# Patient Record
Sex: Female | Born: 1940
Health system: Southern US, Community
[De-identification: ages and names within clinical notes are randomized; demographics above are authoritative.]

## PROBLEM LIST (undated history)

## (undated) DIAGNOSIS — R51 Headache: Secondary | ICD-10-CM

## (undated) DIAGNOSIS — R519 Headache, unspecified: Secondary | ICD-10-CM

## (undated) DIAGNOSIS — M1990 Unspecified osteoarthritis, unspecified site: Secondary | ICD-10-CM

## (undated) DIAGNOSIS — I10 Essential (primary) hypertension: Secondary | ICD-10-CM

## (undated) HISTORY — PX: ABDOMINAL HYSTERECTOMY: SHX81

## (undated) HISTORY — PX: APPENDECTOMY: SHX54

---

## 2000-06-25 ENCOUNTER — Ambulatory Visit (HOSPITAL_COMMUNITY): Admission: RE | Admit: 2000-06-25 | Discharge: 2000-06-25 | Payer: Self-pay | Admitting: Family Medicine

## 2001-09-14 ENCOUNTER — Ambulatory Visit (HOSPITAL_COMMUNITY): Admission: RE | Admit: 2001-09-14 | Discharge: 2001-09-14 | Payer: Self-pay | Admitting: Family Medicine

## 2005-05-30 ENCOUNTER — Emergency Department (HOSPITAL_COMMUNITY): Admission: EM | Admit: 2005-05-30 | Discharge: 2005-05-30 | Payer: Self-pay | Admitting: Emergency Medicine

## 2005-06-11 ENCOUNTER — Ambulatory Visit: Payer: Self-pay | Admitting: Gastroenterology

## 2005-07-03 ENCOUNTER — Ambulatory Visit (HOSPITAL_COMMUNITY): Admission: RE | Admit: 2005-07-03 | Discharge: 2005-07-03 | Payer: Self-pay | Admitting: Gastroenterology

## 2005-07-03 ENCOUNTER — Ambulatory Visit: Payer: Self-pay | Admitting: Gastroenterology

## 2005-07-03 ENCOUNTER — Encounter (INDEPENDENT_AMBULATORY_CARE_PROVIDER_SITE_OTHER): Payer: Self-pay | Admitting: Specialist

## 2005-12-25 ENCOUNTER — Ambulatory Visit (HOSPITAL_COMMUNITY): Admission: RE | Admit: 2005-12-25 | Discharge: 2005-12-25 | Payer: Self-pay | Admitting: Gastroenterology

## 2005-12-30 ENCOUNTER — Ambulatory Visit: Payer: Self-pay | Admitting: Gastroenterology

## 2007-07-02 ENCOUNTER — Ambulatory Visit (HOSPITAL_COMMUNITY): Admission: RE | Admit: 2007-07-02 | Discharge: 2007-07-02 | Payer: Self-pay | Admitting: Internal Medicine

## 2008-07-21 ENCOUNTER — Ambulatory Visit (HOSPITAL_COMMUNITY): Admission: RE | Admit: 2008-07-21 | Discharge: 2008-07-21 | Payer: Self-pay | Admitting: Internal Medicine

## 2009-11-02 ENCOUNTER — Ambulatory Visit (HOSPITAL_COMMUNITY): Admission: RE | Admit: 2009-11-02 | Discharge: 2009-11-02 | Payer: Self-pay | Admitting: Internal Medicine

## 2011-02-12 ENCOUNTER — Other Ambulatory Visit (HOSPITAL_COMMUNITY): Payer: Self-pay | Admitting: Internal Medicine

## 2011-02-12 DIAGNOSIS — Z1231 Encounter for screening mammogram for malignant neoplasm of breast: Secondary | ICD-10-CM

## 2011-02-19 ENCOUNTER — Ambulatory Visit (HOSPITAL_COMMUNITY)
Admission: RE | Admit: 2011-02-19 | Discharge: 2011-02-19 | Disposition: A | Discharge status: Home / Self Care | Payer: Medicare Other | Source: Ambulatory Visit | Attending: Internal Medicine | Admitting: Internal Medicine

## 2011-02-19 DIAGNOSIS — Z1231 Encounter for screening mammogram for malignant neoplasm of breast: Secondary | ICD-10-CM

## 2011-10-25 ENCOUNTER — Ambulatory Visit: Payer: Self-pay | Admitting: Family Medicine

## 2011-10-25 ENCOUNTER — Ambulatory Visit: Payer: Self-pay

## 2011-10-25 VITALS — BP 158/76 | HR 60 | Temp 97.8°F | Resp 16 | Ht 61.0 in | Wt 118.0 lb

## 2011-10-25 DIAGNOSIS — M545 Low back pain, unspecified: Secondary | ICD-10-CM

## 2011-10-25 DIAGNOSIS — M549 Dorsalgia, unspecified: Secondary | ICD-10-CM

## 2011-10-25 DIAGNOSIS — G8929 Other chronic pain: Secondary | ICD-10-CM

## 2011-10-25 LAB — POCT CBC
Granulocyte percent: 47.3 %G (ref 37–80)
HCT, POC: 38.8 % (ref 37.7–47.9)
Hemoglobin: 12.1 g/dL — AB (ref 12.2–16.2)
Lymph, poc: 2.3 (ref 0.6–3.4)
MCH, POC: 29.4 pg (ref 27–31.2)
MCHC: 31.2 g/dL — AB (ref 31.8–35.4)
MCV: 94.3 fL (ref 80–97)
MID (cbc): 0.4 (ref 0–0.9)
MPV: 7.7 fL (ref 0–99.8)
POC Granulocyte: 2.4 (ref 2–6.9)
POC LYMPH PERCENT: 44.5 %L (ref 10–50)
POC MID %: 8.2 %M (ref 0–12)
Platelet Count, POC: 334 10*3/uL (ref 142–424)
RBC: 4.11 M/uL (ref 4.04–5.48)
RDW, POC: 14.6 %
WBC: 5.1 10*3/uL (ref 4.6–10.2)

## 2011-10-25 LAB — POCT UA - MICROSCOPIC ONLY
Bacteria, U Microscopic: NEGATIVE
Casts, Ur, LPF, POC: NEGATIVE
Crystals, Ur, HPF, POC: NEGATIVE
Epithelial cells, urine per micros: NEGATIVE
Mucus, UA: NEGATIVE
Yeast, UA: NEGATIVE

## 2011-10-25 LAB — POCT URINALYSIS DIPSTICK
Bilirubin, UA: NEGATIVE
Glucose, UA: NEGATIVE
Ketones, UA: NEGATIVE
Leukocytes, UA: NEGATIVE
Nitrite, UA: NEGATIVE
Protein, UA: NEGATIVE
Spec Grav, UA: 1.02
Urobilinogen, UA: 0.2
pH, UA: 7

## 2011-10-25 LAB — POCT SEDIMENTATION RATE: POCT SED RATE: 20 mm/hr (ref 0–22)

## 2011-10-25 MED ORDER — MELOXICAM 7.5 MG PO TABS: 7.5000 mg | ORAL_TABLET | Freq: Every day | ORAL | Status: DC

## 2011-10-25 NOTE — Patient Instructions (Signed)
Chronic Back Pain When back pain lasts longer than 3 months, it is called chronic back pain.This pain can be frustrating, but the cause of the pain is rarely dangerous.People with chronic back pain often go through certain periods that are more intense (flare-ups). CAUSES Chronic back pain can be caused by wear and tear (degeneration) on different structures in your back. These structures may include bones, ligaments, or discs. This degeneration may result in more pressure being placed on the nerves that travel to your legs and feet. This can lead to pain traveling from the low back down the back of the legs. When pain lasts longer than 3 months, it is not unusual for people to experience anxiety or depression. Anxiety and depression can also contribute to low back pain. TREATMENT  Establish a regular exercise plan. This is critical to improving your functional level.   Have a self-management plan for when you flare-up. Flare-ups rarely require a medical visit. Regular exercise will help reduce the intensity and frequency of your flare-ups.   Manage how you feel about your back pain and the rest of your life. Anxiety, depression, and feeling that you cannot alter your back pain have been shown to make back pain more intense and debilitating.   Medicines should never be your only treatment. They should be used along with other treatments to help you return to a more active lifestyle.   Procedures such as injections or surgery may be helpful but are rarely necessary. You may be able to get the same results with physical therapy or chiropractic care.  HOME CARE INSTRUCTIONS  Avoid bending, heavy lifting, prolonged sitting, and activities which make the problem worse.   Continue normal activity as much as possible.   Take brief periods of rest throughout the day to reduce your pain during flare-ups.   Follow your back exercise rehabilitation program. This can help reduce symptoms and prevent  more pain.   Only take over-the-counter or prescription medicines as directed by your caregiver. Muscle relaxants are sometimes prescribed. Narcotic pain medicine is discouraged for long-term pain, since addiction is a possible outcome.   If you smoke, quit.   Eat healthy foods and maintain a recommended body weight.  SEEK IMMEDIATE MEDICAL CARE IF:   You have weakness or numbness in one of your legs or feet.   You have trouble controlling your bladder or bowels.   You develop nausea, vomiting, abdominal pain, shortness of breath, or fainting.  Document Released: 07/04/2004 Document Revised: 05/16/2011 Document Reviewed: 05/11/2011 [employer] Patient Information 2012 [employer]. 

## 2011-10-25 NOTE — Progress Notes (Signed)
This is a 71 year old woman, frank a phone, who comes in with her daughter. She is from [place]. 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  Patient has long history of low and mid back pain. Daughters beginning her backwards for years. He has gotten somewhat worse in recent months and they wish to have an evaluation. Patient has some mild abdominal pain now but generally does not have any. No urinary symptoms. No fever or diarrhea or nausea. Aref patient does have a long history of diet-controlled diabetes, and she checks her sugar which generally runs about 115-130. No recent blurry vision but she has had weight loss.  Objective: No acute distress, I noticed that she groans when she sits up from a lying to sitting position.  This is a skinny elderly woman in no acute distress. Her back is nontender and there is no obvious scoliosis. Straight-leg raising is negative.  Hip range of motion normal  Abdomen: Soft nontender without HSM or masses. No enlargement of aorta palpated.  Chest: Clear  Heart: Soft S1 with possible early ejection murmur 1/6, no diastolic murmur, regular, no gallop or rub.  Extremities: Normal skin-no edema, good pedal pulses  [facility] reading (PRIMARY) by  Dr. Milus Glazier:  L-S spines: no significant abnormalities  Assessment:   Chronic persistent and worsening back pain.  In the best of all possible worlds, we would get more evaluation, but the patient is without funds and insurance, working part time at a demanding job.  I have explained that I would like to order a colonoscopy and other tests, but at this point, it makes practical sense to try antiinflammatories.  Plan:

## 2011-12-17 ENCOUNTER — Ambulatory Visit: Payer: Self-pay | Admitting: Family Medicine

## 2011-12-17 VITALS — BP 130/85 | HR 59 | Temp 98.2°F | Resp 16 | Ht 61.25 in | Wt 118.4 lb

## 2011-12-17 DIAGNOSIS — T148XXA Other injury of unspecified body region, initial encounter: Secondary | ICD-10-CM

## 2011-12-17 DIAGNOSIS — M549 Dorsalgia, unspecified: Secondary | ICD-10-CM

## 2011-12-17 MED ORDER — DICLOFENAC SODIUM 75 MG PO TBEC: 75.0000 mg | DELAYED_RELEASE_TABLET | Freq: Two times a day (BID) | ORAL | Status: DC

## 2011-12-17 NOTE — Progress Notes (Signed)
[facility]:  Office Visit  Chief Complaint:  Chief Complaint  Patient presents with  . Neck Pain    x 3 days  radiating to back     HPI: Julie Schmidt is a 71 y.o. 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 She  Has dull aching pain from her neck down to her back. 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 SHe has had this before. Tried MObic but ran out. It helped some.   Past Medical History  Diagnosis Date  . Diabetes mellitus    Past Surgical History  Procedure Date  . Cesarean section    History   Social History  . Marital Status: Single    Spouse Name: N/A    Number of Children: N/A  . Years of Education: N/A   Social History Main Topics  . Smoking status: Never Smoker   . Smokeless tobacco: None  . Alcohol Use: No  . Drug Use: No  . Sexually Active: None   Other Topics Concern  . None   Social History Narrative  . None   Family History  Problem Relation Age of Onset  . Cancer Father    No Known Allergies Prior to Admission medications   Medication Sig Start Date End Date Taking? Authorizing Provider  aspirin 81 MG tablet Take 81 mg by mouth daily.   Yes Historical Provider, MD  meloxicam (MOBIC) 7.5 MG tablet Take 1 tablet (7.5 mg total) by mouth daily. 10/25/11 10/24/12  Elvina Sidle, MD     ROS: The patient denies fevers, chills, night sweats, unintentional weight loss, chest pain, palpitations, wheezing, dyspnea on exertion, nausea, vomiting, abdominal pain, dysuria, hematuria, melena, numbness, weakness, or tingling.   All other systems have been reviewed and were otherwise negative with the exception of those mentioned in the HPI and as above.    PHYSICAL EXAM: Filed Vitals:   12/17/11 1613  BP: 130/85  Pulse: 59  Temp: 98.2 F (36.8 C)  Resp: 16   Filed Vitals:   12/17/11 1613  Height: 5' 1.25" (1.556 m)  Weight: 118 lb 6.4 oz (53.706 kg)   Body mass  index is 22.19 kg/(m^2).  General: Alert, no acute distress HEENT:  Normocephalic, atraumatic, oropharynx patent.  Cardiovascular:  Regular rate and rhythm, no rubs murmurs or gallops.  No Carotid bruits, radial pulse intact. No pedal edema.  Respiratory: Clear to auscultation bilaterally.  No wheezes, rales, or rhonchi.  No cyanosis, no use of accessory musculature GI: No organomegaly, abdomen is soft and non-tender, positive bowel sounds.  No masses. Skin: No rashes. Neurologic: Facial musculature symmetric. Psychiatric: Patient is appropriate throughout our interaction. Lymphatic: No cervical lymphadenopathy Musculoskeletal: Gait intact. + tenderness on palpation along lateral paraspinal msk along C, T, and L-spine   LABS: Results for orders placed in visit on 10/25/11  POCT UA - MICROSCOPIC ONLY      Component Value Range   WBC, Ur, HPF, POC 0-2     RBC, urine, microscopic 0-1     Bacteria, U Microscopic negative     Mucus, UA negative     Epithelial cells, urine per micros negative     Crystals, Ur, HPF, POC negative     Casts, Ur, LPF, POC negative     Yeast, UA negative    POCT URINALYSIS DIPSTICK      Component Value Range  Color, UA yellow     Clarity, UA clear     Glucose, UA negative     Bilirubin, UA negative     Ketones, UA negative     Spec Grav, UA 1.020     Blood, UA trace-intact     pH, UA 7.0     Protein, UA negative     Urobilinogen, UA 0.2     Nitrite, UA negative     Leukocytes, UA Negative    POCT CBC      Component Value Range   WBC 5.1  4.6 - 10.2 K/uL   Lymph, poc 2.3  0.6 - 3.4   POC LYMPH PERCENT 44.5  10 - 50 %L   MID (cbc) 0.4  0 - 0.9   POC MID % 8.2  0 - 12 %M   POC Granulocyte 2.4  2 - 6.9   Granulocyte percent 47.3  37 - 80 %G   RBC 4.11  4.04 - 5.48 M/uL   Hemoglobin 12.1 (*) 12.2 - 16.2 g/dL   HCT, POC 16.1  09.6 - 47.9 %   MCV 94.3  80 - 97 fL   MCH, POC 29.4  27 - 31.2 pg   MCHC 31.2 (*) 31.8 - 35.4 g/dL   RDW, POC 04.5      Platelet Count, POC 334  142 - 424 K/uL   MPV 7.7  0 - 99.8 fL  POCT SEDIMENTATION RATE      Component Value Range   POCT SED RATE 20  0 - 22 mm/hr     EKG/XRAY:   Primary read interpreted by Dr. Conley Rolls at [facility].   ASSESSMENT/PLAN: Encounter Diagnoses  Name Primary?  . Sprain and strain Yes  . Back pain    Secondary to [facility] sprain/strain from work coupled with OA Diclofenac 75 mg BID with food.  F/u in 1 month prn.  Muscle and not bone in nature. Works as Advertising copywriter.      Hamilton Capri PHUONG, DO 12/17/2011 4:51 PM

## 2012-01-23 ENCOUNTER — Other Ambulatory Visit: Payer: Self-pay | Admitting: Family Medicine

## 2012-03-05 ENCOUNTER — Ambulatory Visit (HOSPITAL_COMMUNITY): Payer: Medicare Other | Attending: Internal Medicine

## 2012-03-05 ENCOUNTER — Other Ambulatory Visit (HOSPITAL_COMMUNITY): Payer: Self-pay | Admitting: Internal Medicine

## 2012-03-05 DIAGNOSIS — Z1231 Encounter for screening mammogram for malignant neoplasm of breast: Secondary | ICD-10-CM

## 2012-03-17 ENCOUNTER — Ambulatory Visit (HOSPITAL_COMMUNITY)
Admission: RE | Admit: 2012-03-17 | Discharge: 2012-03-17 | Disposition: A | Discharge status: Home / Self Care | Payer: Medicare Other | Source: Ambulatory Visit | Attending: Internal Medicine | Admitting: Internal Medicine

## 2012-03-17 DIAGNOSIS — Z1231 Encounter for screening mammogram for malignant neoplasm of breast: Secondary | ICD-10-CM | POA: Insufficient documentation

## 2013-02-23 ENCOUNTER — Other Ambulatory Visit (HOSPITAL_COMMUNITY): Payer: Self-pay | Admitting: Internal Medicine

## 2013-02-23 DIAGNOSIS — Z1231 Encounter for screening mammogram for malignant neoplasm of breast: Secondary | ICD-10-CM

## 2013-03-22 ENCOUNTER — Ambulatory Visit (HOSPITAL_COMMUNITY)
Admission: RE | Admit: 2013-03-22 | Discharge: 2013-03-22 | Disposition: A | Discharge status: Home / Self Care | Payer: Medicare Other | Source: Ambulatory Visit | Attending: Internal Medicine | Admitting: Internal Medicine

## 2013-03-22 DIAGNOSIS — Z1231 Encounter for screening mammogram for malignant neoplasm of breast: Secondary | ICD-10-CM | POA: Diagnosis not present

## 2014-01-05 ENCOUNTER — Other Ambulatory Visit: Payer: Self-pay | Admitting: Obstetrics and Gynecology

## 2014-01-05 DIAGNOSIS — Z1289 Encounter for screening for malignant neoplasm of other sites: Secondary | ICD-10-CM | POA: Diagnosis not present

## 2014-01-07 LAB — CYTOLOGY - PAP

## 2014-03-24 ENCOUNTER — Emergency Department (HOSPITAL_COMMUNITY): Payer: No Typology Code available for payment source

## 2014-03-24 ENCOUNTER — Encounter (HOSPITAL_COMMUNITY): Payer: Self-pay | Admitting: Emergency Medicine

## 2014-03-24 ENCOUNTER — Emergency Department (HOSPITAL_COMMUNITY)
Admission: EM | Admit: 2014-03-24 | Discharge: 2014-03-24 | Disposition: A | Discharge status: Home / Self Care | Payer: No Typology Code available for payment source | Attending: Emergency Medicine | Admitting: Emergency Medicine

## 2014-03-24 DIAGNOSIS — S3992XA Unspecified injury of lower back, initial encounter: Secondary | ICD-10-CM | POA: Insufficient documentation

## 2014-03-24 DIAGNOSIS — Z7982 Long term (current) use of aspirin: Secondary | ICD-10-CM | POA: Insufficient documentation

## 2014-03-24 DIAGNOSIS — M7989 Other specified soft tissue disorders: Secondary | ICD-10-CM | POA: Diagnosis not present

## 2014-03-24 DIAGNOSIS — M542 Cervicalgia: Secondary | ICD-10-CM | POA: Diagnosis not present

## 2014-03-24 DIAGNOSIS — Z791 Long term (current) use of non-steroidal anti-inflammatories (NSAID): Secondary | ICD-10-CM | POA: Insufficient documentation

## 2014-03-24 DIAGNOSIS — S199XXA Unspecified injury of neck, initial encounter: Secondary | ICD-10-CM | POA: Insufficient documentation

## 2014-03-24 DIAGNOSIS — S299XXA Unspecified injury of thorax, initial encounter: Secondary | ICD-10-CM | POA: Diagnosis not present

## 2014-03-24 DIAGNOSIS — R400 Somnolence: Secondary | ICD-10-CM | POA: Diagnosis not present

## 2014-03-24 DIAGNOSIS — Y92410 Unspecified street and highway as the place of occurrence of the external cause: Secondary | ICD-10-CM | POA: Diagnosis not present

## 2014-03-24 DIAGNOSIS — E119 Type 2 diabetes mellitus without complications: Secondary | ICD-10-CM | POA: Diagnosis not present

## 2014-03-24 DIAGNOSIS — M545 Low back pain: Secondary | ICD-10-CM | POA: Diagnosis not present

## 2014-03-24 DIAGNOSIS — R222 Localized swelling, mass and lump, trunk: Secondary | ICD-10-CM | POA: Diagnosis not present

## 2014-03-24 DIAGNOSIS — R51 Headache: Secondary | ICD-10-CM | POA: Diagnosis not present

## 2014-03-24 DIAGNOSIS — S59911A Unspecified injury of right forearm, initial encounter: Secondary | ICD-10-CM | POA: Insufficient documentation

## 2014-03-24 DIAGNOSIS — S0990XA Unspecified injury of head, initial encounter: Secondary | ICD-10-CM | POA: Diagnosis not present

## 2014-03-24 DIAGNOSIS — D329 Benign neoplasm of meninges, unspecified: Secondary | ICD-10-CM

## 2014-03-24 DIAGNOSIS — M79631 Pain in right forearm: Secondary | ICD-10-CM | POA: Diagnosis not present

## 2014-03-24 DIAGNOSIS — R1110 Vomiting, unspecified: Secondary | ICD-10-CM | POA: Diagnosis not present

## 2014-03-24 DIAGNOSIS — R079 Chest pain, unspecified: Secondary | ICD-10-CM | POA: Diagnosis not present

## 2014-03-24 DIAGNOSIS — Y9389 Activity, other specified: Secondary | ICD-10-CM | POA: Insufficient documentation

## 2014-03-24 DIAGNOSIS — S3982XA Other specified injuries of lower back, initial encounter: Secondary | ICD-10-CM | POA: Diagnosis not present

## 2014-03-24 LAB — CBC WITH DIFFERENTIAL/PLATELET
Basophils Absolute: 0 10*3/uL (ref 0.0–0.1)
Basophils Relative: 0 % (ref 0–1)
EOS ABS: 0.2 10*3/uL (ref 0.0–0.7)
EOS PCT: 3 % (ref 0–5)
HCT: 37.9 % (ref 36.0–46.0)
HEMOGLOBIN: 12.3 g/dL (ref 12.0–15.0)
LYMPHS ABS: 1.8 10*3/uL (ref 0.7–4.0)
LYMPHS PCT: 29 % (ref 12–46)
MCH: 29.7 pg (ref 26.0–34.0)
MCHC: 32.5 g/dL (ref 30.0–36.0)
MCV: 91.5 fL (ref 78.0–100.0)
MONO ABS: 0.3 10*3/uL (ref 0.1–1.0)
Monocytes Relative: 4 % (ref 3–12)
Neutro Abs: 3.9 10*3/uL (ref 1.7–7.7)
Neutrophils Relative %: 64 % (ref 43–77)
Platelets: 300 10*3/uL (ref 150–400)
RBC: 4.14 MIL/uL (ref 3.87–5.11)
RDW: 12.4 % (ref 11.5–15.5)
WBC: 6.1 10*3/uL (ref 4.0–10.5)

## 2014-03-24 LAB — I-STAT CHEM 8, ED
BUN: 12 mg/dL (ref 6–23)
CALCIUM ION: 1.15 mmol/L (ref 1.13–1.30)
CREATININE: 0.7 mg/dL (ref 0.50–1.10)
Chloride: 104 mEq/L (ref 96–112)
GLUCOSE: 98 mg/dL (ref 70–99)
HCT: 40 % (ref 36.0–46.0)
HEMOGLOBIN: 13.6 g/dL (ref 12.0–15.0)
Potassium: 4 mEq/L (ref 3.7–5.3)
Sodium: 141 mEq/L (ref 137–147)
TCO2: 26 mmol/L (ref 0–100)

## 2014-03-24 LAB — PROTIME-INR
INR: 1.02 (ref 0.00–1.49)
Prothrombin Time: 13.5 seconds (ref 11.6–15.2)

## 2014-03-24 MED ORDER — ONDANSETRON 4 MG PO TBDP
4.0000 mg | ORAL_TABLET | Freq: Once | ORAL | Status: AC
  Administered 2014-03-24: 4 mg via ORAL
  Filled 2014-03-24: qty 1

## 2014-03-24 MED ORDER — ONDANSETRON HCL 4 MG PO TABS: 4.0000 mg | ORAL_TABLET | Freq: Four times a day (QID) | ORAL | Status: DC

## 2014-03-24 MED ORDER — DEXAMETHASONE 4 MG PO TABS: 4.0000 mg | ORAL_TABLET | Freq: Two times a day (BID) | ORAL | Status: DC

## 2014-03-24 MED ORDER — HYDROCODONE-ACETAMINOPHEN 5-325 MG PO TABS: 2.0000 | ORAL_TABLET | Freq: Four times a day (QID) | ORAL | Status: DC | PRN

## 2014-03-24 MED ORDER — HYDROCODONE-ACETAMINOPHEN 5-325 MG PO TABS
1.0000 | ORAL_TABLET | Freq: Once | ORAL | Status: AC
  Administered 2014-03-24: 1 via ORAL
  Filled 2014-03-24: qty 1
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
## 2014-03-29 ENCOUNTER — Other Ambulatory Visit: Payer: Self-pay | Admitting: Internal Medicine

## 2014-03-29 DIAGNOSIS — Z1231 Encounter for screening mammogram for malignant neoplasm of breast: Secondary | ICD-10-CM

## 2014-03-29 DIAGNOSIS — Z23 Encounter for immunization: Secondary | ICD-10-CM | POA: Diagnosis not present

## 2014-04-07 ENCOUNTER — Other Ambulatory Visit: Payer: Self-pay | Admitting: Neurosurgery

## 2014-04-07 DIAGNOSIS — D329 Benign neoplasm of meninges, unspecified: Secondary | ICD-10-CM

## 2014-04-08 ENCOUNTER — Ambulatory Visit (HOSPITAL_COMMUNITY)
Admission: RE | Admit: 2014-04-08 | Discharge: 2014-04-08 | Disposition: A | Discharge status: Home / Self Care | Payer: Medicare Other | Source: Ambulatory Visit | Attending: Internal Medicine | Admitting: Internal Medicine

## 2014-04-08 DIAGNOSIS — Z1231 Encounter for screening mammogram for malignant neoplasm of breast: Secondary | ICD-10-CM | POA: Insufficient documentation

## 2014-04-19 ENCOUNTER — Ambulatory Visit
Admission: RE | Admit: 2014-04-19 | Discharge: 2014-04-19 | Disposition: A | Discharge status: Home / Self Care | Payer: Medicare Other | Source: Ambulatory Visit | Attending: Neurosurgery | Admitting: Neurosurgery

## 2014-04-19 DIAGNOSIS — D329 Benign neoplasm of meninges, unspecified: Secondary | ICD-10-CM | POA: Diagnosis not present

## 2014-04-19 DIAGNOSIS — S069X9A Unspecified intracranial injury with loss of consciousness of unspecified duration, initial encounter: Secondary | ICD-10-CM | POA: Diagnosis not present

## 2014-04-19 DIAGNOSIS — I6782 Cerebral ischemia: Secondary | ICD-10-CM | POA: Diagnosis not present

## 2014-04-19 DIAGNOSIS — G936 Cerebral edema: Secondary | ICD-10-CM | POA: Diagnosis not present

## 2014-04-19 MED ORDER — GADOBENATE DIMEGLUMINE 529 MG/ML IV SOLN
12.0000 mL | Freq: Once | INTRAVENOUS | Status: AC | PRN
  Administered 2014-04-19: 12 mL via INTRAVENOUS

## 2014-05-04 DIAGNOSIS — D329 Benign neoplasm of meninges, unspecified: Secondary | ICD-10-CM | POA: Diagnosis not present

## 2014-05-04 DIAGNOSIS — E119 Type 2 diabetes mellitus without complications: Secondary | ICD-10-CM | POA: Diagnosis not present

## 2014-05-09 ENCOUNTER — Other Ambulatory Visit: Payer: Self-pay | Admitting: Neurosurgery

## 2014-05-09 DIAGNOSIS — D329 Benign neoplasm of meninges, unspecified: Secondary | ICD-10-CM

## 2014-05-10 DIAGNOSIS — D329 Benign neoplasm of meninges, unspecified: Secondary | ICD-10-CM | POA: Diagnosis not present

## 2014-05-12 ENCOUNTER — Ambulatory Visit
Admission: RE | Admit: 2014-05-12 | Discharge: 2014-05-12 | Disposition: A | Discharge status: Home / Self Care | Payer: Medicare Other | Source: Ambulatory Visit | Attending: Neurosurgery | Admitting: Neurosurgery

## 2014-05-12 DIAGNOSIS — D329 Benign neoplasm of meninges, unspecified: Secondary | ICD-10-CM

## 2014-05-12 MED ORDER — GADOBENATE DIMEGLUMINE 529 MG/ML IV SOLN
11.0000 mL | Freq: Once | INTRAVENOUS | Status: AC | PRN
  Administered 2014-05-12: 11 mL via INTRAVENOUS

## 2014-05-16 ENCOUNTER — Other Ambulatory Visit: Payer: Self-pay | Admitting: Neurosurgery

## 2014-05-17 ENCOUNTER — Encounter (HOSPITAL_COMMUNITY): Payer: Self-pay

## 2014-05-17 ENCOUNTER — Encounter (HOSPITAL_COMMUNITY)
Admission: RE | Admit: 2014-05-17 | Discharge: 2014-05-17 | Disposition: A | Discharge status: Home / Self Care | Payer: Medicare Other | Source: Ambulatory Visit | Attending: Neurosurgery | Admitting: Neurosurgery

## 2014-05-17 HISTORY — DX: Headache, unspecified: R51.9

## 2014-05-17 HISTORY — DX: Unspecified osteoarthritis, unspecified site: M19.90

## 2014-05-17 HISTORY — DX: Headache: R51

## 2014-05-17 NOTE — Progress Notes (Signed)
Call to pt. & daughter multiple times. Phone connection very bad on two occasions.  Finally established a reasonable connection & still, accent  [place]- daughter reports ) made the [facility] taking a challenge.

## 2014-05-18 MED ORDER — CEFAZOLIN SODIUM-DEXTROSE 2-3 GM-% IV SOLR
2.0000 g | INTRAVENOUS | Status: AC
  Administered 2014-05-19: 2 g via INTRAVENOUS
  Filled 2014-05-18: qty 50

## 2014-05-19 ENCOUNTER — Encounter (HOSPITAL_COMMUNITY)
Admission: RE | Disposition: A | Discharge status: Home / Self Care | Payer: Self-pay | Source: Ambulatory Visit | Attending: Neurosurgery

## 2014-05-19 ENCOUNTER — Inpatient Hospital Stay (HOSPITAL_COMMUNITY): Payer: Medicare Other | Admitting: Certified Registered Nurse Anesthetist

## 2014-05-19 ENCOUNTER — Inpatient Hospital Stay (HOSPITAL_COMMUNITY)
Admission: RE | Admit: 2014-05-19 | Discharge: 2014-05-22 | DRG: 025 | Disposition: A | Discharge status: Home / Self Care | Payer: Medicare Other | Source: Ambulatory Visit | Attending: Neurosurgery | Admitting: Neurosurgery

## 2014-05-19 ENCOUNTER — Encounter (HOSPITAL_COMMUNITY): Payer: Self-pay | Admitting: Certified Registered Nurse Anesthetist

## 2014-05-19 DIAGNOSIS — E119 Type 2 diabetes mellitus without complications: Secondary | ICD-10-CM | POA: Diagnosis present

## 2014-05-19 DIAGNOSIS — M1990 Unspecified osteoarthritis, unspecified site: Secondary | ICD-10-CM | POA: Diagnosis not present

## 2014-05-19 DIAGNOSIS — G936 Cerebral edema: Secondary | ICD-10-CM | POA: Diagnosis present

## 2014-05-19 DIAGNOSIS — R51 Headache: Secondary | ICD-10-CM | POA: Diagnosis present

## 2014-05-19 DIAGNOSIS — D320 Benign neoplasm of cerebral meninges: Secondary | ICD-10-CM | POA: Diagnosis not present

## 2014-05-19 DIAGNOSIS — D329 Benign neoplasm of meninges, unspecified: Secondary | ICD-10-CM | POA: Diagnosis not present

## 2014-05-19 DIAGNOSIS — D332 Benign neoplasm of brain, unspecified: Secondary | ICD-10-CM | POA: Diagnosis not present

## 2014-05-19 HISTORY — PX: BRAIN SURGERY: SHX531

## 2014-05-19 HISTORY — PX: CRANIOTOMY: SHX93

## 2014-05-19 LAB — BASIC METABOLIC PANEL
Anion gap: 14 (ref 5–15)
BUN: 10 mg/dL (ref 6–23)
CALCIUM: 9.7 mg/dL (ref 8.4–10.5)
CO2: 25 mEq/L (ref 19–32)
Chloride: 103 mEq/L (ref 96–112)
Creatinine, Ser: 0.74 mg/dL (ref 0.50–1.10)
GFR, EST NON AFRICAN AMERICAN: 84 mL/min — AB (ref >90)
Glucose, Bld: 106 mg/dL — ABNORMAL HIGH (ref 70–99)
POTASSIUM: 3.8 meq/L (ref 3.7–5.3)
Sodium: 142 mEq/L (ref 137–147)

## 2014-05-19 LAB — SURGICAL PCR SCREEN
MRSA, PCR: NEGATIVE
Staphylococcus aureus: NEGATIVE

## 2014-05-19 LAB — CBC
HCT: 38.4 % (ref 36.0–46.0)
Hemoglobin: 12.4 g/dL (ref 12.0–15.0)
MCH: 30.2 pg (ref 26.0–34.0)
MCHC: 32.3 g/dL (ref 30.0–36.0)
MCV: 93.4 fL (ref 78.0–100.0)
PLATELETS: 329 10*3/uL (ref 150–400)
RBC: 4.11 MIL/uL (ref 3.87–5.11)
RDW: 12.4 % (ref 11.5–15.5)
WBC: 5.1 10*3/uL (ref 4.0–10.5)

## 2014-05-19 LAB — ABO/RH: ABO/RH(D): O POS

## 2014-05-19 LAB — GLUCOSE, CAPILLARY: GLUCOSE-CAPILLARY: 124 mg/dL — AB (ref 70–99)

## 2014-05-19 LAB — TYPE AND SCREEN
ABO/RH(D): O POS
Antibody Screen: NEGATIVE

## 2014-05-19 SURGERY — CRANIOTOMY TUMOR EXCISION
Anesthesia: General | Site: Head | Laterality: Left

## 2014-05-19 MED ORDER — GLYCOPYRROLATE 0.2 MG/ML IJ SOLN
INTRAMUSCULAR | Status: DC | PRN
  Administered 2014-05-19: 0.6 mg via INTRAVENOUS

## 2014-05-19 MED ORDER — MUPIROCIN 2 % EX OINT
TOPICAL_OINTMENT | CUTANEOUS | Status: AC
  Filled 2014-05-19: qty 22

## 2014-05-19 MED ORDER — DEXAMETHASONE SODIUM PHOSPHATE 10 MG/ML IJ SOLN
6.0000 mg | Freq: Four times a day (QID) | INTRAMUSCULAR | Status: AC
  Administered 2014-05-19 – 2014-05-20 (×4): 6 mg via INTRAVENOUS
  Filled 2014-05-19 (×2): qty 1
  Filled 2014-05-19: qty 0.6
  Filled 2014-05-19: qty 1

## 2014-05-19 MED ORDER — SENNA 8.6 MG PO TABS
1.0000 | ORAL_TABLET | Freq: Two times a day (BID) | ORAL | Status: DC
  Administered 2014-05-20 – 2014-05-22 (×4): 8.6 mg via ORAL
  Filled 2014-05-19 (×7): qty 1

## 2014-05-19 MED ORDER — LIDOCAINE HCL 4 % MT SOLN
OROMUCOSAL | Status: DC | PRN
  Administered 2014-05-19: 4 mL via TOPICAL

## 2014-05-19 MED ORDER — ALBUMIN HUMAN 5 % IV SOLN
INTRAVENOUS | Status: DC | PRN
  Administered 2014-05-19: 14:00:00 via INTRAVENOUS

## 2014-05-19 MED ORDER — ONDANSETRON HCL 4 MG/2ML IJ SOLN
INTRAMUSCULAR | Status: AC
  Filled 2014-05-19: qty 2

## 2014-05-19 MED ORDER — NEOSTIGMINE METHYLSULFATE 10 MG/10ML IV SOLN
INTRAVENOUS | Status: AC
  Filled 2014-05-19: qty 1

## 2014-05-19 MED ORDER — GLYCOPYRROLATE 0.2 MG/ML IJ SOLN
INTRAMUSCULAR | Status: AC
  Filled 2014-05-19: qty 3

## 2014-05-19 MED ORDER — ARTIFICIAL TEARS OP OINT
TOPICAL_OINTMENT | OPHTHALMIC | Status: DC | PRN
  Administered 2014-05-19: 1 via OPHTHALMIC

## 2014-05-19 MED ORDER — LABETALOL HCL 5 MG/ML IV SOLN: 10.0000 mg | INTRAVENOUS | Status: DC | PRN

## 2014-05-19 MED ORDER — FLEET ENEMA 7-19 GM/118ML RE ENEM
1.0000 | ENEMA | Freq: Once | RECTAL | Status: AC | PRN
  Filled 2014-05-19: qty 1

## 2014-05-19 MED ORDER — LEVETIRACETAM IN NACL 500 MG/100ML IV SOLN
500.0000 mg | Freq: Two times a day (BID) | INTRAVENOUS | Status: DC
  Administered 2014-05-19 – 2014-05-21 (×4): 500 mg via INTRAVENOUS
  Filled 2014-05-19 (×6): qty 100

## 2014-05-19 MED ORDER — BISACODYL 10 MG RE SUPP: 10.0000 mg | Freq: Every day | RECTAL | Status: DC | PRN

## 2014-05-19 MED ORDER — SENNOSIDES-DOCUSATE SODIUM 8.6-50 MG PO TABS
1.0000 | ORAL_TABLET | Freq: Every evening | ORAL | Status: DC | PRN
  Filled 2014-05-19: qty 1

## 2014-05-19 MED ORDER — SODIUM CHLORIDE 0.9 % IJ SOLN
INTRAMUSCULAR | Status: AC
  Filled 2014-05-19: qty 10

## 2014-05-19 MED ORDER — ACETAMINOPHEN 325 MG PO TABS
650.0000 mg | ORAL_TABLET | ORAL | Status: DC | PRN
  Administered 2014-05-21 – 2014-05-22 (×2): 650 mg via ORAL
  Filled 2014-05-19 (×2): qty 2

## 2014-05-19 MED ORDER — BUPIVACAINE HCL (PF) 0.5 % IJ SOLN
INTRAMUSCULAR | Status: DC | PRN
  Administered 2014-05-19: 7 mL

## 2014-05-19 MED ORDER — MIDAZOLAM HCL 2 MG/2ML IJ SOLN
INTRAMUSCULAR | Status: AC
  Filled 2014-05-19: qty 2

## 2014-05-19 MED ORDER — HYDROCODONE-ACETAMINOPHEN 5-325 MG PO TABS
1.0000 | ORAL_TABLET | ORAL | Status: DC | PRN
  Administered 2014-05-20 – 2014-05-21 (×2): 1 via ORAL
  Filled 2014-05-19 (×2): qty 1

## 2014-05-19 MED ORDER — LACTATED RINGERS IV SOLN
INTRAVENOUS | Status: DC | PRN
  Administered 2014-05-19 (×3): via INTRAVENOUS

## 2014-05-19 MED ORDER — POTASSIUM CHLORIDE IN NACL 20-0.9 MEQ/L-% IV SOLN
INTRAVENOUS | Status: DC
  Administered 2014-05-19 – 2014-05-21 (×3): via INTRAVENOUS
  Filled 2014-05-19 (×5): qty 1000

## 2014-05-19 MED ORDER — 0.9 % SODIUM CHLORIDE (POUR BTL) OPTIME
TOPICAL | Status: DC | PRN
  Administered 2014-05-19 (×3): 1000 mL

## 2014-05-19 MED ORDER — LIDOCAINE HCL (CARDIAC) 20 MG/ML IV SOLN
INTRAVENOUS | Status: DC | PRN
  Administered 2014-05-19: 80 mg via INTRAVENOUS

## 2014-05-19 MED ORDER — PROPOFOL 10 MG/ML IV BOLUS
INTRAVENOUS | Status: AC
  Filled 2014-05-19: qty 20

## 2014-05-19 MED ORDER — BACITRACIN ZINC 500 UNIT/GM EX OINT
TOPICAL_OINTMENT | CUTANEOUS | Status: DC | PRN
  Administered 2014-05-19: 1 via TOPICAL

## 2014-05-19 MED ORDER — LIDOCAINE-EPINEPHRINE 1 %-1:100000 IJ SOLN
INTRAMUSCULAR | Status: DC | PRN
  Administered 2014-05-19: 7 mL

## 2014-05-19 MED ORDER — NEOSTIGMINE METHYLSULFATE 10 MG/10ML IV SOLN
INTRAVENOUS | Status: DC | PRN
  Administered 2014-05-19: 4 mg via INTRAVENOUS

## 2014-05-19 MED ORDER — CEFAZOLIN SODIUM 1-5 GM-% IV SOLN
1.0000 g | Freq: Three times a day (TID) | INTRAVENOUS | Status: AC
  Administered 2014-05-19 – 2014-05-20 (×2): 1 g via INTRAVENOUS
  Filled 2014-05-19 (×2): qty 50

## 2014-05-19 MED ORDER — DEXAMETHASONE SODIUM PHOSPHATE 4 MG/ML IJ SOLN
4.0000 mg | Freq: Three times a day (TID) | INTRAMUSCULAR | Status: DC
  Administered 2014-05-21 – 2014-05-22 (×2): 4 mg via INTRAVENOUS
  Filled 2014-05-19 (×4): qty 1

## 2014-05-19 MED ORDER — PROPOFOL 10 MG/ML IV BOLUS
INTRAVENOUS | Status: DC | PRN
  Administered 2014-05-19: 150 mg via INTRAVENOUS
  Administered 2014-05-19: 50 mg via INTRAVENOUS

## 2014-05-19 MED ORDER — FENTANYL CITRATE 0.05 MG/ML IJ SOLN
INTRAMUSCULAR | Status: DC | PRN
  Administered 2014-05-19: 100 ug via INTRAVENOUS

## 2014-05-19 MED ORDER — ONDANSETRON HCL 4 MG PO TABS
4.0000 mg | ORAL_TABLET | ORAL | Status: DC | PRN
  Administered 2014-05-19: 4 mg via ORAL
  Filled 2014-05-19: qty 1

## 2014-05-19 MED ORDER — ASPIRIN EC 81 MG PO TBEC
81.0000 mg | DELAYED_RELEASE_TABLET | Freq: Every day | ORAL | Status: DC
  Administered 2014-05-19 – 2014-05-22 (×4): 81 mg via ORAL
  Filled 2014-05-19 (×4): qty 1

## 2014-05-19 MED ORDER — DEXAMETHASONE SODIUM PHOSPHATE 4 MG/ML IJ SOLN
4.0000 mg | Freq: Four times a day (QID) | INTRAMUSCULAR | Status: AC
  Administered 2014-05-20 – 2014-05-21 (×4): 4 mg via INTRAVENOUS
  Filled 2014-05-19 (×4): qty 1

## 2014-05-19 MED ORDER — LACTATED RINGERS IV SOLN
INTRAVENOUS | Status: DC
  Administered 2014-05-19: 10:00:00 via INTRAVENOUS

## 2014-05-19 MED ORDER — ACETAMINOPHEN 650 MG RE SUPP: 650.0000 mg | RECTAL | Status: DC | PRN

## 2014-05-19 MED ORDER — FENTANYL CITRATE 0.05 MG/ML IJ SOLN
INTRAMUSCULAR | Status: AC
  Filled 2014-05-19: qty 5

## 2014-05-19 MED ORDER — HYDRALAZINE HCL 20 MG/ML IJ SOLN
INTRAMUSCULAR | Status: AC
  Filled 2014-05-19: qty 1

## 2014-05-19 MED ORDER — FENTANYL CITRATE 0.05 MG/ML IJ SOLN
25.0000 ug | INTRAMUSCULAR | Status: DC | PRN
  Administered 2014-05-19 (×2): 25 ug via INTRAVENOUS
  Administered 2014-05-19: 50 ug via INTRAVENOUS

## 2014-05-19 MED ORDER — DEXAMETHASONE SODIUM PHOSPHATE 10 MG/ML IJ SOLN
INTRAMUSCULAR | Status: DC | PRN
  Administered 2014-05-19: 10 mg via INTRAVENOUS

## 2014-05-19 MED ORDER — THROMBIN 5000 UNITS EX SOLR
OROMUCOSAL | Status: DC | PRN
  Administered 2014-05-19: 14:00:00 via TOPICAL

## 2014-05-19 MED ORDER — DEXAMETHASONE SODIUM PHOSPHATE 10 MG/ML IJ SOLN
INTRAMUSCULAR | Status: AC
  Filled 2014-05-19: qty 1

## 2014-05-19 MED ORDER — ONDANSETRON HCL 4 MG/2ML IJ SOLN
INTRAMUSCULAR | Status: DC | PRN
Start: 2014-05-19 — End: 2014-05-19
  Administered 2014-05-19: 4 mg via INTRAVENOUS

## 2014-05-19 MED ORDER — ARTIFICIAL TEARS OP OINT
TOPICAL_OINTMENT | OPHTHALMIC | Status: AC
  Filled 2014-05-19: qty 3.5

## 2014-05-19 MED ORDER — ROCURONIUM BROMIDE 50 MG/5ML IV SOLN
INTRAVENOUS | Status: AC
  Filled 2014-05-19: qty 1

## 2014-05-19 MED ORDER — HYDROCODONE-ACETAMINOPHEN 5-325 MG PO TABS
2.0000 | ORAL_TABLET | Freq: Four times a day (QID) | ORAL | Status: DC | PRN
  Administered 2014-05-22 (×2): 2 via ORAL
  Filled 2014-05-19 (×2): qty 2

## 2014-05-19 MED ORDER — THROMBIN 20000 UNITS EX SOLR
CUTANEOUS | Status: DC | PRN
  Administered 2014-05-19: 14:00:00 via TOPICAL

## 2014-05-19 MED ORDER — DEXAMETHASONE 4 MG PO TABS
4.0000 mg | ORAL_TABLET | Freq: Two times a day (BID) | ORAL | Status: DC
Start: 2014-05-19 — End: 2014-05-19

## 2014-05-19 MED ORDER — MICROFIBRILLAR COLL HEMOSTAT EX PADS
MEDICATED_PAD | CUTANEOUS | Status: DC | PRN
  Administered 2014-05-19: 1 via TOPICAL

## 2014-05-19 MED ORDER — MORPHINE SULFATE 2 MG/ML IJ SOLN
1.0000 mg | INTRAMUSCULAR | Status: DC | PRN
  Administered 2014-05-19 – 2014-05-21 (×3): 2 mg via INTRAVENOUS
  Filled 2014-05-19 (×3): qty 1

## 2014-05-19 MED ORDER — PANTOPRAZOLE SODIUM 40 MG IV SOLR
40.0000 mg | Freq: Every day | INTRAVENOUS | Status: DC
  Administered 2014-05-19 – 2014-05-21 (×3): 40 mg via INTRAVENOUS
  Filled 2014-05-19 (×4): qty 40

## 2014-05-19 MED ORDER — LABETALOL HCL 5 MG/ML IV SOLN
INTRAVENOUS | Status: DC | PRN
  Administered 2014-05-19 (×4): 5 mg via INTRAVENOUS

## 2014-05-19 MED ORDER — MUPIROCIN 2 % EX OINT
TOPICAL_OINTMENT | Freq: Once | CUTANEOUS | Status: DC
  Filled 2014-05-19: qty 22

## 2014-05-19 MED ORDER — PROMETHAZINE HCL 25 MG PO TABS
12.5000 mg | ORAL_TABLET | ORAL | Status: DC | PRN
  Administered 2014-05-19: 12.5 mg via ORAL
  Filled 2014-05-19: qty 1

## 2014-05-19 MED ORDER — ROCURONIUM BROMIDE 100 MG/10ML IV SOLN
INTRAVENOUS | Status: DC | PRN
  Administered 2014-05-19: 40 mg via INTRAVENOUS
  Administered 2014-05-19: 10 mg via INTRAVENOUS

## 2014-05-19 MED ORDER — HYDRALAZINE HCL 20 MG/ML IJ SOLN
5.0000 mg | Freq: Once | INTRAMUSCULAR | Status: AC
  Administered 2014-05-19: 5 mg via INTRAVENOUS

## 2014-05-19 MED ORDER — FENTANYL CITRATE 0.05 MG/ML IJ SOLN
INTRAMUSCULAR | Status: AC
  Filled 2014-05-19: qty 2

## 2014-05-19 MED ORDER — ONDANSETRON HCL 4 MG PO TABS: 4.0000 mg | ORAL_TABLET | Freq: Four times a day (QID) | ORAL | Status: DC

## 2014-05-19 MED ORDER — PHENYLEPHRINE HCL 10 MG/ML IJ SOLN
10.0000 mg | INTRAVENOUS | Status: DC | PRN
  Administered 2014-05-19: 10 ug/min via INTRAVENOUS

## 2014-05-19 MED ORDER — DOCUSATE SODIUM 100 MG PO CAPS
100.0000 mg | ORAL_CAPSULE | Freq: Two times a day (BID) | ORAL | Status: DC
  Administered 2014-05-20 – 2014-05-22 (×4): 100 mg via ORAL
  Filled 2014-05-19 (×7): qty 1

## 2014-05-19 MED ORDER — LIDOCAINE HCL (CARDIAC) 20 MG/ML IV SOLN
INTRAVENOUS | Status: AC
  Filled 2014-05-19: qty 5

## 2014-05-19 MED ORDER — ONDANSETRON HCL 4 MG/2ML IJ SOLN
4.0000 mg | INTRAMUSCULAR | Status: DC | PRN
  Administered 2014-05-19: 4 mg via INTRAVENOUS
  Filled 2014-05-19 (×2): qty 2

## 2014-05-19 MED ORDER — LABETALOL HCL 5 MG/ML IV SOLN
INTRAVENOUS | Status: AC
  Filled 2014-05-19: qty 4

## 2014-05-19 SURGICAL SUPPLY — 88 items
BANDAGE GAUZE 4  KLING STR (GAUZE/BANDAGES/DRESSINGS) ×6 IMPLANT
BIT DRILL WIRE PASS 1.3MM (BIT) IMPLANT
BLADE CLIPPER SURG (BLADE) ×3 IMPLANT
BRUSH SCRUB EZ 1% IODOPHOR (MISCELLANEOUS) ×3 IMPLANT
BRUSH SCRUB EZ PLAIN DRY (MISCELLANEOUS) ×3 IMPLANT
BUR ACORN 6.0 PRECISION (BURR) ×2 IMPLANT
BUR ACORN 6.0MM PRECISION (BURR) ×1
BUR ADDG 1.1 (BURR) IMPLANT
BUR ADDG 1.1MM (BURR)
BUR ROUTER D-58 CRANI (BURR) ×3 IMPLANT
CANISTER SUCT 3000ML (MISCELLANEOUS) ×3 IMPLANT
CLIP TI MEDIUM 6 (CLIP) IMPLANT
CONT SPEC 4OZ CLIKSEAL STRL BL (MISCELLANEOUS) ×6 IMPLANT
COVER MAYO STAND STRL (DRAPES) IMPLANT
DECANTER SPIKE VIAL GLASS SM (MISCELLANEOUS) ×3 IMPLANT
DRAIN SNY WOU 7FLT (WOUND CARE) IMPLANT
DRAPE MICROSCOPE LEICA (MISCELLANEOUS) IMPLANT
DRAPE NEUROLOGICAL W/INCISE (DRAPES) ×3 IMPLANT
DRAPE STERI IOBAN 125X83 (DRAPES) IMPLANT
DRAPE WARM FLUID 44X44 (DRAPE) ×3 IMPLANT
DRILL WIRE PASS 1.3MM (BIT)
DRSG OPSITE 4X5.5 SM (GAUZE/BANDAGES/DRESSINGS) ×4 IMPLANT
DRSG TELFA 3X8 NADH (GAUZE/BANDAGES/DRESSINGS) ×6 IMPLANT
DURAGUARD 06CMX08CM ×2 IMPLANT
DURAPREP 6ML APPLICATOR 50/CS (WOUND CARE) ×3 IMPLANT
ELECT REM PT RETURN 9FT ADLT (ELECTROSURGICAL) ×3
ELECTRODE REM PT RTRN 9FT ADLT (ELECTROSURGICAL) ×1 IMPLANT
EVACUATOR 1/8 PVC DRAIN (DRAIN) IMPLANT
EVACUATOR SILICONE 100CC (DRAIN) IMPLANT
FORCEPS BIPOLAR SPETZLER 8 1.0 (NEUROSURGERY SUPPLIES) ×2 IMPLANT
GAUZE SPONGE 4X4 12PLY STRL (GAUZE/BANDAGES/DRESSINGS) ×3 IMPLANT
GAUZE SPONGE 4X4 16PLY XRAY LF (GAUZE/BANDAGES/DRESSINGS) IMPLANT
GLOVE BIO SURGEON STRL SZ8 (GLOVE) ×3 IMPLANT
GLOVE BIOGEL PI IND STRL 7.5 (GLOVE) IMPLANT
GLOVE BIOGEL PI IND STRL 8 (GLOVE) ×1 IMPLANT
GLOVE BIOGEL PI IND STRL 8.5 (GLOVE) ×1 IMPLANT
GLOVE BIOGEL PI INDICATOR 7.5 (GLOVE) ×2
GLOVE BIOGEL PI INDICATOR 8 (GLOVE) ×2
GLOVE BIOGEL PI INDICATOR 8.5 (GLOVE) ×2
GLOVE ECLIPSE 7.0 STRL STRAW (GLOVE) ×2 IMPLANT
GLOVE ECLIPSE 8.0 STRL XLNG CF (GLOVE) ×3 IMPLANT
GLOVE EXAM NITRILE LRG STRL (GLOVE) IMPLANT
GLOVE EXAM NITRILE MD LF STRL (GLOVE) IMPLANT
GLOVE EXAM NITRILE XL STR (GLOVE) IMPLANT
GLOVE EXAM NITRILE XS STR PU (GLOVE) IMPLANT
GOWN STRL REUS W/ TWL LRG LVL3 (GOWN DISPOSABLE) IMPLANT
GOWN STRL REUS W/ TWL XL LVL3 (GOWN DISPOSABLE) IMPLANT
GOWN STRL REUS W/TWL 2XL LVL3 (GOWN DISPOSABLE) IMPLANT
GOWN STRL REUS W/TWL LRG LVL3 (GOWN DISPOSABLE)
GOWN STRL REUS W/TWL XL LVL3 (GOWN DISPOSABLE)
HEMOSTAT SURGICEL 2X14 (HEMOSTASIS) ×3 IMPLANT
KIT BASIN OR (CUSTOM PROCEDURE TRAY) ×3 IMPLANT
KIT ROOM TURNOVER OR (KITS) ×3 IMPLANT
MARKER SKIN DUAL TIP RULER LAB (MISCELLANEOUS) ×3 IMPLANT
MARKER SPHERE PSV REFLC THRD 3 (MARKER) ×4 IMPLANT
NDL HYPO 25X1 1.5 SAFETY (NEEDLE) ×1 IMPLANT
NEEDLE HYPO 25X1 1.5 SAFETY (NEEDLE) ×3 IMPLANT
NS IRRIG 1000ML POUR BTL (IV SOLUTION) ×6 IMPLANT
PACK CRANIOTOMY (CUSTOM PROCEDURE TRAY) ×3 IMPLANT
PAD ARMBOARD 7.5X6 YLW CONV (MISCELLANEOUS) ×3 IMPLANT
PAD DRESSING TELFA 3X8 NADH (GAUZE/BANDAGES/DRESSINGS) ×1 IMPLANT
PAD EYE OVAL STERILE LF (GAUZE/BANDAGES/DRESSINGS) IMPLANT
PATTIES SURGICAL .25X.25 (GAUZE/BANDAGES/DRESSINGS) IMPLANT
PATTIES SURGICAL .5 X.5 (GAUZE/BANDAGES/DRESSINGS) IMPLANT
PATTIES SURGICAL .5 X3 (DISPOSABLE) ×2 IMPLANT
PATTIES SURGICAL 1/4 X 3 (GAUZE/BANDAGES/DRESSINGS) IMPLANT
PATTIES SURGICAL 1X1 (DISPOSABLE) IMPLANT
PIN MAYFIELD SKULL DISP (PIN) ×3 IMPLANT
PLATE 1.5/0.5 18.5MM BURR HOLE (Plate) ×8 IMPLANT
RUBBERBAND STERILE (MISCELLANEOUS) IMPLANT
SCREW SELF DRILL HT 1.5/4MM (Screw) ×40 IMPLANT
SPECIMEN JAR SMALL (MISCELLANEOUS) IMPLANT
SPONGE NEURO XRAY DETECT 1X3 (DISPOSABLE) IMPLANT
SPONGE SURGIFOAM ABS GEL 100 (HEMOSTASIS) ×3 IMPLANT
STAPLER SKIN PROX WIDE 3.9 (STAPLE) ×3 IMPLANT
SUT ETHILON 3 0 FSL (SUTURE) IMPLANT
SUT NURALON 4 0 TR CR/8 (SUTURE) ×9 IMPLANT
SUT SILK 2 0 FS (SUTURE) IMPLANT
SUT VIC AB 2-0 CP2 18 (SUTURE) ×6 IMPLANT
SYR CONTROL 10ML LL (SYRINGE) ×3 IMPLANT
TIP SONASTAR STD MISONIX 1.9 (TRAY / TRAY PROCEDURE) IMPLANT
TOWEL OR 17X24 6PK STRL BLUE (TOWEL DISPOSABLE) ×3 IMPLANT
TOWEL OR 17X26 10 PK STRL BLUE (TOWEL DISPOSABLE) ×3 IMPLANT
TRAY FOLEY CATH 14FRSI W/METER (CATHETERS) ×3 IMPLANT
TUBE CONNECTING 12'X1/4 (SUCTIONS) ×1
TUBE CONNECTING 12X1/4 (SUCTIONS) ×2 IMPLANT
UNDERPAD 30X30 INCONTINENT (UNDERPADS AND DIAPERS) ×3 IMPLANT
WATER STERILE IRR 1000ML POUR (IV SOLUTION) ×3 IMPLANT

## 2014-05-19 NOTE — Transfer of Care (Signed)
Immediate Anesthesia Transfer of Care Note  Patient: Julie Schmidt  Procedure(s) Performed: Procedure(s) with comments: CRANIOTOMY TUMOR EXCISION with Curve (Left) - Left frontal craniotomy for meningioma with brain lab  Patient Location: PACU  Anesthesia Type:General  Level of Consciousness: awake, patient cooperative and lethargic  Airway & Oxygen Therapy: Patient Spontanous Breathing and Patient connected to nasal cannula oxygen  Post-op Assessment: Report given to PACU RN, Post -op Vital signs reviewed and stable and Patient moving all extremities X 4  Post vital signs: Reviewed and stable  Complications: No apparent anesthesia complications

## 2014-05-19 NOTE — Progress Notes (Signed)
Pt arrived to pacu with dsg saturated and cont to bleed steadily. Dr.Stern notified and to bedside. Called his nurse Aaron Edelman to add staples and hold pressure.

## 2014-05-19 NOTE — Progress Notes (Signed)
Patient awake, alert, doing well following craniotomy.  MAEW.

## 2014-05-19 NOTE — Anesthesia Preprocedure Evaluation (Addendum)
Anesthesia Evaluation  Patient identified by MRN, date of birth, ID band Patient awake    Reviewed: Allergy & Precautions, H&P , NPO status , Patient's Chart, lab work & pertinent test results  Airway Mallampati: I  TM Distance: >3 FB Neck ROM: Full    Dental   Pulmonary neg pulmonary ROS,  breath sounds clear to auscultation        Cardiovascular negative cardio ROS  Rhythm:Regular Rate:Normal     Neuro/Psych    GI/Hepatic negative GI ROS, Neg liver ROS,   Endo/Other  negative endocrine ROSdiabetes  Renal/GU negative Renal ROS     Musculoskeletal   Abdominal   Peds  Hematology   Anesthesia Other Findings   Reproductive/Obstetrics                           Anesthesia Physical Anesthesia Plan  ASA: III  Anesthesia Plan: General   Post-op Pain Management:    Induction: Intravenous  Airway Management Planned: Oral ETT  Additional Equipment:   Intra-op Plan:   Post-operative Plan: Possible Post-op intubation/ventilation  Informed Consent: I have reviewed the patients History and Physical, chart, labs and discussed the procedure including the risks, benefits and alternatives for the proposed anesthesia with the patient or authorized representative who has indicated his/her understanding and acceptance.     Plan Discussed with: CRNA  Anesthesia Plan Comments:        Anesthesia Quick Evaluation

## 2014-05-19 NOTE — Progress Notes (Signed)
Subjective: Patient reports awake, alert, conversant.  Objective: Vital signs in last 24 hours: Temp:  [98 F (36.7 C)] 98 F (36.7 C) (12/10 0926) Pulse Rate:  [75] 75 (12/10 0926) Resp:  [20] 20 (12/10 0926) BP: (187)/(77) 187/77 mmHg (12/10 1010) SpO2:  [100 %] 100 % (12/10 0926) Weight:  [121 lb (54.885 kg)] 121 lb (54.885 kg) (12/10 0926)  Intake/Output from previous day:   Intake/Output this shift: Total I/O In: 1000 [I.V.:1000] Out: 1000 [Urine:750; Blood:250]  Physical Exam: Full strength, alert, conversant.  MAEW.  Doing well.   Lab Results:  Recent Labs  05/19/14 0949  WBC 5.1  HGB 12.4  HCT 38.4  PLT 329   BMET  Recent Labs  05/19/14 0949  NA 142  K 3.8  CL 103  CO2 25  GLUCOSE 106*  BUN 10  CREATININE 0.74  CALCIUM 9.7    Studies/Results: No results found.  Assessment/Plan: Doing well immediately post craniotomy for meningioma.    LOS: 0 days    Peggyann Shoals, MD 05/19/2014, 3:53 PM

## 2014-05-19 NOTE — Anesthesia Procedure Notes (Addendum)
Procedure Name: Intubation Date/Time: 05/19/2014 1:24 PM Performed by: Ned Grace Pre-anesthesia Checklist: Patient identified, Timeout performed, Emergency Drugs available, Suction available and Patient being monitored Patient Re-evaluated:Patient Re-evaluated prior to inductionOxygen Delivery Method: Circle system utilized Preoxygenation: Pre-oxygenation with 100% oxygen Intubation Type: IV induction Ventilation: Mask ventilation without difficulty Laryngoscope Size: Mac and 3 Grade View: Grade I Tube type: Oral Tube size: 7.0 mm Number of attempts: 1 Airway Equipment and Method: Stylet and LTA kit utilized Secured at: 22 cm Tube secured with: Tape Dental Injury: Teeth and Oropharynx as per pre-operative assessment

## 2014-05-19 NOTE — Progress Notes (Signed)
[place] Progress Note Patient Name: Julie Schmidt DOB: 12/08/1940 MRN: [id number]   Date of Service  05/19/2014  HPI/Events of Note  Pt seen as new eval.  S/p crani for menigioma  eICU Interventions  Pt mildly HTNive post op, extubated.  Has prn bp meds     Intervention Category Evaluation Type: New Patient Evaluation  Asencion Noble 05/19/2014, 6:28 PM

## 2014-05-19 NOTE — Op Note (Signed)
05/19/2014  3:31 PM  PATIENT:  Julie Schmidt  73 y.o. female  PRE-OPERATIVE DIAGNOSIS:  Left frontal meningioma  POST-OPERATIVE DIAGNOSIS:  Left frontal meningioma  PROCEDURE:  Procedure(s) with comments: CRANIOTOMY TUMOR EXCISION with Curve (Left) - Left frontal craniotomy for meningioma with brain lab neuro navigation  SURGEON:  Surgeon(s) and Role:    * Erline Levine, MD - Primary  PHYSICIAN ASSISTANT:   ASSISTANTS: Poteat, RN   ANESTHESIA:   general  EBL:  Total I/O In: 1000 [I.V.:1000] Out: 1000 [Urine:750; Blood:250]  BLOOD ADMINISTERED:none  DRAINS: none   LOCAL MEDICATIONS USED:  LIDOCAINE   SPECIMEN:  Excision  DISPOSITION OF SPECIMEN:  PATHOLOGY  COUNTS:  YES  TOURNIQUET:  * No tourniquets in log *  DICTATION: Patient is 73 year old woman with large left frontal convexity meningioma. She has headaches and brain edema.  It was elected to take her to surgery for craniotomy for left frontal brain tumor.  She had a BrainLab MRI for surgical localization of tumor.  Procedure:  Following smooth intubation, patient was placed in left semi-lateral position with blanket roll.  Head was placed in pins and left frontal scalp was shaved and prepped and draped in usual sterile fashion after BrainLab MRI was localized to map tumor location.  Area of planned incision was infiltrated with lidocaine. A linear pre-auricular incision was made and carried through temporalis fascia and muscle to expose calvarium.  Frontal skull flap was elevated exposing the dura directly overlying the brain mass.  There was significant bony hyperostosis which eroded through dura into the tumor.  There were large dural feeding vessels which were cauterized and cut. Dura was opened and cut with a wide margin around the tumor.  Sequential dissection was performed to remove the meningioma.  Hemostasis was assured with irrigation and bipolar and surgicell.  The dura was replaced with a dural graft. The  bone flap was replaced with plates after the hyperostotic portion was removed with the high speed drill. The fascia and galea were closed with 2-0 vicryl sutures and the skin was re approximated with staples.  A sterile occlusive dressing was placed.  Patient was returned to a supine position and taken out of head pins, then extubated in the operating room, having tolerated surgery well.  Counts were correct at the end of the case.  PLAN OF CARE: Admit to inpatient   PATIENT DISPOSITION:  PACU - hemodynamically stable.   Delay start of Pharmacological VTE agent (>24hrs) due to surgical blood loss or risk of bleeding: yes

## 2014-05-19 NOTE — Interval H&P Note (Signed)
History and Physical Interval Note:  05/19/2014 1:08 PM  Julie Schmidt  has presented today for surgery, with the diagnosis of meningioma  The various methods of treatment have been discussed with the patient and family. After consideration of risks, benefits and other options for treatment, the patient has consented to  Procedure(s) with comments: CRANIOTOMY TUMOR EXCISION with Curve (Left) - Left frontal craniotomy for meningioma with brain lab as a surgical intervention .  The patient's history has been reviewed, patient examined, no change in status, stable for surgery.  I have reviewed the patient's chart and labs.  Questions were answered to the patient's satisfaction.     Michelene Keniston D
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
## 2014-05-19 NOTE — Brief Op Note (Signed)
05/19/2014  3:31 PM  PATIENT:  Julie Schmidt  73 y.o. female  PRE-OPERATIVE DIAGNOSIS:  Left frontal meningioma  POST-OPERATIVE DIAGNOSIS:  Left frontal meningioma  PROCEDURE:  Procedure(s) with comments: CRANIOTOMY TUMOR EXCISION with Curve (Left) - Left frontal craniotomy for meningioma with brain lab neuro navigation  SURGEON:  Surgeon(s) and Role:    * Erline Levine, MD - Primary  PHYSICIAN ASSISTANT:   ASSISTANTS: Poteat, RN   ANESTHESIA:   general  EBL:  Total I/O In: 1000 [I.V.:1000] Out: 1000 [Urine:750; Blood:250]  BLOOD ADMINISTERED:none  DRAINS: none   LOCAL MEDICATIONS USED:  LIDOCAINE   SPECIMEN:  Excision  DISPOSITION OF SPECIMEN:  PATHOLOGY  COUNTS:  YES  TOURNIQUET:  * No tourniquets in log *  DICTATION: Patient is 73 year old woman with large left frontal convexity meningioma. She has headaches and brain edema.  It was elected to take her to surgery for craniotomy for left frontal brain tumor.  She had a BrainLab MRI for surgical localization of tumor.  Procedure:  Following smooth intubation, patient was placed in left semi-lateral position with blanket roll.  Head was placed in pins and left frontal scalp was shaved and prepped and draped in usual sterile fashion after BrainLab MRI was localized to map tumor location.  Area of planned incision was infiltrated with lidocaine. A linear pre-auricular incision was made and carried through temporalis fascia and muscle to expose calvarium.  Frontal skull flap was elevated exposing the dura directly overlying the brain mass.  There was significant bony hyperostosis which eroded through dura into the tumor.  There were large dural feeding vessels which were cauterized and cut. Dura was opened and cut with a wide margin around the tumor.  Sequential dissection was performed to remove the meningioma.  Hemostasis was assured with irrigation and bipolar and surgicell.  The dura was replaced with a dural graft. The  bone flap was replaced with plates after the hyperostotic portion was removed with the high speed drill. The fascia and galea were closed with 2-0 vicryl sutures and the skin was re approximated with staples.  A sterile occlusive dressing was placed.  Patient was returned to a supine position and taken out of head pins, then extubated in the operating room, having tolerated surgery well.  Counts were correct at the end of the case.  PLAN OF CARE: Admit to inpatient   PATIENT DISPOSITION:  PACU - hemodynamically stable.   Delay start of Pharmacological VTE agent (>24hrs) due to surgical blood loss or risk of bleeding: yes

## 2014-05-19 NOTE — Anesthesia Postprocedure Evaluation (Signed)
  Anesthesia Post-op Note  Patient: Julie Schmidt  Procedure(s) Performed: Procedure(s) with comments: CRANIOTOMY TUMOR EXCISION with Curve (Left) - Left frontal craniotomy for meningioma with brain lab  Patient Location: PACU  Anesthesia Type:General  Level of Consciousness: awake  Airway and Oxygen Therapy: Patient Spontanous Breathing  Post-op Pain: mild  Post-op Assessment: Post-op Vital signs reviewed  Post-op Vital Signs: Reviewed  Last Vitals:  Filed Vitals:   05/19/14 1010  BP: 187/77  Pulse:   Temp:   Resp:     Complications: No apparent anesthesia complications

## 2014-05-20 ENCOUNTER — Encounter (HOSPITAL_COMMUNITY): Payer: Self-pay | Admitting: Neurosurgery

## 2014-05-20 ENCOUNTER — Inpatient Hospital Stay (HOSPITAL_COMMUNITY): Payer: Medicare Other

## 2014-05-20 MED ORDER — ENSURE COMPLETE PO LIQD
237.0000 mL | Freq: Two times a day (BID) | ORAL | Status: DC
  Administered 2014-05-20 – 2014-05-22 (×2): 237 mL via ORAL

## 2014-05-20 MED ORDER — GADOBENATE DIMEGLUMINE 529 MG/ML IV SOLN
10.0000 mL | Freq: Once | INTRAVENOUS | Status: AC | PRN
  Administered 2014-05-20: 10 mL via INTRAVENOUS

## 2014-05-20 NOTE — Progress Notes (Signed)
UR completed.  Brennley Curtice, RN BSN [facility] CCM Trauma/Neuro ICU Case Manager [phone]  

## 2014-05-20 NOTE — Progress Notes (Addendum)
INITIAL NUTRITION ASSESSMENT  DOCUMENTATION CODES Per approved criteria  -Not Applicable   INTERVENTION: Ensure Complete po BID, each supplement provides 350 kcal and 13 grams of protein  NUTRITION DIAGNOSIS: Inadequate oral intake related to decreased appetite from DM and stress as evidenced by per pt report.   Goal: Pt to meet >/= 90% of their estimated nutrition needs   Monitor:  PO intake, supplement acceptance, weight trends  Reason for Assessment: Pt identified as at nutrition risk on the Malnutrition Screen Tool  73 y.o. female  Admitting Dx: Meningioma   ASSESSMENT: Pt is now s/p crani for meningioma.  History per pt and daughter. Pt lives with daughter, pt does the cooking but has been eating less over the last year. Per pt and daughter DM not new but affecting her appetite and stress as well. Per pt her usual weight is 130 lb which would indicate a 8% weight loss. Pt is unable to confirm when she started to lose the weight. She is also not specific about what and how much she eats. Pt is willing to try ensure.   Nutrition Focused Physical Exam:  Subcutaneous Fat:  Orbital Region: WDL Upper Arm Region: Sports administrator and Lumbar Region: WDL  Muscle:  Temple Region: WDL Clavicle Bone Region: mild/moderate depletion  Clavicle and Acromion Bone Region: mild/moderate depletion  Scapular Bone Region: WDL  Dorsal Hand: WDL  Patellar Region: WDL  Anterior Thigh Region: WDL Posterior Calf Region: WDL  Edema: not present    Height: Ht Readings from Last 1 Encounters:  05/19/14 5\' 5"  (1.651 m)    Weight: Wt Readings from Last 1 Encounters:  05/19/14 120 lb 9.5 oz (54.7 kg)    Ideal Body Weight: 56.8 kg   % Ideal Body Weight: 96%  Wt Readings from Last 10 Encounters:  05/19/14 120 lb 9.5 oz (54.7 kg)  05/12/14 121 lb (54.885 kg)  03/24/14 130 lb (58.968 kg)  12/17/11 118 lb 6.4 oz (53.706 kg)  10/25/11 118 lb (53.524 kg)    Usual Body Weight: 130 lb    % Usual Body Weight: 92%  BMI:  Body mass index is 20.07 kg/(m^2).  Estimated Nutritional Needs: Kcal: 1400-1600 Protein: 65-75 grams Fluid: > 1.5 L/day  Skin: incision  Diet Order: Diet regular  EDUCATION NEEDS: -No education needs identified at this time   Intake/Output Summary (Last 24 hours) at 05/20/14 1401 Last data filed at 05/20/14 1000  Gross per 24 hour  Intake 1471.25 ml  Output   4275 ml  Net -2803.75 ml    Last BM: PTA   Labs:   Recent Labs Lab 05/19/14 0949  NA 142  K 3.8  CL 103  CO2 25  BUN 10  CREATININE 0.74  CALCIUM 9.7  GLUCOSE 106*    CBG (last 3)   Recent Labs  05/19/14 1548  GLUCAP 124*    Scheduled Meds: . aspirin EC  81 mg Oral Daily  . dexamethasone  6 mg Intravenous 4 times per day   Followed by  . dexamethasone  4 mg Intravenous 4 times per day   Followed by  . [START ON 05/21/2014] dexamethasone  4 mg Intravenous 3 times per day  . docusate sodium  100 mg Oral BID  . levETIRAcetam  500 mg Intravenous Q12H  . mupirocin ointment   Nasal Once  . pantoprazole (PROTONIX) IV  40 mg Intravenous QHS  . senna  1 tablet Oral BID    Continuous Infusions: . 0.9 %  NaCl with KCl 20 mEq / L 75 mL/hr at 05/19/14 2000    Past Medical History  Diagnosis Date  . Diabetes mellitus   . Headache   . Arthritis     knees, back    Past Surgical History  Procedure Laterality Date  . Cesarean section    . Appendectomy    . Abdominal hysterectomy      Maylon Peppers RD, [place], [place] Pager [phone] After Hours Pager

## 2014-05-20 NOTE — Progress Notes (Signed)
Subjective: Patient reports "Little bit" (when asked about headache)  Objective: Vital signs in last 24 hours: Temp:  [97 F (36.1 C)-100 F (37.8 C)] 100 F (37.8 C) (12/11 0400) Pulse Rate:  [51-95] 80 (12/11 0600) Resp:  [11-32] 32 (12/11 0600) BP: (126-196)/(58-101) 126/58 mmHg (12/11 0600) SpO2:  [94 %-100 %] 98 % (12/11 0600) Arterial Line BP: (171-219)/(58-89) 175/58 mmHg (12/11 0600) Weight:  [54.7 kg (120 lb 9.5 oz)-54.885 kg (121 lb)] 54.7 kg (120 lb 9.5 oz) (12/10 1800)  Intake/Output from previous day: 12/10 0701 - 12/11 0700 In: 2171.3 [I.V.:1871.3; IV Piggyback:300] Out: 4575 [Urine:4325; Blood:250] Intake/Output this shift:    Opens eyes to voice, smiles. Reports only mild headache, answering simple questions with broken English.  PEARL. No drift. MAEW. Drsg intact with some dried blood.   Lab Results:  Recent Labs  05/19/14 0949  WBC 5.1  HGB 12.4  HCT 38.4  PLT 329   BMET  Recent Labs  05/19/14 0949  NA 142  K 3.8  CL 103  CO2 25  GLUCOSE 106*  BUN 10  CREATININE 0.74  CALCIUM 9.7    Studies/Results: No results found.  Assessment/Plan: Improving   LOS: 1 day  Per DrStern, ok to d/c Foley & arterial line; mobilize as tolerated.   Verdis Prime 05/20/2014, 7:34 AM

## 2014-05-21 ENCOUNTER — Encounter (HOSPITAL_COMMUNITY): Payer: Self-pay | Admitting: *Deleted

## 2014-05-21 LAB — GLUCOSE, CAPILLARY
GLUCOSE-CAPILLARY: 141 mg/dL — AB (ref 70–99)
Glucose-Capillary: 168 mg/dL — ABNORMAL HIGH (ref 70–99)

## 2014-05-21 MED ORDER — INSULIN ASPART 100 UNIT/ML ~~LOC~~ SOLN: 0.0000 [IU] | Freq: Three times a day (TID) | SUBCUTANEOUS | Status: DC

## 2014-05-21 MED ORDER — LEVETIRACETAM 500 MG PO TABS
500.0000 mg | ORAL_TABLET | Freq: Two times a day (BID) | ORAL | Status: DC
  Administered 2014-05-21 – 2014-05-22 (×3): 500 mg via ORAL
  Filled 2014-05-21 (×5): qty 1

## 2014-05-21 NOTE — Progress Notes (Signed)
Patient ID: Julie Schmidt, female   DOB: [birth year], 73 y.o.   MRN: [id number] Patient is awake and alert Dressing remains intact She has been ambulatory with assistance Plan transfer to the floor

## 2014-05-22 LAB — GLUCOSE, CAPILLARY
GLUCOSE-CAPILLARY: 113 mg/dL — AB (ref 70–99)
GLUCOSE-CAPILLARY: 106 mg/dL — AB (ref 70–99)
Glucose-Capillary: 102 mg/dL — ABNORMAL HIGH (ref 70–99)

## 2014-05-22 MED ORDER — HYDROCODONE-ACETAMINOPHEN 5-325 MG PO TABS: 2.0000 | ORAL_TABLET | Freq: Four times a day (QID) | ORAL | Status: DC | PRN

## 2014-05-22 MED ORDER — DEXAMETHASONE 1 MG PO TABS: 1.0000 mg | ORAL_TABLET | Freq: Two times a day (BID) | ORAL | Status: DC

## 2014-05-22 MED ORDER — LEVETIRACETAM 500 MG PO TABS: 500.0000 mg | ORAL_TABLET | Freq: Two times a day (BID) | ORAL | Status: DC

## 2014-05-22 MED ORDER — PANTOPRAZOLE SODIUM 40 MG PO TBEC: 40.0000 mg | DELAYED_RELEASE_TABLET | Freq: Every day | ORAL | Status: DC

## 2014-05-22 NOTE — Discharge Summary (Signed)
Physician Discharge Summary  Patient ID: Julie Schmidt MRN: [id number] DOB/AGE: August 08, 1940 73 y.o.  Admit date: 05/19/2014 Discharge date: 05/22/2014  Admission Diagnoses: Frontal meningioma  Discharge Diagnoses: Left frontal meningioma Active Problems:   Meningioma   Discharged Condition: Good  Hospital Course: Patient tolerated surgery well she is discharged home  Consults: None  Significant Diagnostic Studies: None  Treatments: surgery: Left frontal craniotomy gross total removal of tumor  Discharge Exam: Blood pressure 144/60, pulse 63, temperature 98.2 F (36.8 C), temperature source Oral, resp. rate 20, height 5\' 5"  (1.651 m), weight 54.7 kg (120 lb 9.5 oz), SpO2 95 %. Resting is dry and intact neurologic function is normal patient's cranial nerve examination is normal station and gait are intact  Disposition: 01-Home or Self Care  Discharge Instructions    Call MD for:  persistant nausea and vomiting    Complete by:  As directed      Call MD for:  redness, tenderness, or signs of infection (pain, swelling, redness, odor or green/yellow discharge around incision site)    Complete by:  As directed      Call MD for:  severe uncontrolled pain    Complete by:  As directed      Call MD for:  temperature >100.4    Complete by:  As directed      Diet - low sodium heart healthy    Complete by:  As directed      Increase activity slowly    Complete by:  As directed             Medication List    TAKE these medications        aspirin 81 MG tablet  Take 81 mg by mouth daily.     aspirin-sod bicarb-citric acid 325 MG Tbef tablet  Commonly known as:  ALKA-SELTZER  Take 325 mg by mouth every 6 (six) hours as needed.     dexamethasone 1 MG tablet  Commonly known as:  DECADRON  Take 1 tablet (1 mg total) by mouth 2 (two) times daily with a meal.     HYDROcodone-acetaminophen 5-325 MG per tablet  Commonly known as:  NORCO/VICODIN  Take 2 tablets by mouth every 6  (six) hours as needed for moderate pain or severe pain.     HYDROcodone-acetaminophen 5-325 MG per tablet  Commonly known as:  NORCO/VICODIN  Take 2 tablets by mouth every 6 (six) hours as needed for moderate pain or severe pain.     levETIRAcetam 500 MG tablet  Commonly known as:  KEPPRA  Take 1 tablet (500 mg total) by mouth 2 (two) times daily.     naproxen sodium 220 MG tablet  Commonly known as:  ANAPROX  Take 220 mg by mouth daily as needed (pain).     ondansetron 4 MG tablet  Commonly known as:  ZOFRAN  Take 1 tablet (4 mg total) by mouth every 6 (six) hours.         SignedEarleen Newport 05/22/2014, 12:46 PM

## 2014-05-22 NOTE — Progress Notes (Signed)
Patient alert and oriented, voiding adequate amount of urine, maesx4, with no c/o pain. Patient was discharged home with family. Script and discharged instructions given to patient and family with the stated understanding of instructions given. RN also informed patient of two follow up appointment coming soon. Tomma Rakers RN

## 2014-05-22 NOTE — Plan of Care (Signed)
Problem: Consults Goal: Diagnosis - Craniotomy Outcome: Completed/Met Date Met:  05/22/14 maningioma

## 2014-05-22 NOTE — Discharge Instructions (Signed)
Meningioma Meningioma is a tumor that occurs in the thin tissue that covers the brain and spinal cord (meninges). Meningiomas are usually benign, which means they are not cancerous and do not spread to other areas. In rare cases, a meningioma may become cancerous (malignant). Older women have a higher risk of having meningiomas. However, men have a higher risk of having a meningioma that is malignant. RISK FACTORS People who have had radiation exposure in the past may have an increased risk of developing this type of tumor. People who have neurofibromatosis 2 may also have an increased risk of meningioma. Older women have a higher risk of meningiomas than men or children. However, men have a higher risk of meningiomas that are malignant. SIGNS AND SYMPTOMS Symptoms of meningioma usually begin very slowly. The symptoms may depend on the size and location of the tumor. Possible symptoms include:   Headaches.  Nausea and vomiting.  Vision changes.  Hearing changes.   Loss of the sense of smell.  Seizures.   Weakness or numbness on one side of the body or in an arm or leg.   Mood changes.   Problems with memory or thinking.  DIAGNOSIS  Brain tumors can usually be seen on brain imaging, such as CT scan or MRI. A sample of the tumor will need to be studied in a laboratory (biopsy) to confirm the diagnosis of meningioma. Information about the tumor cells also helps guide treatment. TREATMENT  Because meningioma is so slow growing, treatment is often delayed until symptoms affect daily activities. Regular monitoring is performed to track the tumor's growth.  There are several ways that meningioma is treated:  Surgery to remove as much of the tumor as possible.  High-energy rays (radiation therapy) to help shrink or kill the tumor.  Chemotherapy to shrink or kill the tumor. Because normal cells may also be killed, chemotherapy has many side effects.  Targeted therapy, using  substances that injure or kill cancer cells without affecting normal cells.  Steroid medicine to decrease brain swelling and improve symptoms. HOME CARE INSTRUCTIONS  Take all medicines as directed by your health care provider.  Go to all follow-up appointments. SEEK MEDICAL CARE IF:  Any symptoms come back.  You have diarrhea, throw up (vomit), or have abdominal pain.  You cannot eat or drink as much as you need.  You are more weak or tired than usual.   You are losing weight without trying. SEEK IMMEDIATE MEDICAL CARE IF:  Your diarrhea, vomiting, or abdominal pain does not go away.  You have new symptoms, such as vision problems or difficulty walking.   You have a seizure.   You have bleeding that does not stop.   You have trouble breathing.   You have a fever.  Document Released: 06/01/2013 Document Reviewed: 06/01/2013 [facility] Patient Information 2015 [place], [place]. This information is not intended to replace advice given to you by your health care provider. Make sure you discuss any questions you have with your health care provider. Meningioma Meningioma is a tumor that occurs in the thin tissue that covers the brain and spinal cord (meninges). Meningiomas are usually benign, which means they are not cancerous and do not spread to other areas. In rare cases, a meningioma may become cancerous (malignant). Older women have a higher risk of having meningiomas. However, men have a higher risk of having a meningioma that is malignant. RISK FACTORS People who have had radiation exposure in the past may have an increased risk  of developing this type of tumor. People who have neurofibromatosis 2 may also have an increased risk of meningioma. Older women have a higher risk of meningiomas than men or children. However, men have a higher risk of meningiomas that are malignant. SIGNS AND SYMPTOMS Symptoms of meningioma usually begin very slowly. The symptoms may depend  on the size and location of the tumor. Possible symptoms include:   Headaches.  Nausea and vomiting.  Vision changes.  Hearing changes.   Loss of the sense of smell.  Seizures.   Weakness or numbness on one side of the body or in an arm or leg.   Mood changes.   Problems with memory or thinking.  DIAGNOSIS  Brain tumors can usually be seen on brain imaging, such as CT scan or MRI. A sample of the tumor will need to be studied in a laboratory (biopsy) to confirm the diagnosis of meningioma. Information about the tumor cells also helps guide treatment. TREATMENT  Because meningioma is so slow growing, treatment is often delayed until symptoms affect daily activities. Regular monitoring is performed to track the tumor's growth.  There are several ways that meningioma is treated:  Surgery to remove as much of the tumor as possible.  High-energy rays (radiation therapy) to help shrink or kill the tumor.  Chemotherapy to shrink or kill the tumor. Because normal cells may also be killed, chemotherapy has many side effects.  Targeted therapy, using substances that injure or kill cancer cells without affecting normal cells.  Steroid medicine to decrease brain swelling and improve symptoms. HOME CARE INSTRUCTIONS  Take all medicines as directed by your health care provider.  Go to all follow-up appointments. SEEK MEDICAL CARE IF:  Any symptoms come back.  You have diarrhea, throw up (vomit), or have abdominal pain.  You cannot eat or drink as much as you need.  You are more weak or tired than usual.   You are losing weight without trying. SEEK IMMEDIATE MEDICAL CARE IF:  Your diarrhea, vomiting, or abdominal pain does not go away.  You have new symptoms, such as vision problems or difficulty walking.   You have a seizure.   You have bleeding that does not stop.   You have trouble breathing.   You have a fever.  Document Released: 06/01/2013 Document  Reviewed: 06/01/2013 [facility] Patient Information 2015 [place], [place]. This information is not intended to replace advice given to you by your health care provider. Make sure you discuss any questions you have with your health care provider.

## 2014-05-31 ENCOUNTER — Encounter: Payer: Self-pay | Admitting: Internal Medicine

## 2014-05-31 ENCOUNTER — Ambulatory Visit: Payer: Medicare Other | Admitting: Internal Medicine

## 2014-05-31 ENCOUNTER — Ambulatory Visit (INDEPENDENT_AMBULATORY_CARE_PROVIDER_SITE_OTHER): Payer: Medicare Other | Admitting: Internal Medicine

## 2014-05-31 VITALS — BP 110/60 | HR 58 | Temp 98.5°F | Resp 18 | Ht 61.5 in | Wt 118.0 lb

## 2014-05-31 DIAGNOSIS — E119 Type 2 diabetes mellitus without complications: Secondary | ICD-10-CM | POA: Insufficient documentation

## 2014-05-31 DIAGNOSIS — E1149 Type 2 diabetes mellitus with other diabetic neurological complication: Secondary | ICD-10-CM | POA: Insufficient documentation

## 2014-05-31 LAB — HEMOGLOBIN A1C: Hgb A1c MFr Bld: 6.6 % — ABNORMAL HIGH (ref 4.6–6.5)

## 2014-05-31 NOTE — Patient Instructions (Addendum)
Return in one month for follow-up Neurosurgery follow-up as scheduled    It is important that you exercise regularly, at least 20 minutes 3 to 4 times per week.  If you develop chest pain or shortness of breath seek  medical attention.
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
## 2014-05-31 NOTE — Progress Notes (Signed)
Pre visit review using our clinic review tool, if applicable. No additional management support is needed unless otherwise documented below in the visit note. 

## 2014-06-28 ENCOUNTER — Ambulatory Visit (INDEPENDENT_AMBULATORY_CARE_PROVIDER_SITE_OTHER): Payer: Medicare Other | Admitting: Internal Medicine

## 2014-06-28 ENCOUNTER — Encounter: Payer: Self-pay | Admitting: Internal Medicine

## 2014-06-28 VITALS — BP 150/74 | HR 66 | Temp 98.0°F | Resp 18 | Ht 61.5 in | Wt 124.0 lb

## 2014-06-28 DIAGNOSIS — E119 Type 2 diabetes mellitus without complications: Secondary | ICD-10-CM | POA: Diagnosis not present

## 2014-06-28 DIAGNOSIS — M549 Dorsalgia, unspecified: Secondary | ICD-10-CM

## 2014-06-28 DIAGNOSIS — G8929 Other chronic pain: Secondary | ICD-10-CM

## 2014-06-28 MED ORDER — DEXAMETHASONE 1 MG PO TABS: 1.0000 mg | ORAL_TABLET | Freq: Two times a day (BID) | ORAL | Status: DC

## 2014-06-28 NOTE — Progress Notes (Signed)
   Subjective:    Patient ID: Julie Schmidt, female    DOB: Nov 07, 1940, 74 y.o.   MRN: [id number]  HPI  Wt Readings from Last 3 Encounters:  06/28/14 124 lb (56.246 kg)  05/31/14 118 lb (53.524 kg)  05/19/14 120 lb 9.5 oz (54.7 kg)    Review of Systems     Objective:   Physical Exam        Assessment & Plan:

## 2014-06-28 NOTE — Patient Instructions (Signed)
Limit your sodium (Salt) intake    It is important that you exercise regularly, at least 20 minutes 3 to 4 times per week.  If you develop chest pain or shortness of breath seek  medical attention.  Return in 6 months for follow-up  

## 2014-06-28 NOTE — Progress Notes (Signed)
Subjective:    Patient ID: Julie Schmidt, female    DOB: [birth year], 74 y.o.   MRN: [id number]  HPI 74 year old patient who is status post craniotomy last month for meningioma. She has done quite well postoperatively.  Her weight is up several pounds and she has regained some of her strength.  She has self discontinued all medications except for daily aspirin. She is scheduled for neurosurgery follow-up next month.  Her only complaint is some mild low back pain with activity.  She does have a prior history of intermittent chronic low back pain.  She has a history of diet-controlled diabetes.  Hemoglobin A1c at goal one month ago.  She is now off low-dose Decadron  Past Medical History  Diagnosis Date  . Diabetes mellitus   . Headache   . Arthritis     knees, back    History   Social History  . Marital Status: Widowed    Spouse Name: N/A    Number of Children: N/A  . Years of Education: N/A   Occupational History  . Not on file.   Social History Main Topics  . Smoking status: Never Smoker   . Smokeless tobacco: Never Used  . Alcohol Use: No  . Drug Use: No  . Sexual Activity: Not on file   Other Topics Concern  . Not on file   Social History Narrative    Past Surgical History  Procedure Laterality Date  . Cesarean section    . Appendectomy    . Abdominal hysterectomy    . Craniotomy Left 05/19/2014    Procedure: CRANIOTOMY TUMOR EXCISION with Curve;  Surgeon: Erline Levine, MD;  Location: [facility];  Service: Neurosurgery;  Laterality: Left;  Left frontal craniotomy for meningioma with brain lab  . Brain surgery  05/19/2014    Craniotomy for meningioma    Family History  Problem Relation Age of Onset  . Cancer Father     No Known Allergies  Current Outpatient Prescriptions on File Prior to Visit  Medication Sig Dispense Refill  . aspirin 81 MG tablet Take 81 mg by mouth daily.    Marland Kitchen aspirin-sod bicarb-citric acid (ALKA-SELTZER) 325 MG TBEF tablet Take  325 mg by mouth every 6 (six) hours as needed.    Marland Kitchen dexamethasone (DECADRON) 1 MG tablet Take 1 tablet (1 mg total) by mouth 2 (two) times daily with a meal. 60 tablet 3  . levETIRAcetam (KEPPRA) 500 MG tablet Take 1 tablet (500 mg total) by mouth 2 (two) times daily. 60 tablet 3  . naproxen sodium (ANAPROX) 220 MG tablet Take 220 mg by mouth daily as needed (pain).    . ondansetron (ZOFRAN) 4 MG tablet Take 1 tablet (4 mg total) by mouth every 6 (six) hours. 12 tablet 0  . HYDROcodone-acetaminophen (NORCO/VICODIN) 5-325 MG per tablet Take 2 tablets by mouth every 6 (six) hours as needed for moderate pain or severe pain. (Patient not taking: Reported on 06/28/2014) 30 tablet 0   No current facility-administered medications on file prior to visit.    BP 150/74 mmHg  Pulse 66  Temp(Src) 98 F (36.7 C) (Oral)  Resp 18  Ht 5' 1.5" (1.562 m)  Wt 124 lb (56.246 kg)  BMI 23.05 kg/m2  SpO2 96%      Review of Systems  Constitutional: Positive for fatigue.  HENT: Negative for congestion, dental problem, hearing loss, rhinorrhea, sinus pressure, sore throat and tinnitus.   Eyes: Negative for pain, discharge and  visual disturbance.  Respiratory: Negative for cough and shortness of breath.   Cardiovascular: Negative for chest pain, palpitations and leg swelling.  Gastrointestinal: Negative for nausea, vomiting, abdominal pain, diarrhea, constipation, blood in stool and abdominal distention.  Genitourinary: Negative for dysuria, urgency, frequency, hematuria, flank pain, vaginal bleeding, vaginal discharge, difficulty urinating, vaginal pain and pelvic pain.  Musculoskeletal: Positive for back pain. Negative for joint swelling, arthralgias and gait problem.  Skin: Negative for rash.  Neurological: Positive for weakness. Negative for dizziness, syncope, speech difficulty, numbness and headaches.  Hematological: Negative for adenopathy.  Psychiatric/Behavioral: Negative for behavioral problems,  dysphoric mood and agitation. The patient is not nervous/anxious.        Objective:   Physical Exam  Constitutional: She is oriented to person, place, and time. She appears well-developed and well-nourished.  Blood pressure 140/70  HENT:  Head: Normocephalic.  Right Ear: External ear normal.  Left Ear: External ear normal.  Mouth/Throat: Oropharynx is clear and moist.  Eyes: Conjunctivae and EOM are normal. Pupils are equal, round, and reactive to light.  Neck: Normal range of motion. Neck supple. No thyromegaly present.  Cardiovascular: Normal rate, regular rhythm, normal heart sounds and intact distal pulses.   Pulmonary/Chest: Effort normal and breath sounds normal.  Abdominal: Soft. Bowel sounds are normal. She exhibits no mass. There is no tenderness.  Musculoskeletal: Normal range of motion.  Lymphadenopathy:    She has no cervical adenopathy.  Neurological: She is alert and oriented to person, place, and time.  Skin: Skin is warm and dry. No rash noted.  Psychiatric: She has a normal mood and affect. Her behavior is normal.          Assessment & Plan:   Status post craniotomy Diet-controlled diabetes  It was recommended to resume Keppra.  Neurosurgery follow-up next month as scheduled Patient will slowly increase her exercise regimen Recheck 6 months

## 2014-06-28 NOTE — Progress Notes (Signed)
Pre visit review using our clinic review tool, if applicable. No additional management support is needed unless otherwise documented below in the visit note. 

## 2014-08-31 DIAGNOSIS — D329 Benign neoplasm of meninges, unspecified: Secondary | ICD-10-CM | POA: Diagnosis not present

## 2014-09-16 ENCOUNTER — Encounter: Payer: Self-pay | Admitting: Family

## 2014-09-16 ENCOUNTER — Ambulatory Visit (INDEPENDENT_AMBULATORY_CARE_PROVIDER_SITE_OTHER): Payer: Medicare Other | Admitting: Family

## 2014-09-16 VITALS — BP 170/80 | HR 81 | Temp 98.3°F | Ht 61.5 in | Wt 138.3 lb

## 2014-09-16 DIAGNOSIS — K2100 Gastro-esophageal reflux disease with esophagitis, without bleeding: Secondary | ICD-10-CM

## 2014-09-16 DIAGNOSIS — R1013 Epigastric pain: Secondary | ICD-10-CM | POA: Diagnosis not present

## 2014-09-16 LAB — POCT URINALYSIS DIPSTICK
BILIRUBIN UA: NEGATIVE
Blood, UA: NEGATIVE
Glucose, UA: NEGATIVE
Ketones, UA: NEGATIVE
Nitrite, UA: NEGATIVE
PROTEIN UA: NEGATIVE
SPEC GRAV UA: 1.02
Urobilinogen, UA: 0.2
pH, UA: 6

## 2014-09-16 LAB — CBC WITH DIFFERENTIAL/PLATELET
Basophils Absolute: 0 10*3/uL (ref 0.0–0.1)
Basophils Relative: 0.5 % (ref 0.0–3.0)
Eosinophils Absolute: 0.1 10*3/uL (ref 0.0–0.7)
Eosinophils Relative: 1.8 % (ref 0.0–5.0)
HCT: 37.3 % (ref 36.0–46.0)
Hemoglobin: 12.4 g/dL (ref 12.0–15.0)
Lymphocytes Relative: 22 % (ref 12.0–46.0)
Lymphs Abs: 1.2 10*3/uL (ref 0.7–4.0)
MCHC: 33.3 g/dL (ref 30.0–36.0)
MCV: 88.7 fl (ref 78.0–100.0)
MONOS PCT: 7.6 % (ref 3.0–12.0)
Monocytes Absolute: 0.4 10*3/uL (ref 0.1–1.0)
Neutro Abs: 3.7 10*3/uL (ref 1.4–7.7)
Neutrophils Relative %: 68.1 % (ref 43.0–77.0)
PLATELETS: 398 10*3/uL (ref 150.0–400.0)
RBC: 4.21 Mil/uL (ref 3.87–5.11)
RDW: 14 % (ref 11.5–15.5)
WBC: 5.4 10*3/uL (ref 4.0–10.5)

## 2014-09-16 LAB — COMPREHENSIVE METABOLIC PANEL
ALBUMIN: 4.3 g/dL (ref 3.5–5.2)
ALT: 24 U/L (ref 0–35)
AST: 28 U/L (ref 0–37)
Alkaline Phosphatase: 109 U/L (ref 39–117)
BILIRUBIN TOTAL: 0.5 mg/dL (ref 0.2–1.2)
BUN: 14 mg/dL (ref 6–23)
CO2: 28 meq/L (ref 19–32)
Calcium: 9.7 mg/dL (ref 8.4–10.5)
Chloride: 105 mEq/L (ref 96–112)
Creatinine, Ser: 0.74 mg/dL (ref 0.40–1.20)
GFR: 99.34 mL/min (ref >60.00)
GLUCOSE: 95 mg/dL (ref 70–99)
POTASSIUM: 3.6 meq/L (ref 3.5–5.1)
SODIUM: 141 meq/L (ref 135–145)
Total Protein: 7.6 g/dL (ref 6.0–8.3)

## 2014-09-16 LAB — AMYLASE: Amylase: 57 U/L (ref 27–131)

## 2014-09-16 LAB — LIPASE: Lipase: 17 U/L (ref 11.0–59.0)

## 2014-09-16 LAB — H. PYLORI ANTIBODY, IGG: H PYLORI IGG: POSITIVE — AB

## 2014-09-16 MED ORDER — OMEPRAZOLE 40 MG PO CPDR: 40.0000 mg | DELAYED_RELEASE_CAPSULE | Freq: Every day | ORAL | Status: DC

## 2014-09-16 NOTE — Patient Instructions (Signed)

## 2014-09-16 NOTE — Progress Notes (Signed)
Subjective:    Patient ID: Julie Schmidt, female    DOB: [birth year], 74 y.o.   MRN: [id number]  HPI Acute Visit  Patient 74 year old, female of African descent, seen today for epigastric abdominal pain radiating to her back times 4 days.  Past medical history of chronic back pain, indigestion, surgical removal of meningioma,and diabetes type 2. She describes the pain as the sensation of "needles" sticking in her abdomen and radiating to her back.  She rates her pain as 8/10. Denies nausea, vomiting, or constipation. Reports last bowel movement on yesterday and it was "normal". No evidence of melena.   Review of Systems  Constitutional: Negative.   HENT: Negative.   Eyes: Negative.   Respiratory: Negative.   Cardiovascular: Negative.   Gastrointestinal: Positive for abdominal pain. Negative for nausea, vomiting, diarrhea, blood in stool, abdominal distention and rectal pain.  Endocrine: Negative.   Genitourinary: Negative.   Musculoskeletal: Negative.   Skin: Negative.   Allergic/Immunologic: Negative.   Neurological: Negative.   Hematological: Negative.   Psychiatric/Behavioral: Negative.        Past Medical History  Diagnosis Date  . Diabetes mellitus   . Headache   . Arthritis     knees, back    History   Social History  . Marital Status: Widowed    Spouse Name: N/A  . Number of Children: N/A  . Years of Education: N/A   Occupational History  . Not on file.   Social History Main Topics  . Smoking status: Never Smoker   . Smokeless tobacco: Never Used  . Alcohol Use: No  . Drug Use: No  . Sexual Activity: Not on file   Other Topics Concern  . Not on file   Social History Narrative    Past Surgical History  Procedure Laterality Date  . Cesarean section    . Appendectomy    . Abdominal hysterectomy    . Craniotomy Left 05/19/2014    Procedure: CRANIOTOMY TUMOR EXCISION with Curve;  Surgeon: Erline Levine, MD;  Location: [facility];  Service:  Neurosurgery;  Laterality: Left;  Left frontal craniotomy for meningioma with brain lab  . Brain surgery  05/19/2014    Craniotomy for meningioma    Family History  Problem Relation Age of Onset  . Cancer Father     No Known Allergies  Current Outpatient Prescriptions on File Prior to Visit  Medication Sig Dispense Refill  . aspirin 81 MG tablet Take 81 mg by mouth daily.    Marland Kitchen dexamethasone (DECADRON) 1 MG tablet Take 1 tablet (1 mg total) by mouth 2 (two) times daily with a meal. (Patient not taking: Reported on 09/16/2014) 60 tablet 3  . levETIRAcetam (KEPPRA) 500 MG tablet Take 1 tablet (500 mg total) by mouth 2 (two) times daily. (Patient not taking: Reported on 09/16/2014) 60 tablet 3  . naproxen sodium (ANAPROX) 220 MG tablet Take 220 mg by mouth daily as needed (pain).     No current facility-administered medications on file prior to visit.    BP 170/80 mmHg  Pulse 81  Temp(Src) 98.3 F (36.8 C) (Oral)  Ht 5' 1.5" (1.562 m)  Wt 138 lb 4.8 oz (62.732 kg)  BMI 25.71 kg/m2chart Objective:   Physical Exam  Constitutional: She is oriented to person, place, and time. She appears well-developed and well-nourished. No distress.  HENT:  Head: Normocephalic and atraumatic.  Right Ear: External ear normal.  Left Ear: External ear normal.  Nose:  Nose normal.  Mouth/Throat: Oropharynx is clear and moist.  Eyes: Conjunctivae and EOM are normal. Pupils are equal, round, and reactive to light.  Neck: Normal range of motion. Neck supple.  Cardiovascular: Normal rate, regular rhythm, normal heart sounds and intact distal pulses.   Pulmonary/Chest: Effort normal and breath sounds normal.  Abdominal: Soft. Bowel sounds are normal. She exhibits no distension and no mass. There is tenderness. There is guarding. There is no rebound.  Musculoskeletal: Normal range of motion.  Neurological: She is alert and oriented to person, place, and time. She has normal reflexes.  Skin: Skin is warm and  dry. She is not diaphoretic.  Psychiatric: She has a normal mood and affect. Her behavior is normal. Judgment and thought content normal.  Nursing note and vitals reviewed.         Assessment & Plan:  Jezabella was seen today for abdominal pain.  Diagnoses and all orders for this visit:  Abdominal pain, epigastric Orders: -     Lipase -     CBC with Differential -     CMP -     H.pylori screen, POC -     POC Urinalysis Dipstick -     Amylase -     Ambulatory referral to Gastroenterology -     H. pylori antibody, IgG  Gastroesophageal reflux disease with esophagitis  Other orders -     omeprazole (PRILOSEC) 40 MG capsule; Take 1 capsule (40 mg total) by mouth daily.   Patient presents with epigastric abdominal pain that radiates medially bilaterally to her back.  Patient still has her gallbladder and has no known history of gallstones.    Plan:  1. Abdominal Pain:  Labs ordered to rule out chololithiasis, pancreatitis, H. pylori, or hepatic dysfunction as the underlying cause for abdominal pain.  2. GERD- Omeprazole 40 mg take once daily.  Will follow-up with labs results.

## 2014-09-16 NOTE — Progress Notes (Signed)
Pre visit review using our clinic review tool, if applicable. No additional management support is needed unless otherwise documented below in the visit note. 

## 2014-09-17 ENCOUNTER — Emergency Department (HOSPITAL_COMMUNITY)
Admission: EM | Admit: 2014-09-17 | Discharge: 2014-09-17 | Disposition: A | Discharge status: Home / Self Care | Payer: Medicare Other | Attending: Emergency Medicine | Admitting: Emergency Medicine

## 2014-09-17 ENCOUNTER — Encounter (HOSPITAL_COMMUNITY): Payer: Self-pay | Admitting: *Deleted

## 2014-09-17 ENCOUNTER — Emergency Department (HOSPITAL_COMMUNITY): Payer: Medicare Other

## 2014-09-17 DIAGNOSIS — E119 Type 2 diabetes mellitus without complications: Secondary | ICD-10-CM | POA: Insufficient documentation

## 2014-09-17 DIAGNOSIS — Z791 Long term (current) use of non-steroidal anti-inflammatories (NSAID): Secondary | ICD-10-CM | POA: Insufficient documentation

## 2014-09-17 DIAGNOSIS — Z7982 Long term (current) use of aspirin: Secondary | ICD-10-CM | POA: Diagnosis not present

## 2014-09-17 DIAGNOSIS — M549 Dorsalgia, unspecified: Secondary | ICD-10-CM | POA: Diagnosis not present

## 2014-09-17 DIAGNOSIS — M1990 Unspecified osteoarthritis, unspecified site: Secondary | ICD-10-CM | POA: Insufficient documentation

## 2014-09-17 DIAGNOSIS — R9431 Abnormal electrocardiogram [ECG] [EKG]: Secondary | ICD-10-CM | POA: Insufficient documentation

## 2014-09-17 DIAGNOSIS — M546 Pain in thoracic spine: Secondary | ICD-10-CM | POA: Insufficient documentation

## 2014-09-17 LAB — HEPATIC FUNCTION PANEL
ALBUMIN: 4.2 g/dL (ref 3.5–5.2)
ALK PHOS: 97 U/L (ref 39–117)
ALT: 24 U/L (ref 0–35)
AST: 34 U/L (ref 0–37)
Bilirubin, Direct: 0.2 mg/dL (ref 0.0–0.5)
Indirect Bilirubin: 0.4 mg/dL (ref 0.3–0.9)
TOTAL PROTEIN: 7.6 g/dL (ref 6.0–8.3)
Total Bilirubin: 0.6 mg/dL (ref 0.3–1.2)

## 2014-09-17 LAB — CBC
HCT: 37.6 % (ref 36.0–46.0)
HEMOGLOBIN: 12.2 g/dL (ref 12.0–15.0)
MCH: 29.9 pg (ref 26.0–34.0)
MCHC: 32.4 g/dL (ref 30.0–36.0)
MCV: 92.2 fL (ref 78.0–100.0)
PLATELETS: 366 10*3/uL (ref 150–400)
RBC: 4.08 MIL/uL (ref 3.87–5.11)
RDW: 13 % (ref 11.5–15.5)
WBC: 7.2 10*3/uL (ref 4.0–10.5)

## 2014-09-17 LAB — BASIC METABOLIC PANEL
Anion gap: 11 (ref 5–15)
BUN: 13 mg/dL (ref 6–23)
CO2: 24 mmol/L (ref 19–32)
CREATININE: 0.84 mg/dL (ref 0.50–1.10)
Calcium: 9.2 mg/dL (ref 8.4–10.5)
Chloride: 105 mmol/L (ref 96–112)
GFR calc Af Amer: 79 mL/min — ABNORMAL LOW (ref >90)
GFR, EST NON AFRICAN AMERICAN: 68 mL/min — AB (ref >90)
Glucose, Bld: 108 mg/dL — ABNORMAL HIGH (ref 70–99)
Potassium: 4.8 mmol/L (ref 3.5–5.1)
SODIUM: 140 mmol/L (ref 135–145)

## 2014-09-17 LAB — URINALYSIS, ROUTINE W REFLEX MICROSCOPIC
Bilirubin Urine: NEGATIVE
Glucose, UA: NEGATIVE mg/dL
HGB URINE DIPSTICK: NEGATIVE
KETONES UR: NEGATIVE mg/dL
Nitrite: NEGATIVE
PH: 7.5 (ref 5.0–8.0)
Protein, ur: NEGATIVE mg/dL
SPECIFIC GRAVITY, URINE: 1.006 (ref 1.005–1.030)
Urobilinogen, UA: 0.2 mg/dL (ref 0.0–1.0)

## 2014-09-17 LAB — I-STAT TROPONIN, ED
TROPONIN I, POC: 0 ng/mL (ref 0.00–0.08)
Troponin i, poc: 0 ng/mL (ref 0.00–0.08)

## 2014-09-17 LAB — URINE MICROSCOPIC-ADD ON

## 2014-09-17 LAB — LIPASE, BLOOD: Lipase: 17 U/L (ref 11–59)

## 2014-09-17 MED ORDER — OXYCODONE-ACETAMINOPHEN 5-325 MG PO TABS: ORAL_TABLET | ORAL | Status: DC

## 2014-09-17 MED ORDER — DIAZEPAM 5 MG/ML IJ SOLN
5.0000 mg | Freq: Once | INTRAMUSCULAR | Status: AC
  Administered 2014-09-17: 5 mg via INTRAVENOUS
  Filled 2014-09-17: qty 2

## 2014-09-17 MED ORDER — IOHEXOL 300 MG/ML  SOLN
80.0000 mL | Freq: Once | INTRAMUSCULAR | Status: AC | PRN
  Administered 2014-09-17: 80 mL via INTRAVENOUS

## 2014-09-17 MED ORDER — DIAZEPAM 5 MG PO TABS: 5.0000 mg | ORAL_TABLET | Freq: Four times a day (QID) | ORAL | Status: DC | PRN

## 2014-09-17 MED ORDER — MORPHINE SULFATE 4 MG/ML IJ SOLN
4.0000 mg | Freq: Once | INTRAMUSCULAR | Status: AC
  Administered 2014-09-17: 4 mg via INTRAVENOUS
  Filled 2014-09-17: qty 1

## 2014-09-17 NOTE — ED Notes (Signed)
Pt c/o thoracic back pain x5 days. Movement aggravates pain, cannot get comfortable. Has been treating herself for heartburn in case that is what was causing the back pain but has had no relief. Also c/o L foot and ankle swelling x5 days. Denies known back injury.

## 2014-09-17 NOTE — ED Provider Notes (Signed)
Medical screening examination/treatment/procedure(s) were conducted as a shared visit with non-physician practitioner(s) and myself.  I personally evaluated the patient during the encounter.   EKG Interpretation   Date/Time:  Saturday September 17 2014 17:03:29 EDT Ventricular Rate:  69 PR Interval:  176 QRS Duration: 85 QT Interval:  473 QTC Calculation: 507 R Axis:   -59 Text Interpretation:  Sinus rhythm Abnormal R-wave progression, early  transition Inferior infarct, old Abnrm T, consider ischemia, anterolateral  lds Prolonged QT interval Confirmed by Zenia Resides  MD, Gid Schoffstall ([id number]) on  09/17/2014 5:12:55 PM     Patient here with son onset of thoracic back pain which began 5 days ago. Pain characterized as sharp and worse with movements. Denies any chest pain or anginal symptoms. Patient's EKG noted. Will obtain chest CT and disposition pending  Lacretia Leigh, MD 09/17/14 1911

## 2014-09-17 NOTE — Discharge Instructions (Signed)
Take percocet / and or valium for breakthrough pain, do not drink alcohol, drive, care for children or do other critical tasks while taking percocet and or valium.  Please be very careful not to fall! The pain medication and back pain puts you at risk for falls. Please rest as much as possible and try to not stay alone.   Please follow with your primary care doctor in the next 2 days for a check-up. They must obtain records for further management.   Do not hesitate to return to the Emergency Department for any new, worsening or concerning symptoms.

## 2014-09-17 NOTE — ED Notes (Signed)
Pt reports worsening lower back pain and left ankle/foot swelling for past 5 days. Pt denies CP, SOB, or other symptoms.

## 2014-09-17 NOTE — ED Notes (Signed)
Per [facility], PCP treated pt for GERD and epigastric pain yesterday.

## 2014-09-17 NOTE — ED Provider Notes (Signed)
CSN: [id number]     Arrival date & time 09/17/14  1559 History   First MD Initiated Contact with Patient 09/17/14 1659     Chief Complaint  Patient presents with  . Back Pain  . Foot Swelling     (Consider location/radiation/quality/duration/timing/severity/associated sxs/prior Treatment) HPI   Julie Schmidt is a 74 y.o. female complaining of severe thoracic back pain radiating around to the bilateral anterior chest onset 5 days ago rated at 9 out of 10, worsening in severity she takes aspirin at home with little relief. Pain is exacerbated by movement and certain positions. She denies fevers, chills, trauma, chest pain, shortness of breath, abdominal pain, nausea, vomiting, similar prior episodes. She had a craniotomy in December for meningioma and states that her weakness is changed from his baseline. However. she cannot explain how its different. Daughter reports that she had bilateral lower extremity pedal edema 5 days ago which resolved after elevation.  Past Medical History  Diagnosis Date  . Diabetes mellitus   . Headache   . Arthritis     knees, back   Past Surgical History  Procedure Laterality Date  . Cesarean section    . Appendectomy    . Abdominal hysterectomy    . Craniotomy Left 05/19/2014    Procedure: CRANIOTOMY TUMOR EXCISION with Curve;  Surgeon: Erline Levine, MD;  Location: [facility];  Service: Neurosurgery;  Laterality: Left;  Left frontal craniotomy for meningioma with brain lab  . Brain surgery  05/19/2014    Craniotomy for meningioma   Family History  Problem Relation Age of Onset  . Cancer Father    History  Substance Use Topics  . Smoking status: Never Smoker   . Smokeless tobacco: Never Used  . Alcohol Use: No   OB History    No data available     Review of Systems  10 systems reviewed and found to be negative, except as noted in the HPI.   Allergies  Review of patient's allergies indicates no known allergies.  Home Medications    Prior to Admission medications   Medication Sig Start Date End Date Taking? Authorizing Provider  aspirin 81 MG tablet Take 81 mg by mouth daily.   Yes Historical Provider, MD  naproxen sodium (ANAPROX) 220 MG tablet Take 220 mg by mouth daily as needed (pain).   Yes Historical Provider, MD  omeprazole (PRILOSEC) 40 MG capsule Take 1 capsule (40 mg total) by mouth daily. 09/16/14  Yes Timoteo Gaul, FNP  dexamethasone (DECADRON) 1 MG tablet Take 1 tablet (1 mg total) by mouth 2 (two) times daily with a meal. Patient not taking: Reported on 09/16/2014 06/28/14   Marletta Lor, MD  diazepam (VALIUM) 5 MG tablet Take 1 tablet (5 mg total) by mouth every 6 (six) hours as needed for anxiety (spasms). 09/17/14   Junie Engram, PA-C  levETIRAcetam (KEPPRA) 500 MG tablet Take 1 tablet (500 mg total) by mouth 2 (two) times daily. Patient not taking: Reported on 09/16/2014 05/22/14   Kristeen Miss, MD  oxyCODONE-acetaminophen (PERCOCET/ROXICET) 5-325 MG per tablet 1 to 2 tabs PO q6hrs  PRN for pain 09/17/14   Elmyra Ricks Anushree Dorsi, PA-C   BP 162/66 mmHg  Pulse 66  Temp(Src) 98.2 F (36.8 C) (Oral)  Resp 20  SpO2 94% Physical Exam  Constitutional: She is oriented to person, place, and time. She appears well-developed and well-nourished. No distress.  HENT:  Head: Normocephalic.  Mouth/Throat: Oropharynx is clear and moist.  Eyes:  Conjunctivae and EOM are normal.  Neck: Normal range of motion.  Cardiovascular: Normal rate, regular rhythm and intact distal pulses.   Pulmonary/Chest: Effort normal and breath sounds normal. No stridor. No respiratory distress. She has no wheezes. She has no rales. She exhibits no tenderness.  Abdominal: Soft. Bowel sounds are normal. She exhibits no distension and no mass. There is no tenderness. There is no rebound and no guarding.  Musculoskeletal: Normal range of motion. She exhibits tenderness.       Arms: Neurological: She is alert and oriented to person, place,  and time.  Psychiatric: She has a normal mood and affect.  Nursing note and vitals reviewed.   ED Course  Procedures (including critical care time) Labs Review Labs Reviewed  BASIC METABOLIC PANEL - Abnormal; Notable for the following:    Glucose, Bld 108 (*)    GFR calc non Af Amer 68 (*)    GFR calc Af Amer 79 (*)    All other components within normal limits  URINALYSIS, ROUTINE W REFLEX MICROSCOPIC - Abnormal; Notable for the following:    Leukocytes, UA TRACE (*)    All other components within normal limits  CBC  HEPATIC FUNCTION PANEL  LIPASE, BLOOD  URINE MICROSCOPIC-ADD ON  I-STAT TROPOININ, ED  I-STAT TROPOININ, ED    Imaging Review Ct Chest W Contrast  09/17/2014   CLINICAL DATA:  Thoracic back pain for 5 days.  EXAM: CT CHEST WITH CONTRAST  TECHNIQUE: Multidetector CT imaging of the chest was performed during intravenous contrast administration.  CONTRAST:  77mL OMNIPAQUE IOHEXOL 300 MG/ML  SOLN  COMPARISON:  March 24, 2014.  FINDINGS: No pneumothorax or pleural effusion is noted. No acute pulmonary disease is noted. There is no evidence of thoracic aortic dissection or aneurysm. Visualized portions of pulmonary arteries appear normal. No mediastinal mass or adenopathy is noted. Several hepatic cysts are noted. Otherwise visualized portion of upper abdomen appears normal. Mild multilevel degenerative disc disease is noted in the lower thoracic spine.  IMPRESSION: Mild multilevel degenerative disc disease is noted in the lower thoracic spine. No other significant abnormality seen in the chest.   Electronically Signed   By: Marijo Conception, M.D.   On: 09/17/2014 20:26     EKG Interpretation   Date/Time:  Saturday September 17 2014 17:03:29 EDT Ventricular Rate:  69 PR Interval:  176 QRS Duration: 85 QT Interval:  473 QTC Calculation: 507 R Axis:   -59 Text Interpretation:  Sinus rhythm Abnormal R-wave progression, early  transition Inferior infarct, old Abnrm T, consider  ischemia, anterolateral  lds Prolonged QT interval Confirmed by Zenia Resides  MD, ANTHONY ([id number]) on  09/17/2014 5:12:55 PM      MDM   Final diagnoses:  Abnormal EKG  Bilateral thoracic back pain    Filed Vitals:   09/17/14 1813 09/17/14 1922 09/17/14 2043 09/17/14 2226  BP: 169/71 136/64 158/75 162/66  Pulse: 64 72 68 66  Temp:      TempSrc:      Resp: 20 18 18 20   SpO2: 100% 95% 95% 94%    Medications  morphine 4 MG/ML injection 4 mg (4 mg Intravenous Given 09/17/14 1810)  diazepam (VALIUM) injection 5 mg (5 mg Intravenous Given 09/17/14 1916)  iohexol (OMNIPAQUE) 300 MG/ML solution 80 mL (80 mLs Intravenous Contrast Given 09/17/14 1934)    RONISHA HERRINGSHAW is a pleasant 74 y.o. female presenting with sever thoracic back pain that is positional worsening x5 days. Patient denies chest  pain, shortness of breath, dyspnea on exertion, lightheadedness. There are no anginal symptoms. Her EKG is grossly normal. Her troponin is negative. Repeat EKG with similar morphology.blood work is unremarkable, chest CT with multilevel DJD.  Cardiology consult from Dr. Claiborne Billings appreciated: He is reviewed the EKG and will expedite outpatient care.  Evaluation does not show pathology that would require ongoing emergent intervention or inpatient treatment. Pt is hemodynamically stable and mentating appropriately. Discussed findings and plan with patient/guardian, who agrees with care plan. All questions answered. Return precautions discussed and outpatient follow up given.   Discharge Medication List as of 09/17/2014 10:16 PM    START taking these medications   Details  diazepam (VALIUM) 5 MG tablet Take 1 tablet (5 mg total) by mouth every 6 (six) hours as needed for anxiety (spasms)., Starting 09/17/2014, Until Discontinued, Print    oxyCODONE-acetaminophen (PERCOCET/ROXICET) 5-325 MG per tablet 1 to 2 tabs PO q6hrs  PRN for pain, Print             Monico Blitz, PA-C 09/17/14 2345

## 2014-09-18 ENCOUNTER — Other Ambulatory Visit: Payer: Self-pay | Admitting: Family

## 2014-09-18 DIAGNOSIS — K2970 Gastritis, unspecified, without bleeding: Principal | ICD-10-CM

## 2014-09-18 DIAGNOSIS — B9681 Helicobacter pylori [H. pylori] as the cause of diseases classified elsewhere: Secondary | ICD-10-CM

## 2014-09-18 MED ORDER — AMOXICILLIN 500 MG PO TABS: 1000.0000 mg | ORAL_TABLET | Freq: Two times a day (BID) | ORAL | Status: AC

## 2014-09-18 MED ORDER — CLARITHROMYCIN 500 MG PO TABS: 500.0000 mg | ORAL_TABLET | Freq: Two times a day (BID) | ORAL | Status: DC

## 2014-09-19 ENCOUNTER — Other Ambulatory Visit: Payer: Self-pay

## 2014-09-19 DIAGNOSIS — R0789 Other chest pain: Secondary | ICD-10-CM

## 2014-10-17 ENCOUNTER — Encounter: Payer: Self-pay | Admitting: Internal Medicine

## 2014-11-02 ENCOUNTER — Encounter: Payer: Self-pay | Admitting: Cardiology

## 2014-11-02 ENCOUNTER — Ambulatory Visit (INDEPENDENT_AMBULATORY_CARE_PROVIDER_SITE_OTHER): Payer: Medicare Other | Admitting: Cardiology

## 2014-11-02 VITALS — BP 178/84 | HR 71 | Ht 61.5 in | Wt 134.8 lb

## 2014-11-02 DIAGNOSIS — R079 Chest pain, unspecified: Secondary | ICD-10-CM | POA: Diagnosis not present

## 2014-11-02 DIAGNOSIS — IMO0001 Reserved for inherently not codable concepts without codable children: Secondary | ICD-10-CM

## 2014-11-02 DIAGNOSIS — R030 Elevated blood-pressure reading, without diagnosis of hypertension: Secondary | ICD-10-CM

## 2014-11-02 DIAGNOSIS — I4581 Long QT syndrome: Secondary | ICD-10-CM | POA: Diagnosis not present

## 2014-11-02 DIAGNOSIS — R9431 Abnormal electrocardiogram [ECG] [EKG]: Secondary | ICD-10-CM | POA: Diagnosis not present

## 2014-11-02 DIAGNOSIS — E119 Type 2 diabetes mellitus without complications: Secondary | ICD-10-CM
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
## 2014-11-02 NOTE — Patient Instructions (Signed)
Medication Instructions:  Your physician recommends that you continue on your current medications as directed. Please refer to the Current Medication list given to you today.   Labwork: NONE  Testing/Procedures: Your physician has requested that you have a lexiscan myoview. For further information please visit [email or web address]. Please follow instruction sheet, as given.   Follow-Up: Follow up with Dr. Marlou Porch as needed.  Any Other Special Instructions Will Be Listed Below (If Applicable).

## 2014-11-15 ENCOUNTER — Telehealth (HOSPITAL_COMMUNITY): Payer: Self-pay

## 2014-11-15 NOTE — Telephone Encounter (Signed)
Patient given detailed instructions per Myocardial Perfusion Study Information Sheet for test on 11-17-2014 at 8:15am. Patient verbalized understanding. [name], [name]

## 2014-11-17 ENCOUNTER — Ambulatory Visit (HOSPITAL_COMMUNITY): Payer: Medicare Other | Attending: Internal Medicine

## 2014-11-17 VITALS — Ht 61.5 in | Wt 134.0 lb

## 2014-11-17 DIAGNOSIS — R079 Chest pain, unspecified: Secondary | ICD-10-CM | POA: Diagnosis not present

## 2014-11-17 DIAGNOSIS — E119 Type 2 diabetes mellitus without complications: Secondary | ICD-10-CM | POA: Insufficient documentation

## 2014-11-17 DIAGNOSIS — R0609 Other forms of dyspnea: Secondary | ICD-10-CM | POA: Insufficient documentation

## 2014-11-17 DIAGNOSIS — R9439 Abnormal result of other cardiovascular function study: Secondary | ICD-10-CM | POA: Diagnosis not present

## 2014-11-17 DIAGNOSIS — G4440 Drug-induced headache, not elsewhere classified, not intractable: Secondary | ICD-10-CM

## 2014-11-17 DIAGNOSIS — R0602 Shortness of breath: Secondary | ICD-10-CM

## 2014-11-17 LAB — MYOCARDIAL PERFUSION IMAGING
CSEPPHR: 121 {beats}/min
LV sys vol: 15 mL
LVDIAVOL: 65 mL
NUC STRESS TID: 0.91
Nuc Stress EF: 77 %
RATE: 0.29
Rest HR: 57 {beats}/min
SDS: 5
SRS: 3
SSS: 8

## 2014-11-17 MED ORDER — REGADENOSON 0.4 MG/5ML IV SOLN
0.4000 mg | Freq: Once | INTRAVENOUS | Status: AC
  Administered 2014-11-17: 0.4 mg via INTRAVENOUS

## 2014-11-17 MED ORDER — AMINOPHYLLINE 25 MG/ML IV SOLN
75.0000 mg | Freq: Once | INTRAVENOUS | Status: AC
  Administered 2014-11-17: 75 mg via INTRAVENOUS

## 2014-11-17 MED ORDER — TECHNETIUM TC 99M SESTAMIBI GENERIC - CARDIOLITE
11.0000 | Freq: Once | INTRAVENOUS | Status: AC | PRN
  Administered 2014-11-17: 11 via INTRAVENOUS

## 2014-11-17 MED ORDER — TECHNETIUM TC 99M SESTAMIBI GENERIC - CARDIOLITE
33.0000 | Freq: Once | INTRAVENOUS | Status: AC | PRN
  Administered 2014-11-17: 33 via INTRAVENOUS

## 2014-11-18 ENCOUNTER — Ambulatory Visit (INDEPENDENT_AMBULATORY_CARE_PROVIDER_SITE_OTHER): Payer: Medicare Other | Admitting: Internal Medicine

## 2014-11-18 ENCOUNTER — Encounter: Payer: Self-pay | Admitting: Internal Medicine

## 2014-11-18 DIAGNOSIS — I4581 Long QT syndrome: Secondary | ICD-10-CM | POA: Diagnosis not present

## 2014-11-18 DIAGNOSIS — E119 Type 2 diabetes mellitus without complications: Secondary | ICD-10-CM

## 2014-11-18 DIAGNOSIS — R030 Elevated blood-pressure reading, without diagnosis of hypertension: Secondary | ICD-10-CM

## 2014-11-18 DIAGNOSIS — R0789 Other chest pain: Secondary | ICD-10-CM | POA: Diagnosis not present

## 2014-11-18 DIAGNOSIS — R9431 Abnormal electrocardiogram [ECG] [EKG]: Secondary | ICD-10-CM

## 2014-11-18 DIAGNOSIS — IMO0001 Reserved for inherently not codable concepts without codable children: Secondary | ICD-10-CM

## 2014-11-18 LAB — LIPID PANEL
CHOL/HDL RATIO: 4
Cholesterol: 205 mg/dL — ABNORMAL HIGH (ref 0–200)
HDL: 47.2 mg/dL (ref >39.00)
LDL CALC: 133 mg/dL — AB (ref 0–99)
NONHDL: 157.8
Triglycerides: 123 mg/dL (ref 0.0–149.0)
VLDL: 24.6 mg/dL (ref 0.0–40.0)

## 2014-11-18 LAB — HEMOGLOBIN A1C: HEMOGLOBIN A1C: 6.1 % (ref 4.6–6.5)

## 2014-11-18 MED ORDER — LISINOPRIL 10 MG PO TABS: 10.0000 mg | ORAL_TABLET | Freq: Every day | ORAL | Status: DC

## 2014-11-18 MED ORDER — ATORVASTATIN CALCIUM 20 MG PO TABS: 20.0000 mg | ORAL_TABLET | Freq: Every day | ORAL | Status: DC

## 2014-11-18 NOTE — Patient Instructions (Signed)
Limit your sodium (Salt) intake  Please check your blood pressure on a regular basis.  If it is consistently greater than 150/90, please make an office appointment.  Return in one month for follow-up  Cardiology follow-up as scheduled

## 2014-11-18 NOTE — Progress Notes (Signed)
Pre visit review using our clinic review tool, if applicable. No additional management support is needed unless otherwise documented below in the visit note. 

## 2014-11-18 NOTE — Progress Notes (Signed)
Subjective:    Patient ID: TERSA Schmidt, female    DOB: 1941-02-26, 74 y.o.   MRN: [id number]  HPI   74 year old patient who is seen today for follow-up.  She has concerns about elevated blood pressure.  She has been seen recently for epigastric and chest pain with radiation to the back.  She was treated empirically for possible H. pylori gastritis and has completed antibiotic therapy She presented to the ED complaining of chest and back pain.  An EKG revealed some inferior wall abnormalities.  She has had a subsequent nuclear stress test   There is a medium defect of moderate severity present in the basal inferior, mid inferior and apical inferior location. The defect is reversible.    Study Impression Myocardial perfusion is abnormal. Findings consistent with ischemia. This is an intermediate risk study. Overall left ventricular systolic function was normal. LV cavity size is normal. The left ventricular ejection fraction is hyperdynamic (>65%). There is no prior study for comparison   The patient's chest pain is quite atypical and she describes as quite sharp, it causes some shortness of breath.  Pain is non-exertional.  Since her cardiology visit.  She has had no exertional chest pain.  She does have some mild DOE.  Her chief concern today is elevated blood pressure She does have diet-controlled diabetes. Other complaints include left hip pain of 7 years duration.  She also complains some mild pedal edema.  Past Medical History  Diagnosis Date  . Diabetes mellitus   . Headache   . Arthritis     knees, back    History   Social History  . Marital Status: Widowed    Spouse Name: N/A  . Number of Children: N/A  . Years of Education: N/A   Occupational History  . Not on file.   Social History Main Topics  . Smoking status: Never Smoker   . Smokeless tobacco: Never Used  . Alcohol Use: No  . Drug Use: No  . Sexual Activity: Not on file   Other Topics Concern  . Not  on file   Social History Narrative    Past Surgical History  Procedure Laterality Date  . Cesarean section    . Appendectomy    . Abdominal hysterectomy    . Craniotomy Left 05/19/2014    Procedure: CRANIOTOMY TUMOR EXCISION with Curve;  Surgeon: Erline Levine, MD;  Location: [facility];  Service: Neurosurgery;  Laterality: Left;  Left frontal craniotomy for meningioma with brain lab  . Brain surgery  05/19/2014    Craniotomy for meningioma    Family History  Problem Relation Age of Onset  . Cancer Father     Allergies  Allergen Reactions  . Oxycodone-Acetaminophen Nausea And Vomiting    Current Outpatient Prescriptions on File Prior to Visit  Medication Sig Dispense Refill  . aspirin 81 MG tablet Take 81 mg by mouth daily.     No current facility-administered medications on file prior to visit.    BP 130/70 mmHg  Pulse 64  Temp(Src) 98.7 F (37.1 C) (Oral)  Wt 134 lb (60.782 kg)     Review of Systems  Constitutional: Negative.   HENT: Negative for congestion, dental problem, hearing loss, rhinorrhea, sinus pressure, sore throat and tinnitus.   Eyes: Negative for pain, discharge and visual disturbance.  Respiratory: Negative for cough and shortness of breath.   Cardiovascular: Positive for chest pain and leg swelling. Negative for palpitations.  Gastrointestinal: Negative for nausea,  vomiting, abdominal pain, diarrhea, constipation, blood in stool and abdominal distention.  Genitourinary: Negative for dysuria, urgency, frequency, hematuria, flank pain, vaginal bleeding, vaginal discharge, difficulty urinating, vaginal pain and pelvic pain.  Musculoskeletal: Positive for back pain. Negative for joint swelling, arthralgias and gait problem.       Left hip pain  Skin: Negative for rash.  Neurological: Negative for dizziness, syncope, speech difficulty, weakness, numbness and headaches.  Hematological: Negative for adenopathy.  Psychiatric/Behavioral: Negative for  behavioral problems, dysphoric mood and agitation. The patient is not nervous/anxious.        Objective:   Physical Exam  Constitutional: She is oriented to person, place, and time. She appears well-developed and well-nourished.  Blood pressure 160/70 both arms  HENT:  Head: Normocephalic.  Right Ear: External ear normal.  Left Ear: External ear normal.  Mouth/Throat: Oropharynx is clear and moist.  Eyes: Conjunctivae and EOM are normal. Pupils are equal, round, and reactive to light.  Neck: Normal range of motion. Neck supple. No thyromegaly present.  Cardiovascular: Normal rate, regular rhythm, normal heart sounds and intact distal pulses.   Pedal pulses full Occasional ectopics  Pulmonary/Chest: Effort normal and breath sounds normal.  Abdominal: Soft. Bowel sounds are normal. She exhibits no mass. There is no tenderness.  Musculoskeletal: Normal range of motion. She exhibits edema.  Trace left ankle edema  Lymphadenopathy:    She has no cervical adenopathy.  Neurological: She is alert and oriented to person, place, and time.  Skin: Skin is warm and dry. No rash noted.  Psychiatric: She has a normal mood and affect. Her behavior is normal.          Assessment & Plan:   Probable coronary artery disease with intermediate probability nuclear stress test.  Chest pain sounds nonischemic.  Patient does have diet-controlled diabetes.  Will initiate aggressive risk factor modification.  We'll add statin therapy.  Continue aspirin.  Cardiology to review stress test and consider further management Hypertension.  Probably best to avoid electrolyte abnormalities (diuretics) and also bradycardia (BB) in view of her prolonged QT interval.  Will place on lisinopril for optimal blood pressure control.  Will place on low salt diet Atypical chest pain History of H. pylori positive antibody.  This is a nonspecific finding  Cardiology follow-up Recheck here 4 weeks

## 2014-11-24 ENCOUNTER — Encounter: Payer: Self-pay | Admitting: Cardiology

## 2014-11-24 ENCOUNTER — Ambulatory Visit (INDEPENDENT_AMBULATORY_CARE_PROVIDER_SITE_OTHER): Payer: Medicare Other | Admitting: Cardiology

## 2014-11-24 ENCOUNTER — Encounter: Payer: Self-pay | Admitting: *Deleted

## 2014-11-24 VITALS — BP 150/82 | HR 71 | Ht 61.0 in | Wt 134.2 lb

## 2014-11-24 DIAGNOSIS — R079 Chest pain, unspecified: Secondary | ICD-10-CM | POA: Diagnosis not present

## 2014-11-24 DIAGNOSIS — Z01812 Encounter for preprocedural laboratory examination: Secondary | ICD-10-CM

## 2014-11-24 DIAGNOSIS — R931 Abnormal findings on diagnostic imaging of heart and coronary circulation: Secondary | ICD-10-CM

## 2014-11-24 DIAGNOSIS — R9439 Abnormal result of other cardiovascular function study: Secondary | ICD-10-CM

## 2014-11-24 LAB — CBC
HCT: 36.2 % (ref 36.0–46.0)
Hemoglobin: 11.9 g/dL — ABNORMAL LOW (ref 12.0–15.0)
MCHC: 33 g/dL (ref 30.0–36.0)
MCV: 88.4 fl (ref 78.0–100.0)
Platelets: 327 10*3/uL (ref 150.0–400.0)
RBC: 4.1 Mil/uL (ref 3.87–5.11)
RDW: 14.2 % (ref 11.5–15.5)
WBC: 4.6 10*3/uL (ref 4.0–10.5)

## 2014-11-24 LAB — BASIC METABOLIC PANEL
BUN: 15 mg/dL (ref 6–23)
CO2: 25 mEq/L (ref 19–32)
Calcium: 9.7 mg/dL (ref 8.4–10.5)
Chloride: 107 mEq/L (ref 96–112)
Creatinine, Ser: 0.75 mg/dL (ref 0.40–1.20)
GFR: 97.76 mL/min (ref >60.00)
Glucose, Bld: 108 mg/dL — ABNORMAL HIGH (ref 70–99)
Potassium: 3.8 mEq/L (ref 3.5–5.1)
SODIUM: 138 meq/L (ref 135–145)

## 2014-11-24 LAB — PROTIME-INR
INR: 1 ratio (ref 0.8–1.0)
Prothrombin Time: 11.3 s (ref 9.6–13.1)

## 2014-11-24 MED ORDER — METOPROLOL SUCCINATE ER 25 MG PO TB24: 25.0000 mg | ORAL_TABLET | Freq: Every day | ORAL | Status: DC
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
## 2014-11-24 NOTE — Patient Instructions (Addendum)
Medication Instructions:  Please start Metoprolol succinate 25 mg a day.  Continue all other medications as listed.  Labwork: Please have blood work today.  Testing/Procedures: Your physician has requested that you have a cardiac catheterization. Cardiac catheterization is used to diagnose and/or treat various heart conditions. Doctors may recommend this procedure for a number of different reasons. The most common reason is to evaluate chest pain. Chest pain can be a symptom of coronary artery disease (CAD), and cardiac catheterization can show whether plaque is narrowing or blocking your heart's arteries. This procedure is also used to evaluate the valves, as well as measure the blood flow and oxygen levels in different parts of your heart. For further information please visit [email or web address]. Please follow instruction sheet, as given.  Follow-Up: After your cardiac cath.  Thank you for choosing [facility]!!

## 2014-11-25 ENCOUNTER — Ambulatory Visit (HOSPITAL_COMMUNITY)
Admission: RE | Admit: 2014-11-25 | Discharge: 2014-11-25 | Disposition: A | Discharge status: Home / Self Care | Payer: Medicare Other | Source: Ambulatory Visit | Attending: Cardiology | Admitting: Cardiology

## 2014-11-25 ENCOUNTER — Encounter (HOSPITAL_COMMUNITY)
Admission: RE | Disposition: A | Discharge status: Home / Self Care | Payer: Medicare Other | Source: Ambulatory Visit | Attending: Cardiology

## 2014-11-25 ENCOUNTER — Encounter (HOSPITAL_COMMUNITY): Payer: Self-pay | Admitting: Cardiology

## 2014-11-25 DIAGNOSIS — R9439 Abnormal result of other cardiovascular function study: Secondary | ICD-10-CM | POA: Diagnosis present

## 2014-11-25 DIAGNOSIS — R030 Elevated blood-pressure reading, without diagnosis of hypertension: Secondary | ICD-10-CM | POA: Diagnosis not present

## 2014-11-25 DIAGNOSIS — Z7982 Long term (current) use of aspirin: Secondary | ICD-10-CM | POA: Diagnosis not present

## 2014-11-25 DIAGNOSIS — E119 Type 2 diabetes mellitus without complications: Secondary | ICD-10-CM | POA: Diagnosis not present

## 2014-11-25 DIAGNOSIS — R079 Chest pain, unspecified: Secondary | ICD-10-CM

## 2014-11-25 DIAGNOSIS — R0789 Other chest pain: Secondary | ICD-10-CM | POA: Insufficient documentation

## 2014-11-25 DIAGNOSIS — R931 Abnormal findings on diagnostic imaging of heart and coronary circulation: Secondary | ICD-10-CM | POA: Diagnosis not present

## 2014-11-25 HISTORY — PX: CARDIAC CATHETERIZATION: SHX172

## 2014-11-25 LAB — GLUCOSE, CAPILLARY: Glucose-Capillary: 95 mg/dL (ref 65–99)

## 2014-11-25 SURGERY — LEFT HEART CATH AND CORONARY ANGIOGRAPHY
Anesthesia: LOCAL

## 2014-11-25 MED ORDER — IOHEXOL 300 MG/ML  SOLN
INTRAMUSCULAR | Status: DC | PRN
Start: 2014-11-25 — End: 2014-11-25
  Administered 2014-11-25: 65 mL via INTRA_ARTERIAL

## 2014-11-25 MED ORDER — MIDAZOLAM HCL 2 MG/2ML IJ SOLN
INTRAMUSCULAR | Status: AC
Start: 2014-11-25 — End: 2014-11-25
  Filled 2014-11-25: qty 2

## 2014-11-25 MED ORDER — VERAPAMIL HCL 2.5 MG/ML IV SOLN
INTRAVENOUS | Status: DC | PRN
  Administered 2014-11-25: 10:00:00 via INTRA_ARTERIAL

## 2014-11-25 MED ORDER — FENTANYL CITRATE (PF) 100 MCG/2ML IJ SOLN
INTRAMUSCULAR | Status: DC | PRN
  Administered 2014-11-25 (×2): 25 ug via INTRAVENOUS

## 2014-11-25 MED ORDER — HEPARIN (PORCINE) IN NACL 2-0.9 UNIT/ML-% IJ SOLN
INTRAMUSCULAR | Status: AC
  Filled 2014-11-25: qty 500

## 2014-11-25 MED ORDER — DIAZEPAM 5 MG PO TABS: 5.0000 mg | ORAL_TABLET | Freq: Once | ORAL | Status: DC

## 2014-11-25 MED ORDER — LIDOCAINE HCL (PF) 1 % IJ SOLN
INTRAMUSCULAR | Status: DC | PRN
  Administered 2014-11-25: 5 mL

## 2014-11-25 MED ORDER — ASPIRIN 81 MG PO CHEW: 81.0000 mg | CHEWABLE_TABLET | ORAL | Status: AC

## 2014-11-25 MED ORDER — SODIUM CHLORIDE 0.9 % IJ SOLN: 3.0000 mL | Freq: Two times a day (BID) | INTRAMUSCULAR | Status: DC

## 2014-11-25 MED ORDER — HEPARIN SODIUM (PORCINE) 1000 UNIT/ML IJ SOLN
INTRAMUSCULAR | Status: DC | PRN
  Administered 2014-11-25: 3000 [IU] via INTRAVENOUS

## 2014-11-25 MED ORDER — SODIUM CHLORIDE 0.9 % WEIGHT BASED INFUSION: 3.0000 mL/kg/h | INTRAVENOUS | Status: AC

## 2014-11-25 MED ORDER — FENTANYL CITRATE (PF) 100 MCG/2ML IJ SOLN
INTRAMUSCULAR | Status: AC
  Filled 2014-11-25: qty 2

## 2014-11-25 MED ORDER — SODIUM CHLORIDE 0.9 % WEIGHT BASED INFUSION
1.0000 mL/kg/h | INTRAVENOUS | Status: DC
Start: 2014-11-26 — End: 2014-12-07

## 2014-11-25 MED ORDER — SODIUM CHLORIDE 0.9 % IJ SOLN: 3.0000 mL | INTRAMUSCULAR | Status: DC | PRN

## 2014-11-25 MED ORDER — SODIUM CHLORIDE 0.9 % WEIGHT BASED INFUSION
3.0000 mL/kg/h | INTRAVENOUS | Status: AC
  Administered 2014-11-25: 3 mL/kg/h via INTRAVENOUS

## 2014-11-25 MED ORDER — MIDAZOLAM HCL 2 MG/2ML IJ SOLN
INTRAMUSCULAR | Status: DC | PRN
  Administered 2014-11-25: 2 mg via INTRAVENOUS
  Administered 2014-11-25: 1 mg via INTRAVENOUS

## 2014-11-25 MED ORDER — SODIUM CHLORIDE 0.9 % IV SOLN: 250.0000 mL | INTRAVENOUS | Status: DC | PRN

## 2014-11-25 MED ORDER — DIAZEPAM 5 MG PO TABS
ORAL_TABLET | ORAL | Status: AC
  Filled 2014-11-25: qty 1

## 2014-11-25 MED ORDER — HEPARIN SODIUM (PORCINE) 1000 UNIT/ML IJ SOLN
INTRAMUSCULAR | Status: AC
  Filled 2014-11-25: qty 1

## 2014-11-25 MED ORDER — ASPIRIN 81 MG PO CHEW
CHEWABLE_TABLET | ORAL | Status: AC
  Filled 2014-11-25: qty 1

## 2014-11-25 MED ORDER — MIDAZOLAM HCL 2 MG/2ML IJ SOLN
INTRAMUSCULAR | Status: AC
  Filled 2014-11-25: qty 2

## 2014-11-25 MED ORDER — VERAPAMIL HCL 2.5 MG/ML IV SOLN
INTRAVENOUS | Status: AC
  Filled 2014-11-25: qty 2

## 2014-11-25 MED ORDER — LIDOCAINE HCL (PF) 1 % IJ SOLN
INTRAMUSCULAR | Status: AC
  Filled 2014-11-25: qty 30

## 2014-11-25 SURGICAL SUPPLY — 15 items
CATH INFINITI 5 FR JL3.5 (CATHETERS) ×2 IMPLANT
CATH INFINITI 5FR ANG PIGTAIL (CATHETERS) ×2 IMPLANT
CATH INFINITI 5FR MULTPACK ANG (CATHETERS) IMPLANT
CATH INFINITI JR4 5F (CATHETERS) ×2 IMPLANT
DEVICE RAD COMP TR BAND LRG (VASCULAR PRODUCTS) ×2 IMPLANT
GLIDESHEATH SLEND SS 6F .021 (SHEATH) ×2 IMPLANT
KIT HEART LEFT (KITS) ×2 IMPLANT
PACK CARDIAC CATHETERIZATION (CUSTOM PROCEDURE TRAY) ×2 IMPLANT
SHEATH PINNACLE 5F 10CM (SHEATH) IMPLANT
SYR MEDRAD MARK V 150ML (SYRINGE) ×2 IMPLANT
TRANSDUCER W/STOPCOCK (MISCELLANEOUS) ×2 IMPLANT
TUBING CIL FLEX 10 FLL-RA (TUBING) ×2 IMPLANT
WIRE EMERALD 3MM-J .035X150CM (WIRE) IMPLANT
WIRE HI TORQ VERSACORE-J 145CM (WIRE) ×1 IMPLANT
WIRE SAFE-T 1.5MM-J .035X260CM (WIRE) ×2 IMPLANT

## 2014-11-25 NOTE — Interval H&P Note (Signed)
History and Physical Interval Note:  11/25/2014 8:05 AM  Julie Schmidt  has presented today for surgery, with the diagnosis of abnormal myoview, cp  The various methods of treatment have been discussed with the patient and family. After consideration of risks, benefits and other options for treatment, the patient has consented to  Procedure(s): Left Heart Cath and Coronary Angiography (N/A) as a surgical intervention .  The patient's history has been reviewed, patient examined, no change in status, stable for surgery.  I have reviewed the patient's chart and labs.  Questions were answered to the patient's satisfaction.     SKAINS, MARK
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
## 2014-11-25 NOTE — Discharge Instructions (Signed)
Radial Site Care °Refer to this sheet in the next few weeks. These instructions provide you with information on caring for yourself after your procedure. Your caregiver may also give you more specific instructions. Your treatment has been planned according to current medical practices, but problems sometimes occur. Call your caregiver if you have any problems or questions after your procedure. °HOME CARE INSTRUCTIONS °· You may shower the day after the procedure. Remove the bandage (dressing) and gently wash the site with plain soap and water. Gently pat the site dry. °· Do not apply powder or lotion to the site. °· Do not submerge the affected site in water for 3 to 5 days. °· Inspect the site at least twice daily. °· Do not flex or bend the affected arm for 24 hours. °· No lifting over 5 pounds (2.3 kg) for 5 days after your procedure. °· Do not drive home if you are discharged the same day of the procedure. Have someone else drive you. °· You may drive 24 hours after the procedure unless otherwise instructed by your caregiver. °· Do not operate machinery or power tools for 24 hours. °· A responsible adult should be with you for the first 24 hours after you arrive home. °What to expect: °· Any bruising will usually fade within 1 to 2 weeks. °· Blood that collects in the tissue (hematoma) may be painful to the touch. It should usually decrease in size and tenderness within 1 to 2 weeks. °SEEK IMMEDIATE MEDICAL CARE IF: °· You have unusual pain at the radial site. °· You have redness, warmth, swelling, or pain at the radial site. °· You have drainage (other than a small amount of blood on the dressing). °· You have chills. °· You have a fever or persistent symptoms for more than 72 hours. °· You have a fever and your symptoms suddenly get worse. °· Your arm becomes pale, cool, tingly, or numb. °· You have heavy bleeding from the site. Hold pressure on the site. °Document Released: 06/29/2010 Document Revised:  08/19/2011 Document Reviewed: 06/29/2010 °Exit[employer]® Patient Information ©2015 [employer]. This information is not intended to replace advice given to you by your health care provider. Make sure you discuss any questions you have with your health care provider. ° °

## 2014-11-25 NOTE — Research (Signed)
CAD/LAD Informed Consent   Subject Name: Julie Schmidt  Subject met inclusion and exclusion criteria.  The informed consent form, study requirements and expectations were reviewed with the subject and questions and concerns were addressed prior to the signing of the consent form.  The subject verbalized understanding of the trail requirements.  The subject agreed to participate in the CAD/LAD trial and signed the informed consent.  The informed consent was obtained prior to performance of any protocol-specific procedures for the subject.  A copy of the signed informed consent was given to the subject and a copy was placed in the subject's medical record.  Danai Gotto 11/25/2014, 08:05 AM

## 2014-11-28 MED FILL — Heparin Sodium (Porcine) 2 Unit/ML in Sodium Chloride 0.9%: INTRAMUSCULAR | Qty: 1500 | Status: AC

## 2014-12-16 ENCOUNTER — Ambulatory Visit (INDEPENDENT_AMBULATORY_CARE_PROVIDER_SITE_OTHER): Payer: Medicare Other | Admitting: Internal Medicine

## 2014-12-16 ENCOUNTER — Encounter: Payer: Self-pay | Admitting: Internal Medicine

## 2014-12-16 ENCOUNTER — Ambulatory Visit: Payer: Medicare Other | Admitting: Internal Medicine

## 2014-12-16 VITALS — BP 132/70 | HR 57 | Temp 98.5°F | Resp 18 | Ht 61.0 in | Wt 135.0 lb

## 2014-12-16 DIAGNOSIS — M549 Dorsalgia, unspecified: Secondary | ICD-10-CM

## 2014-12-16 DIAGNOSIS — G8929 Other chronic pain: Secondary | ICD-10-CM | POA: Diagnosis not present

## 2014-12-16 DIAGNOSIS — R030 Elevated blood-pressure reading, without diagnosis of hypertension: Secondary | ICD-10-CM | POA: Diagnosis not present

## 2014-12-16 DIAGNOSIS — E119 Type 2 diabetes mellitus without complications: Secondary | ICD-10-CM | POA: Diagnosis not present

## 2014-12-16 DIAGNOSIS — IMO0001 Reserved for inherently not codable concepts without codable children: Secondary | ICD-10-CM

## 2014-12-16 LAB — SEDIMENTATION RATE: Sed Rate: 22 mm/hr (ref 0–22)

## 2014-12-16 NOTE — Progress Notes (Signed)
Subjective:    Patient ID: Julie Schmidt, female    DOB: 1940-07-19, 74 y.o.   MRN: [id number]  HPI  74 year old patient who is seen today in follow-up.  Since her last visit here, she has had a heart catheterization due to an abnormal nuclear stress test.  This revealed normal coronary arteries She has hypertension and has been on lisinopril.  She has developed a chronic cough since initiating this therapy.  She is also on metoprolol per cardiology No complaints of chest pain Her chief complaints today in addition to cough are pain in the ankles but also back and hip areas  Past Medical History  Diagnosis Date  . Diabetes mellitus   . Headache   . Arthritis     knees, back    History   Social History  . Marital Status: Widowed    Spouse Name: N/A  . Number of Children: N/A  . Years of Education: N/A   Occupational History  . Not on file.   Social History Main Topics  . Smoking status: Never Smoker   . Smokeless tobacco: Never Used  . Alcohol Use: No  . Drug Use: No  . Sexual Activity: Not on file   Other Topics Concern  . Not on file   Social History Narrative    Past Surgical History  Procedure Laterality Date  . Cesarean section    . Appendectomy    . Abdominal hysterectomy    . Craniotomy Left 05/19/2014    Procedure: CRANIOTOMY TUMOR EXCISION with Curve;  Surgeon: Erline Levine, MD;  Location: [facility];  Service: Neurosurgery;  Laterality: Left;  Left frontal craniotomy for meningioma with brain lab  . Brain surgery  05/19/2014    Craniotomy for meningioma  . Cardiac catheterization N/A 11/25/2014    Procedure: Left Heart Cath and Coronary Angiography;  Surgeon: Jerline Pain, MD;  Location: [facility];  Service: Cardiovascular;  Laterality: N/A;    Family History  Problem Relation Age of Onset  . Cancer Father     Allergies  Allergen Reactions  . Oxycodone-Acetaminophen Nausea And Vomiting    Current Outpatient Prescriptions on File  Prior to Visit  Medication Sig Dispense Refill  . aspirin 81 MG tablet Take 81 mg by mouth daily.    Marland Kitchen atorvastatin (LIPITOR) 20 MG tablet Take 1 tablet (20 mg total) by mouth daily. 90 tablet 3  . lisinopril (PRINIVIL,ZESTRIL) 10 MG tablet Take 1 tablet (10 mg total) by mouth daily. 90 tablet 3  . metoprolol succinate (TOPROL-XL) 25 MG 24 hr tablet Take 1 tablet (25 mg total) by mouth daily. 30 tablet 6   No current facility-administered medications on file prior to visit.    BP 132/70 mmHg  Pulse 57  Temp(Src) 98.5 F (36.9 C) (Oral)  Resp 18  Ht 5\' 1"  (1.549 m)  Wt 135 lb (61.236 kg)  BMI 25.52 kg/m2  SpO2 98%     Review of Systems  Constitutional: Negative.   HENT: Negative for congestion, dental problem, hearing loss, rhinorrhea, sinus pressure, sore throat and tinnitus.   Eyes: Negative for pain, discharge and visual disturbance.  Respiratory: Positive for cough. Negative for shortness of breath.   Cardiovascular: Negative for chest pain, palpitations and leg swelling.  Gastrointestinal: Negative for nausea, vomiting, abdominal pain, diarrhea, constipation, blood in stool and abdominal distention.  Genitourinary: Negative for dysuria, urgency, frequency, hematuria, flank pain, vaginal bleeding, vaginal discharge, difficulty urinating, vaginal pain and pelvic  pain.  Musculoskeletal: Positive for back pain and arthralgias. Negative for joint swelling and gait problem.  Skin: Negative for rash.  Neurological: Negative for dizziness, syncope, speech difficulty, weakness, numbness and headaches.  Hematological: Negative for adenopathy.  Psychiatric/Behavioral: Negative for behavioral problems, dysphoric mood and agitation. The patient is not nervous/anxious.        Objective:   Physical Exam  Constitutional: She is oriented to person, place, and time. She appears well-developed and well-nourished.  HENT:  Head: Normocephalic.  Right Ear: External ear normal.  Left Ear:  External ear normal.  Mouth/Throat: Oropharynx is clear and moist.  Eyes: Conjunctivae and EOM are normal. Pupils are equal, round, and reactive to light.  Neck: Normal range of motion. Neck supple. No thyromegaly present.  Cardiovascular: Normal rate, regular rhythm, normal heart sounds and intact distal pulses.   Pulmonary/Chest: Effort normal and breath sounds normal.  Abdominal: Soft. Bowel sounds are normal. She exhibits no mass. There is no tenderness.  Musculoskeletal: Normal range of motion.  Mild osteoarthritic changes involving the small joints of the hands  Lymphadenopathy:    She has no cervical adenopathy.  Neurological: She is alert and oriented to person, place, and time.  Skin: Skin is warm and dry. No rash noted.  Psychiatric: She has a normal mood and affect. Her behavior is normal.          Assessment & Plan:   Hypertension.  Well-controlled at present.  Probable Ace inhibitor induced cough.  Will discontinue lisinopril and observe on metoprolol alone.  Home blood pressure monitoring recommended History of chest pain.  Status post cardiac cath with normal coronary arteries Diet-controlled diabetes Osteoarthritis.  Will treat with Tylenol.  Add Aleve if needed  Recheck 6 weeks

## 2014-12-16 NOTE — Patient Instructions (Signed)
Discontinue lisinopril  Limit your sodium (Salt) intake  Please check your blood pressure on a regular basis.  If it is consistently greater than 150/90, please make an office appointment.  Take [phone]  mg of Tylenol every 8 hours as needed for pain relief.  Avoid taking more than 3000 mg in a 24-hour period ( this may cause liver damage).  Take Aleve 200 mg twice daily for pain.

## 2014-12-16 NOTE — Progress Notes (Signed)
Pre visit review using our clinic review tool, if applicable. No additional management support is needed unless otherwise documented below in the visit note. 

## 2014-12-27 ENCOUNTER — Ambulatory Visit: Payer: Medicare Other | Admitting: Internal Medicine

## 2015-01-30 ENCOUNTER — Encounter: Payer: Self-pay | Admitting: Internal Medicine

## 2015-01-30 ENCOUNTER — Ambulatory Visit (INDEPENDENT_AMBULATORY_CARE_PROVIDER_SITE_OTHER): Payer: Medicare Other | Admitting: Internal Medicine

## 2015-01-30 VITALS — BP 144/80 | HR 63 | Temp 98.5°F | Resp 18 | Ht 61.0 in | Wt 130.0 lb

## 2015-01-30 DIAGNOSIS — R030 Elevated blood-pressure reading, without diagnosis of hypertension: Secondary | ICD-10-CM | POA: Diagnosis not present

## 2015-01-30 DIAGNOSIS — IMO0001 Reserved for inherently not codable concepts without codable children: Secondary | ICD-10-CM

## 2015-01-30 DIAGNOSIS — M549 Dorsalgia, unspecified: Secondary | ICD-10-CM

## 2015-01-30 DIAGNOSIS — E119 Type 2 diabetes mellitus without complications: Secondary | ICD-10-CM

## 2015-01-30 DIAGNOSIS — G8929 Other chronic pain: Secondary | ICD-10-CM

## 2015-01-30 DIAGNOSIS — Z23 Encounter for immunization: Secondary | ICD-10-CM | POA: Diagnosis not present

## 2015-01-30 MED ORDER — AMLODIPINE BESYLATE 2.5 MG PO TABS: 2.5000 mg | ORAL_TABLET | Freq: Every day | ORAL | Status: DC

## 2015-01-30 NOTE — Progress Notes (Signed)
Pre visit review using our clinic review tool, if applicable. No additional management support is needed unless otherwise documented below in the visit note. 

## 2015-01-30 NOTE — Patient Instructions (Signed)
Limit your sodium (Salt) intake  Please check your blood pressure on a regular basis.  If it is consistently greater than 150/90, please make an office appointment.  Return in 6 months for follow-up   

## 2015-01-30 NOTE — Progress Notes (Signed)
Subjective:    Patient ID: Julie Schmidt, female    DOB: 1940-11-10, 74 y.o.   MRN: [id number]  HPI 74 year old patient who is seen today for follow-up.  She self discontinued metoprolol thinking this was a cause of her cough.  The cough has resolved.  Lisinopril was discontinued at her last visit.  She has discontinued all medications.  She is doing well today except for pain over the coccyx  Past Medical History  Diagnosis Date  . Diabetes mellitus   . Headache   . Arthritis     knees, back    Social History   Social History  . Marital Status: Widowed    Spouse Name: N/A  . Number of Children: N/A  . Years of Education: N/A   Occupational History  . Not on file.   Social History Main Topics  . Smoking status: Never Smoker   . Smokeless tobacco: Never Used  . Alcohol Use: No  . Drug Use: No  . Sexual Activity: Not on file   Other Topics Concern  . Not on file   Social History Narrative    Past Surgical History  Procedure Laterality Date  . Cesarean section    . Appendectomy    . Abdominal hysterectomy    . Craniotomy Left 05/19/2014    Procedure: CRANIOTOMY TUMOR EXCISION with Curve;  Surgeon: Erline Levine, MD;  Location: [facility];  Service: Neurosurgery;  Laterality: Left;  Left frontal craniotomy for meningioma with brain lab  . Brain surgery  05/19/2014    Craniotomy for meningioma  . Cardiac catheterization N/A 11/25/2014    Procedure: Left Heart Cath and Coronary Angiography;  Surgeon: Jerline Pain, MD;  Location: [facility];  Service: Cardiovascular;  Laterality: N/A;    Family History  Problem Relation Age of Onset  . Cancer Father     Allergies  Allergen Reactions  . Oxycodone-Acetaminophen Nausea And Vomiting    Current Outpatient Prescriptions on File Prior to Visit  Medication Sig Dispense Refill  . aspirin 81 MG tablet Take 81 mg by mouth daily.    Marland Kitchen atorvastatin (LIPITOR) 20 MG tablet Take 1 tablet (20 mg total) by mouth  daily. (Patient not taking: Reported on 01/30/2015) 90 tablet 3  . metoprolol succinate (TOPROL-XL) 25 MG 24 hr tablet Take 1 tablet (25 mg total) by mouth daily. (Patient not taking: Reported on 01/30/2015) 30 tablet 6   No current facility-administered medications on file prior to visit.    BP 144/80 mmHg  Pulse 63  Temp(Src) 98.5 F (36.9 C) (Oral)  Resp 18  Ht 5\' 1"  (1.549 m)  Wt 130 lb (58.968 kg)  BMI 24.58 kg/m2  SpO2 98%      Review of Systems  Constitutional: Negative.   HENT: Negative for congestion, dental problem, hearing loss, rhinorrhea, sinus pressure, sore throat and tinnitus.   Eyes: Negative for pain, discharge and visual disturbance.  Respiratory: Negative for cough and shortness of breath.   Cardiovascular: Negative for chest pain, palpitations and leg swelling.  Gastrointestinal: Negative for nausea, vomiting, abdominal pain, diarrhea, constipation, blood in stool and abdominal distention.  Genitourinary: Negative for dysuria, urgency, frequency, hematuria, flank pain, vaginal bleeding, vaginal discharge, difficulty urinating, vaginal pain and pelvic pain.  Musculoskeletal: Negative for joint swelling, arthralgias and gait problem.       Coccydynia  Skin: Negative for rash.  Neurological: Negative for dizziness, syncope, speech difficulty, weakness, numbness and headaches.  Hematological: Negative for  adenopathy.  Psychiatric/Behavioral: Negative for behavioral problems, dysphoric mood and agitation. The patient is not nervous/anxious.        Objective:   Physical Exam  Constitutional: She is oriented to person, place, and time. She appears well-developed and well-nourished.  Blood pressure 160/80 both arms  HENT:  Head: Normocephalic.  Right Ear: External ear normal.  Left Ear: External ear normal.  Mouth/Throat: Oropharynx is clear and moist.  Eyes: Conjunctivae and EOM are normal. Pupils are equal, round, and reactive to light.  Neck: Normal range  of motion. Neck supple. No thyromegaly present.  Cardiovascular: Normal rate, regular rhythm, normal heart sounds and intact distal pulses.   Pulmonary/Chest: Effort normal and breath sounds normal.  Abdominal: Soft. Bowel sounds are normal. She exhibits no mass. There is no tenderness.  Musculoskeletal: Normal range of motion.  Palpation over the coccyx did reproduce her discomfort  Lymphadenopathy:    She has no cervical adenopathy.  Neurological: She is alert and oriented to person, place, and time.  Skin: Skin is warm and dry. No rash noted.  Psychiatric: She has a normal mood and affect. Her behavior is normal.          Assessment & Plan:    Hypertension.  Compliance of medications.  Encouraged.  Will start amlodipine 2.5.  Low-salt diet recommended.  Home blood pressure monitoring recommended.  Return in 6 months Coccydynia.  Discussed at length.  We'll avoid the pressure Diet-controlled diabetes, with normal hemoglobin A1c.  We'll recheck in 6 months

## 2015-01-30 NOTE — Addendum Note (Signed)
Addended by: Marian Sorrow on: 01/30/2015 03:09 PM   Modules accepted: Orders

## 2015-02-26 DIAGNOSIS — Z23 Encounter for immunization: Secondary | ICD-10-CM | POA: Diagnosis not present

## 2015-08-03 ENCOUNTER — Ambulatory Visit: Payer: Medicare Other | Admitting: Internal Medicine

## 2016-03-09 ENCOUNTER — Other Ambulatory Visit: Payer: Self-pay | Admitting: Neurosurgery

## 2016-03-09 DIAGNOSIS — D329 Benign neoplasm of meninges, unspecified: Secondary | ICD-10-CM

## 2016-03-12 ENCOUNTER — Other Ambulatory Visit: Payer: Self-pay | Admitting: Internal Medicine

## 2016-03-12 DIAGNOSIS — Z1231 Encounter for screening mammogram for malignant neoplasm of breast: Secondary | ICD-10-CM

## 2016-03-19 ENCOUNTER — Ambulatory Visit
Admission: RE | Admit: 2016-03-19 | Discharge: 2016-03-19 | Disposition: A | Discharge status: Home / Self Care | Payer: Medicare Other | Source: Ambulatory Visit | Attending: Neurosurgery | Admitting: Neurosurgery

## 2016-03-19 DIAGNOSIS — D329 Benign neoplasm of meninges, unspecified: Secondary | ICD-10-CM | POA: Diagnosis not present

## 2016-03-19 MED ORDER — GADOBENATE DIMEGLUMINE 529 MG/ML IV SOLN
12.0000 mL | Freq: Once | INTRAVENOUS | Status: AC | PRN
  Administered 2016-03-19: 12 mL via INTRAVENOUS

## 2016-03-22 ENCOUNTER — Ambulatory Visit
Admission: RE | Admit: 2016-03-22 | Discharge: 2016-03-22 | Disposition: A | Discharge status: Home / Self Care | Payer: Medicare Other | Source: Ambulatory Visit | Attending: Internal Medicine | Admitting: Internal Medicine

## 2016-03-22 DIAGNOSIS — Z1231 Encounter for screening mammogram for malignant neoplasm of breast: Secondary | ICD-10-CM

## 2016-04-22 DIAGNOSIS — Z681 Body mass index (BMI) 19 or less, adult: Secondary | ICD-10-CM | POA: Diagnosis not present

## 2016-04-22 DIAGNOSIS — I10 Essential (primary) hypertension: Secondary | ICD-10-CM | POA: Diagnosis not present

## 2016-04-22 DIAGNOSIS — E119 Type 2 diabetes mellitus without complications: Secondary | ICD-10-CM | POA: Diagnosis not present

## 2016-04-22 DIAGNOSIS — D329 Benign neoplasm of meninges, unspecified: Secondary | ICD-10-CM | POA: Diagnosis not present

## 2016-04-23 ENCOUNTER — Ambulatory Visit (INDEPENDENT_AMBULATORY_CARE_PROVIDER_SITE_OTHER): Payer: Medicare Other | Admitting: Family Medicine

## 2016-04-23 ENCOUNTER — Ambulatory Visit: Payer: Medicare Other | Admitting: Internal Medicine

## 2016-04-23 VITALS — BP 148/80 | HR 77 | Temp 98.3°F | Ht 61.0 in | Wt 125.4 lb

## 2016-04-23 DIAGNOSIS — I10 Essential (primary) hypertension: Secondary | ICD-10-CM | POA: Diagnosis not present

## 2016-04-23 DIAGNOSIS — K5909 Other constipation: Secondary | ICD-10-CM | POA: Diagnosis not present

## 2016-04-23 DIAGNOSIS — Z23 Encounter for immunization: Secondary | ICD-10-CM | POA: Diagnosis not present

## 2016-04-23 MED ORDER — AMLODIPINE BESYLATE 2.5 MG PO TABS: 2.5000 mg | ORAL_TABLET | Freq: Every day | ORAL | 1 refills | Status: DC

## 2016-04-23 NOTE — Patient Instructions (Signed)
BEFORE YOU LEAVE: -follow up: with your doctor in 2-3 weeks  Start the norvasc (amlodipine) 2.5 mg and take every day in the morning  Fiber supplement every morning such as metameucil or citracel. [place] mirilax 1-2 times daily whenever you start to get constipated.

## 2016-04-23 NOTE — Progress Notes (Signed)
HPI:  Acute visit for:  Hypertension: -hx of hypertension and per PCP notes was on lisinopril and metoprolol in the past, but pt thought they cause a cough and discontinued both -norvasc is on her medication list - but she didn't know she was supposed to be taking -was seeing a specialist yesterday and reports they told her that the BP was elevated and she needed to see her pcp -has occ headaches, felt dizzy briefly yesterday -no CP, SOB, DOE, vision changes, weakness, numbness -Per last PCP notes she was to monitor her blood pressure and follow up with him if it was elevated at home - she did not monitor at home  As she is leaving she mentions chronic constipation once by recommendations for this. This is not a new problem, ongoing for many years. Bowel movement several times a week, butstools can be hard and feels she is backed up.   ROS: See pertinent positives and negatives per HPI.  Past Medical History:  Diagnosis Date  . Arthritis    knees, back  . Diabetes mellitus   . Headache     Past Surgical History:  Procedure Laterality Date  . ABDOMINAL HYSTERECTOMY    . APPENDECTOMY    . BRAIN SURGERY  05/19/2014   Craniotomy for meningioma  . CARDIAC CATHETERIZATION N/A 11/25/2014   Procedure: Left Heart Cath and Coronary Angiography;  Surgeon: Jerline Pain, MD;  Location: [facility];  Service: Cardiovascular;  Laterality: N/A;  . CESAREAN SECTION    . CRANIOTOMY Left 05/19/2014   Procedure: CRANIOTOMY TUMOR EXCISION with Curve;  Surgeon: Erline Levine, MD;  Location: [facility];  Service: Neurosurgery;  Laterality: Left;  Left frontal craniotomy for meningioma with brain lab    Family History  Problem Relation Age of Onset  . Cancer Father     Social History   Social History  . Marital status: Widowed    Spouse name: N/A  . Number of children: N/A  . Years of education: N/A   Social History Main Topics  . Smoking status: Never Smoker  . Smokeless  tobacco: Never Used  . Alcohol use No  . Drug use: No  . Sexual activity: Not on file   Other Topics Concern  . Not on file   Social History Narrative  . No narrative on file     Current Outpatient Prescriptions:  .  amLODipine (NORVASC) 2.5 MG tablet, Take 1 tablet (2.5 mg total) by mouth daily., Disp: 30 tablet, Rfl: 1 .  atorvastatin (LIPITOR) 20 MG tablet, Take 1 tablet (20 mg total) by mouth daily. (Patient not taking: Reported on 04/23/2016), Disp: 90 tablet, Rfl: 3  EXAM:  Vitals:   04/23/16 1449  BP: (!) 148/80  Pulse: 77  Temp: 98.3 F (36.8 C)    Body mass index is 23.69 kg/m.  GENERAL: vitals reviewed and listed above, alert, oriented, appears well hydrated and in no acute distress  HEENT: atraumatic, conjunttiva clear, PERRLA,  no obvious abnormalities on inspection of external nose and ears  NECK: no obvious masses on inspection  LUNGS: clear to auscultation bilaterally, no wheezes, rales or rhonchi, good air movement  CV: HRRR, no peripheral edema  MS: moves all extremities without noticeable abnormality  PSYCH: pleasant and cooperative, no obvious depression or anxiety, cranial nerves II through XII grossly intact, speech and processing are grossly intact, gait normal  ASSESSMENT AND PLAN:  Discussed the following assessment and plan:  Essential hypertension  Chronic constipation  Encounter for immunization - Plan: Flu vaccine HIGH DOSE PF  -advised her to restart Norvasc for the blood pressure and follow up with her doctor in 2 weeks -Advised daily fiber supplement and as needed MiraLAX for the constipation with follow-up with her PCP to ensure resolving -Patient advised to return or notify a doctor immediately if symptoms worsen or persist or new concerns arise.  Patient Instructions  BEFORE YOU LEAVE: -follow up: with your doctor in 2-3 weeks  Start the norvasc (amlodipine) 2.5 mg and take every day in the morning  Fiber supplement  every morning such as metameucil or citracel. [place] mirilax 1-2 times daily whenever you start to get constipated.     Colin Benton R., DO

## 2016-04-23 NOTE — Progress Notes (Signed)
Pre visit review using our clinic review tool, if applicable. No additional management support is needed unless otherwise documented below in the visit note. 

## 2016-05-07 ENCOUNTER — Encounter: Payer: Self-pay | Admitting: Internal Medicine

## 2016-05-07 ENCOUNTER — Ambulatory Visit (INDEPENDENT_AMBULATORY_CARE_PROVIDER_SITE_OTHER): Payer: Medicare Other | Admitting: Internal Medicine

## 2016-05-07 VITALS — BP 122/76 | HR 57 | Temp 98.4°F | Resp 18 | Ht 61.0 in | Wt 125.4 lb

## 2016-05-07 DIAGNOSIS — K2970 Gastritis, unspecified, without bleeding: Secondary | ICD-10-CM

## 2016-05-07 DIAGNOSIS — B9681 Helicobacter pylori [H. pylori] as the cause of diseases classified elsewhere: Secondary | ICD-10-CM

## 2016-05-07 DIAGNOSIS — I10 Essential (primary) hypertension: Secondary | ICD-10-CM | POA: Diagnosis not present

## 2016-05-07 DIAGNOSIS — E119 Type 2 diabetes mellitus without complications: Secondary | ICD-10-CM | POA: Diagnosis not present

## 2016-05-07 NOTE — Progress Notes (Signed)
Subjective:    Patient ID: Julie Schmidt, female    DOB: [birth year], 75 y.o.   MRN: [id number]  HPI  75 year old patient who has a history of essential hypertension.  She was evaluated for chest pain and underwent heart catheterization last year.  At that time she was placed on atorvastatin, but apparently this prescription has never been taken.  She was treated for essential hypertension, initially with lisinopril and metoprolol.  The patient developed a cough and lisinopril was discontinued.  The patient subsequently discontinue metoprolol and amlodipine 2.5 milligrams daily substituted.  This she stopped 3 days ago due to dizziness and chest pain that occurs at night.  The dizziness does not appear to be orthostatic and occurs only when she bends over.  Denies any exertional chest pain.  She has had a heart catheterization with normal coronary arteries ; chest pain occurs only at night and is described as sharp  Past Medical History:  Diagnosis Date  . Arthritis    knees, back  . Diabetes mellitus   . Headache      Social History   Social History  . Marital status: Widowed    Spouse name: N/A  . Number of children: N/A  . Years of education: N/A   Occupational History  . Not on file.   Social History Main Topics  . Smoking status: Never Smoker  . Smokeless tobacco: Never Used  . Alcohol use No  . Drug use: No  . Sexual activity: Not on file   Other Topics Concern  . Not on file   Social History Narrative  . No narrative on file    Past Surgical History:  Procedure Laterality Date  . ABDOMINAL HYSTERECTOMY    . APPENDECTOMY    . BRAIN SURGERY  05/19/2014   Craniotomy for meningioma  . CARDIAC CATHETERIZATION N/A 11/25/2014   Procedure: Left Heart Cath and Coronary Angiography;  Surgeon: Jerline Pain, MD;  Location: [facility];  Service: Cardiovascular;  Laterality: N/A;  . CESAREAN SECTION    . CRANIOTOMY Left 05/19/2014   Procedure: CRANIOTOMY TUMOR  EXCISION with Curve;  Surgeon: Erline Levine, MD;  Location: [facility];  Service: Neurosurgery;  Laterality: Left;  Left frontal craniotomy for meningioma with brain lab    Family History  Problem Relation Age of Onset  . Cancer Father     Allergies  Allergen Reactions  . Norvasc [Amlodipine] Other (See Comments)    Dizziness and Chest pains at night when lies down  . Oxycodone-Acetaminophen Nausea And Vomiting    No current outpatient prescriptions on file prior to visit.   No current facility-administered medications on file prior to visit.     BP 122/76 (BP Location: Left Arm, Patient Position: Sitting, Cuff Size: Normal)   Pulse (!) 57   Temp 98.4 F (36.9 C) (Oral)   Resp 18   Ht 5\' 1"  (1.549 m)   Wt 125 lb 6.1 oz (56.9 kg)   SpO2 98%   BMI 23.69 kg/m     Review of Systems  Constitutional: Negative.   HENT: Negative for congestion, dental problem, hearing loss, rhinorrhea, sinus pressure, sore throat and tinnitus.   Eyes: Negative for pain, discharge and visual disturbance.  Respiratory: Negative for cough and shortness of breath.   Cardiovascular: Positive for chest pain. Negative for palpitations and leg swelling.  Gastrointestinal: Negative for abdominal distention, abdominal pain, blood in stool, constipation, diarrhea, nausea and vomiting.  Genitourinary: Negative for  difficulty urinating, dysuria, flank pain, frequency, hematuria, pelvic pain, urgency, vaginal bleeding, vaginal discharge and vaginal pain.  Musculoskeletal: Negative for arthralgias, gait problem and joint swelling.  Skin: Negative for rash.  Neurological: Positive for dizziness and light-headedness. Negative for syncope, speech difficulty, weakness, numbness and headaches.  Hematological: Negative for adenopathy.  Psychiatric/Behavioral: Negative for agitation, behavioral problems and dysphoric mood. The patient is not nervous/anxious.        Objective:   Physical Exam  Constitutional:  She is oriented to person, place, and time. She appears well-developed and well-nourished.  Blood pressure 122/76 on arrival Repeat blood pressure 130/80  HENT:  Head: Normocephalic.  Right Ear: External ear normal.  Left Ear: External ear normal.  Mouth/Throat: Oropharynx is clear and moist.  Eyes: Conjunctivae and EOM are normal. Pupils are equal, round, and reactive to light.  Neck: Normal range of motion. Neck supple. No thyromegaly present.  Cardiovascular: Normal rate, regular rhythm, normal heart sounds and intact distal pulses.   Pulmonary/Chest: Effort normal and breath sounds normal.  Abdominal: Soft. Bowel sounds are normal. She exhibits no mass. There is no tenderness.  Musculoskeletal: Normal range of motion.  Lymphadenopathy:    She has no cervical adenopathy.  Neurological: She is alert and oriented to person, place, and time.  Skin: Skin is warm and dry. No rash noted.  Psychiatric: She has a normal mood and affect. Her behavior is normal.          Assessment & Plan:   History of hypertension.  Patient has been off amlodipine for 3 days, and blood pressure is normal today.  Patient is accompanied by her daughter.  Is recommended that she obtain a home blood pressure monitor and to track blood pressures as an outpatient.  We will hold medication unless blood pressure readings are consistently greater than 150 over 90.  Low-salt diet recommended Follow-up 4 months or as needed  Nyoka Cowden

## 2016-05-07 NOTE — Progress Notes (Signed)
Pre visit review using our clinic review tool, if applicable. No additional management support is needed unless otherwise documented below in the visit note. 

## 2016-05-07 NOTE — Patient Instructions (Signed)
Limit your sodium (Salt) intake    It is important that you exercise regularly, at least 20 minutes 3 to 4 times per week.  If you develop chest pain or shortness of breath seek  medical attention.  Please check your blood pressure on a regular basis.  If it is consistently greater than 150/90, please make an office appointment.  Take a calcium supplement, plus [phone] units of vitamin D  Return in 4 months for follow-up

## 2016-05-08 LAB — CBC WITH DIFFERENTIAL/PLATELET
BASOS ABS: 0 10*3/uL (ref 0.0–0.1)
Basophils Relative: 0.8 % (ref 0.0–3.0)
Eosinophils Absolute: 0.1 10*3/uL (ref 0.0–0.7)
Eosinophils Relative: 1.8 % (ref 0.0–5.0)
HEMATOCRIT: 36.1 % (ref 36.0–46.0)
Hemoglobin: 12.2 g/dL (ref 12.0–15.0)
LYMPHS PCT: 33.6 % (ref 12.0–46.0)
Lymphs Abs: 2 10*3/uL (ref 0.7–4.0)
MCHC: 33.7 g/dL (ref 30.0–36.0)
MCV: 90.4 fl (ref 78.0–100.0)
Monocytes Absolute: 0.5 10*3/uL (ref 0.1–1.0)
Monocytes Relative: 7.7 % (ref 3.0–12.0)
NEUTROS ABS: 3.3 10*3/uL (ref 1.4–7.7)
Neutrophils Relative %: 56.1 % (ref 43.0–77.0)
Platelets: 287 10*3/uL (ref 150.0–400.0)
RBC: 3.99 Mil/uL (ref 3.87–5.11)
RDW: 13.6 % (ref 11.5–15.5)
WBC: 5.9 10*3/uL (ref 4.0–10.5)

## 2016-05-08 LAB — TSH: TSH: 1.47 u[IU]/mL (ref 0.35–4.50)

## 2016-05-08 LAB — COMPREHENSIVE METABOLIC PANEL
ALK PHOS: 106 U/L (ref 39–117)
ALT: 14 U/L (ref 0–35)
AST: 18 U/L (ref 0–37)
Albumin: 4.7 g/dL (ref 3.5–5.2)
BUN: 16 mg/dL (ref 6–23)
CO2: 30 mEq/L (ref 19–32)
Calcium: 9.7 mg/dL (ref 8.4–10.5)
Chloride: 103 mEq/L (ref 96–112)
Creatinine, Ser: 0.9 mg/dL (ref 0.40–1.20)
GFR: 78.89 mL/min (ref >60.00)
Glucose, Bld: 99 mg/dL (ref 70–99)
POTASSIUM: 3.8 meq/L (ref 3.5–5.1)
Sodium: 142 mEq/L (ref 135–145)
TOTAL PROTEIN: 7.8 g/dL (ref 6.0–8.3)
Total Bilirubin: 0.3 mg/dL (ref 0.2–1.2)

## 2016-05-08 LAB — HEMOGLOBIN A1C: Hgb A1c MFr Bld: 6.2 % (ref 4.6–6.5)

## 2016-05-14 ENCOUNTER — Other Ambulatory Visit: Payer: Self-pay | Admitting: Family Medicine

## 2016-06-07 ENCOUNTER — Ambulatory Visit (INDEPENDENT_AMBULATORY_CARE_PROVIDER_SITE_OTHER): Payer: Medicare Other

## 2016-06-07 VITALS — BP 150/80 | HR 95 | Ht 61.0 in | Wt 129.0 lb

## 2016-06-07 DIAGNOSIS — Z0000 Encounter for general adult medical examination without abnormal findings: Secondary | ICD-10-CM

## 2016-06-07 DIAGNOSIS — E119 Type 2 diabetes mellitus without complications: Secondary | ICD-10-CM | POA: Diagnosis not present

## 2016-06-07 DIAGNOSIS — Z23 Encounter for immunization: Secondary | ICD-10-CM | POA: Diagnosis not present

## 2016-06-07 NOTE — Progress Notes (Addendum)
Subjective:   Julie Schmidt is a 75 y.o. female who presents for Medicare Annual (Subsequent) preventive examination.  The Patient was informed that the wellness visit is to identify future health risk and educate and initiate measures that can reduce risk for increased disease through the lifespan.    NO ROS; Medicare Wellness Visit Julie Schmidt presents today with her Dtr. She is originally from [place] and speaks [place]. English is her 2nd language   Describes health as good, fair or great? Fair Has a pain in her neck; states she has had an MRI and nothing was found Takes Advil on occasion for her neck with some relief  Hx of craniotomy; for tumor excision 05/2014   Preventive Screening -Counseling & Management  Ms Schmidt just returned from a year in [place] this past Sept. She had 8 overdue metrics and spent most of our time discussing the memory issues her dtr has noticed since her return, as well as other psychosocial issues. Did educated on all metrics for the plan  See notes below for plan  Mammogram; 03/2016 PAP 12/2013 Colonoscopy /has never had one;   No meds x Aleve  Per cpe in Nov; off amlodipine from 05/04/16 BP was elevated today. Was noted to have slightly irreg HR but states she feels fine; no sob or other. Rate was 95; but dropped to 70 range; Agreed to fup with Dr. Raliegh Ip for apt for BP and other.   Smoking history /never smoked  Second Hand Smoke status; No Smokers in the home  EtOH no   RISK FACTORS Regular exercise  Cleans home, cooks   Diet Breakfast; bread strawberries and butter w chocolate milk Lunch; rice Supper; left overs;  likes vegetables; fruit   Fall risk  nofalls Mobility of Functional changes this year? no Safety; lives with dtr at "friends"  Home This was suppose to be temporary. The dtr admits she needs assistance in finding a home. She is disabled as well with limited resources   Cardiac Risk Factors:  Advanced aged: >35 in  women Hyperlipidemia- chol 205; trig 123; hdl 47 and LDL 133  Diabetes/ BS 99; A1c 6.2 /she does have diet controlled dm Family History ( no cardiac hx)  Obesity- neg  Eye exam needs eye exam Pre diabetic and asked to be sure she was fasting at her next blood draw   Vision Would like to have eye referral;  The patient states she can't see well enough to knit and sew which she enjoys. Discussed the need to fup with eye doctor locally. Dtr has an eye doctor but is considering finding another herself.    Depression Screen Dtr states Ms Schmidt is "very sad" and voices her depression. Ms. Gracie smiles and states she is fine.  The patient has dropped approx 5 lbs since last year but weight is normal.  PhQ 2: considered negative at this time   Activities of Daily Living - See functional screen   Cognitive testing; 25 minutes spent on memory loss, education and resources  The dtr informed to make a list; states the patient is not doing their usual "prayers" together as normal; she is laughing and not following through; will confabulate stories to support her opinion. All of this is confusing to the dtr. Spent time discussing memory and management; Given Alz resources, explaining memory issues are complex and occur for a variety of reasons. She agrees to bring Julie Schmidt back in to see Dr. Raliegh Ip to check blood  work, r/o other causes and to refer as appropriate   MMSE 26/30;missed 3 recall and season; later could have been due to language barrier. Missed 3/5 serial 7's until the dtr interpreted in french and then she was correct  The patient states her english is "not good" may be reverting back to her [place] since spending a year with them.  Copy scanned to the chart  Advanced Directives / no given a copy to the dtr and resources to assist her to complete  Julie Schmidt agreed for fup   Patient Care Team: Marletta Lor, MD as PCP - General (Internal Medicine)  Dr. Vertell Limber neurosurgery    Immunization History  Administered Date(s) Administered  . Influenza, High Dose Seasonal PF 04/23/2016  . Influenza-Unspecified 03/29/2014  . Pneumococcal Conjugate-13 01/30/2015  . Pneumococcal Polysaccharide-23 06/07/2016   Required Immunizations needed today  Screening test up to date or reviewed for plan of completion Health Maintenance Due  Topic Date Due  . OPHTHALMOLOGY EXAM  03/03/1951  . URINE MICROALBUMIN  03/03/1951  . TETANUS/TDAP  03/03/1962  . COLONOSCOPY  03/03/1993  . ZOSTAVAX  03/04/2003  . DEXA SCAN  03/03/2008  . FOOT EXAM  01/30/2016  . PNA vac Low Risk Adult (2 of 2 - PPSV23) 01/30/2016   Plan or education presented today  1. Will refer for eye exam. the patient states she has cataracts and vision issues are worse. Is wearing darkened glasses today because of her being broken  2. Will defer micro albumin as there is no dx of DM at this time.  3. Educated regarding tetanus; declines today Will consider if she has accidental cut etc  4 The dtr states she has never had a colonoscopy. Given information regarding the cologuard and did agree to complete/ fax form completed and to [facility] for Dr. Raliegh Ip  5. Declines shingles due to cost and other  6. Deferred dexa; may do in the future  7. Completed foot exam today  8. Did agree to take the PSV 23 today         Objective:     Vitals: BP (!) 150/80   Pulse 95   Ht 5\' 1"  (1.549 m)   Wt 129 lb (58.5 kg)   SpO2 98%   BMI 24.37 kg/m   Body mass index is 24.37 kg/m.   Tobacco History  Smoking Status  . Never Smoker  Smokeless Tobacco  . Never Used     Counseling given: Yes   Past Medical History:  Diagnosis Date  . Arthritis    knees, back  . Diabetes mellitus   . Headache    Past Surgical History:  Procedure Laterality Date  . ABDOMINAL HYSTERECTOMY    . APPENDECTOMY    . BRAIN SURGERY  05/19/2014   Craniotomy for meningioma  . CARDIAC CATHETERIZATION N/A 11/25/2014   Procedure: Left  Heart Cath and Coronary Angiography;  Surgeon: Jerline Pain, MD;  Location: [facility];  Service: Cardiovascular;  Laterality: N/A;  . CESAREAN SECTION    . CRANIOTOMY Left 05/19/2014   Procedure: CRANIOTOMY TUMOR EXCISION with Curve;  Surgeon: Erline Levine, MD;  Location: [facility];  Service: Neurosurgery;  Laterality: Left;  Left frontal craniotomy for meningioma with brain lab   Family History  Problem Relation Age of Onset  . Cancer Father    History  Sexual Activity  . Sexual activity: Not on file    Outpatient Encounter Prescriptions as of 06/07/2016  Medication Sig  .  ibuprofen (ADVIL,MOTRIN) 200 MG tablet Take 200 mg by mouth every 6 (six) hours as needed.   No facility-administered encounter medications on file as of 06/07/2016.     Activities of Daily Living No flowsheet data found.  Patient Care Team: Marletta Lor, MD as PCP - General (Internal Medicine)    Assessment:    Exercise Activities and Dietary recommendations   To further evaluate needs  Goals    None     Fall Risk Fall Risk  06/28/2014  Falls in the past year? No   Depression Screen PHQ 2/9 Scores 06/28/2014  PHQ - 2 Score 0    See MMSE         Immunization History  Administered Date(s) Administered  . Influenza, High Dose Seasonal PF 04/23/2016  . Influenza-Unspecified 03/29/2014  . Pneumococcal Conjugate-13 01/30/2015  . Pneumococcal Polysaccharide-23 06/07/2016   Screening Tests Health Maintenance  Topic Date Due  . OPHTHALMOLOGY EXAM  03/03/1951  . URINE MICROALBUMIN  03/03/1951  . TETANUS/TDAP  03/03/1962  . COLONOSCOPY  03/03/1993  . ZOSTAVAX  03/04/2003  . DEXA SCAN  03/03/2008  . FOOT EXAM  01/30/2016  . PNA vac Low Risk Adult (2 of 2 - PPSV23) 01/30/2016  . HEMOGLOBIN A1C  11/04/2016  . MAMMOGRAM  03/22/2018  . INFLUENZA VACCINE  Completed      Plan:    PCP Notes  Health Maintenance All reviewed Will completed fax for cologuard Will defer  tetanus Foot exam completed Postpone shingles (may have medicare/ medicaid and coverage but could not review for benefit today)  Deferred dexa scan PSV 23 taken today   Abnormal Screens  See below referrals MMSE 26/30; language barrier complicating   Referrals;  1. Will attempt eye referral 2. Requesting foot referral for pain; no issues with feet noted today but will see Dr. Raliegh Ip to assess and evaluate for referral to podiatry 3. To fup with Dr. Raliegh Ip for memory issues and evaluation as indicated;   4. Dtr states she and mother are living w a friend. DSS will not assist them to find a home; the patient is at risk for failure in current living situation, particularly if issues continue\  5. Dtr agreed to referral to [facility]; to learn more about dementia in general and to assist with the day to day issues that are problematic for her.   Patient concerns; Ms. Castleman does not have any concerns  Nurse Concerns; Added resources for food; electric etc; will refer to [facility] if they can pick her up; Will refer to [facility] for SW to go out if available As dtr is primary caregiver and she has hx of stroke and back surgery   Next PCP apt 1/5/to check BP; Memory ? Labs or referral and assess right foot for referral to podiatry or other.     During the course of the visit the patient was educated and counseled about the following appropriate screening and preventive services:   Vaccines to include Pneumoccal, Influenza, Hepatitis B, Td, Zostavax, HCV  Electrocardiogram  Cardiovascular Disease  Colorectal cancer screening  Bone density screening  Diabetes screening  Glaucoma screening  Mammography/PAP  Nutrition counseling   Patient Instructions (the written plan) was given to the patient.   [id number], RN  06/07/2016   Results of annual wellness visit review and agree with findings.  Nyoka Cowden

## 2016-06-07 NOTE — Patient Instructions (Addendum)
Julie Schmidt , Thank you for taking time to come for your Medicare Wellness Visit. I appreciate your ongoing commitment to your health goals. Please review the following plan we discussed and let me know if I can assist you in the future.    To make fup apt with Dr. Raliegh Ip 1. Referral  to the foot doctor 2. Referral for vision check 3. fup on memory for plan   Diabetes and weight loss; [facility]  Phone: [phone]   [place] [facility]; [phone] Sr. [place]; [phone] Get resource to get information on any and all community programs for Seniors  [place]: [phone] Community Health Response Program -[phone] Public Health Dept; Need to be a skilled visit but can assist with bathing as well; [phone]  Adult center for Enrichment;  Call Senior Line; [phone]  Adult day services include Adult Day Care, Adult Day Healthcare, Group Respite, Care Partners, Volunteer In [employer], Education and Support Program     Dept of Social Services; Call [phone] and ask for SW on call  Options for Medicaid include the Community Alternatives program; [place] (personal care services) or PACE program, which is a medical and social program combined  Braulio Conte manages the community Alternatives program at the [employer]; [phone] (this is a program with a waiting list but provides SNF care at home;  [facility] 2 required; Call Claiborne Billings and she will send out packet of information   Caregiver support group and information regarding Industry is at the; [facility] Address: [address] [place] [address], [place] [address]  Phone: [phone]   [employer] general resources for food etc   Http://nihseniorhealth.giv  Deaf & Hard of Hearing Division Services - can assist with hearing aid x 1  No reviews  [facility]  [address]  [phone]   A Tetanus  is recommended every 10 years. Medicare covers a tetanus if you have a cut or wound; otherwise, there may be a charge. If you had not had a tetanus with pertusses, known as the Tdap, you can take this anytime.   Given information on the cologuard  Educated to check with insurance regarding coverage of Shingles vaccination on Part D or Part B and may have lower co-pay if provided on the Part D side   Recommendations for Dexa Scan Female over the age of 36 Man age 24 or older If you broke a bone past the age of 102 Women menopausal age with risk factors (thin frame; smoker; hx of fx ) Post menopausal women under the age of 28 with risk factors A man age 51 to 87 with risk factors Other: Spine xray that is showing break of bone loss Back pain with possible break Height loss of 1/2 inch or more within one year Total loss in height of 1.5 inches from your original height  Calcium 1254m with Vit D 800u per day; more as directed by physician Strength building exercises discussed; can include walking; housework; small weights or stretch bands; silver sneakers if access to the Y  Will take her PSV 23 today   These are the goals we discussed:/ get a sEducation officer, museum Goals    None      This is a list of the screening recommended for you and due dates:  Health Maintenance  Topic Date Due  . Eye exam for diabetics  03/03/1951  . Urine Protein Check  03/03/1951  .  Tetanus Vaccine  03/03/1962  . Colon Cancer Screening  03/03/1993  . Shingles Vaccine  03/04/2003  . DEXA scan (bone density measurement)  03/03/2008  . Complete foot exam   01/30/2016  . Pneumonia vaccines (2 of 2 - PPSV23) 01/30/2016  . Hemoglobin A1C  11/04/2016  . Mammogram  03/22/2018  . Flu Shot  Completed       Bone Densitometry Introduction Bone densitometry is an imaging test that uses a special X-ray to measure the amount of calcium and other minerals in your bones (bone density). This test is also known as a  bone mineral density test or dual-energy X-ray absorptiometry (DXA). The test can measure bone density at your hip and your spine. It is similar to having a regular X-ray. You may have this test to:  Diagnose a condition that causes weak or thin bones (osteoporosis).  Predict your risk of a broken bone (fracture).  Determine how well osteoporosis treatment is working. Tell a health care provider about:  Any allergies you have.  All medicines you are taking, including vitamins, herbs, eye drops, creams, and over-the-counter medicines.  Any problems you or family members have had with anesthetic medicines.  Any blood disorders you have.  Any surgeries you have had.  Any medical conditions you have.  Possibility of pregnancy.  Any other medical test you had within the previous 14 days that used contrast material. What are the risks? Generally, this is a safe procedure. However, problems can occur and may include the following:  This test exposes you to a very small amount of radiation.  The risks of radiation exposure may be greater to unborn children. What happens before the procedure?  Do not take any calcium supplements for 24 hours before having the test. You can otherwise eat and drink what you usually do.  Take off all metal jewelry, eyeglasses, dental appliances, and any other metal objects. What happens during the procedure?  You may lie on an exam table. There will be an X-ray generator below you and an imaging device above you.  Other devices, such as boxes or braces, may be used to position your body properly for the scan.  You will need to lie still while the machine slowly scans your body.  The images will show up on a computer monitor. What happens after the procedure? You may need more testing at a later time. This information is not intended to replace advice given to you by your health care provider. Make sure you discuss any questions you have with your  health care provider. Document Released: 06/18/2004 Document Revised: 11/02/2015 Document Reviewed: 11/04/2013  2017 Elsevier   Fall Prevention in the Home Introduction Falls can cause injuries. They can happen to people of all ages. There are many things you can do to make your home safe and to help prevent falls. What can I do on the outside of my home?  Regularly fix the edges of walkways and driveways and fix any cracks.  Remove anything that might make you trip as you walk through a door, such as a raised step or threshold.  Trim any bushes or trees on the path to your home.  Use bright outdoor lighting.  Clear any walking paths of anything that might make someone trip, such as rocks or tools.  Regularly check to see if handrails are loose or broken. Make sure that both sides of any steps have handrails.  Any raised decks and porches should have guardrails on  the edges.  Have any leaves, snow, or ice cleared regularly.  Use sand or salt on walking paths during winter.  Clean up any spills in your garage right away. This includes oil or grease spills. What can I do in the bathroom?  Use night lights.  Install grab bars by the toilet and in the tub and shower. Do not use towel bars as grab bars.  Use non-skid mats or decals in the tub or shower.  If you need to sit down in the shower, use a plastic, non-slip stool.  Keep the floor dry. Clean up any water that spills on the floor as soon as it happens.  Remove soap buildup in the tub or shower regularly.  Attach bath mats securely with double-sided non-slip rug tape.  Do not have throw rugs and other things on the floor that can make you trip. What can I do in the bedroom?  Use night lights.  Make sure that you have a light by your bed that is easy to reach.  Do not use any sheets or blankets that are too big for your bed. They should not hang down onto the floor.  Have a firm chair that has side arms. You can  use this for support while you get dressed.  Do not have throw rugs and other things on the floor that can make you trip. What can I do in the kitchen?  Clean up any spills right away.  Avoid walking on wet floors.  Keep items that you use a lot in easy-to-reach places.  If you need to reach something above you, use a strong step stool that has a grab bar.  Keep electrical cords out of the way.  Do not use floor polish or wax that makes floors slippery. If you must use wax, use non-skid floor wax.  Do not have throw rugs and other things on the floor that can make you trip. What can I do with my stairs?  Do not leave any items on the stairs.  Make sure that there are handrails on both sides of the stairs and use them. Fix handrails that are broken or loose. Make sure that handrails are as long as the stairways.  Check any carpeting to make sure that it is firmly attached to the stairs. Fix any carpet that is loose or worn.  Avoid having throw rugs at the top or bottom of the stairs. If you do have throw rugs, attach them to the floor with carpet tape.  Make sure that you have a light switch at the top of the stairs and the bottom of the stairs. If you do not have them, ask someone to add them for you. What else can I do to help prevent falls?  Wear shoes that:  Do not have high heels.  Have rubber bottoms.  Are comfortable and fit you well.  Are closed at the toe. Do not wear sandals.  If you use a stepladder:  Make sure that it is fully opened. Do not climb a closed stepladder.  Make sure that both sides of the stepladder are locked into place.  Ask someone to hold it for you, if possible.  Clearly mark and make sure that you can see:  Any grab bars or handrails.  First and last steps.  Where the edge of each step is.  Use tools that help you move around (mobility aids) if they are needed. These include:  Canes.  Walkers.  Scooters.  Crutches.  Turn  on the lights when you go into a dark area. Replace any light bulbs as soon as they burn out.  Set up your furniture so you have a clear path. Avoid moving your furniture around.  If any of your floors are uneven, fix them.  If there are any pets around you, be aware of where they are.  Review your medicines with your doctor. Some medicines can make you feel dizzy. This can increase your chance of falling. Ask your doctor what other things that you can do to help prevent falls. This information is not intended to replace advice given to you by your health care provider. Make sure you discuss any questions you have with your health care provider. Document Released: 03/23/2009 Document Revised: 11/02/2015 Document Reviewed: 07/01/2014  2017 Elsevier  Health Maintenance, Female Introduction Adopting a healthy lifestyle and getting preventive care can go a long way to promote health and wellness. Talk with your health care provider about what schedule of regular examinations is right for you. This is a good chance for you to check in with your provider about disease prevention and staying healthy. In between checkups, there are plenty of things you can do on your own. Experts have done a lot of research about which lifestyle changes and preventive measures are most likely to keep you healthy. Ask your health care provider for more information. Weight and diet Eat a healthy diet  Be sure to include plenty of vegetables, fruits, low-fat dairy products, and lean protein.  Do not eat a lot of foods high in solid fats, added sugars, or salt.  Get regular exercise. This is one of the most important things you can do for your health.  Most adults should exercise for at least 150 minutes each week. The exercise should increase your heart rate and make you sweat (moderate-intensity exercise).  Most adults should also do strengthening exercises at least twice a week. This is in addition to the  moderate-intensity exercise. Maintain a healthy weight  Body mass index (BMI) is a measurement that can be used to identify possible weight problems. It estimates body fat based on height and weight. Your health care provider can help determine your BMI and help you achieve or maintain a healthy weight.  For females 57 years of age and older:  A BMI below 18.5 is considered underweight.  A BMI of 18.5 to 24.9 is normal.  A BMI of 25 to 29.9 is considered overweight.  A BMI of 30 and above is considered obese. Watch levels of cholesterol and blood lipids  You should start having your blood tested for lipids and cholesterol at 73 years of age, then have this test every 5 years.  You may need to have your cholesterol levels checked more often if:  Your lipid or cholesterol levels are high.  You are older than 73 years of age.  You are at high risk for heart disease. Cancer screening Lung Cancer  Lung cancer screening is recommended for adults 37-40 years old who are at high risk for lung cancer because of a history of smoking.  A yearly low-dose CT scan of the lungs is recommended for people who:  Currently smoke.  Have quit within the past 15 years.  Have at least a 30-pack-year history of smoking. A pack year is smoking an average of one pack of cigarettes a day for 1 year.  Yearly screening should continue until it has been 15  years since you quit.  Yearly screening should stop if you develop a health problem that would prevent you from having lung cancer treatment. Breast Cancer  Practice breast self-awareness. This means understanding how your breasts normally appear and feel.  It also means doing regular breast self-exams. Let your health care provider know about any changes, no matter how small.  If you are in your 20s or 30s, you should have a clinical breast exam (CBE) by a health care provider every 1-3 years as part of a regular health exam.  If you are 56 or  older, have a CBE every year. Also consider having a breast X-ray (mammogram) every year.  If you have a family history of breast cancer, talk to your health care provider about genetic screening.  If you are at high risk for breast cancer, talk to your health care provider about having an MRI and a mammogram every year.  Breast cancer gene (BRCA) assessment is recommended for women who have family members with BRCA-related cancers. BRCA-related cancers include:  Breast.  Ovarian.  Tubal.  Peritoneal cancers.  Results of the assessment will determine the need for genetic counseling and BRCA1 and BRCA2 testing. Cervical Cancer  Your health care provider may recommend that you be screened regularly for cancer of the pelvic organs (ovaries, uterus, and vagina). This screening involves a pelvic examination, including checking for microscopic changes to the surface of your cervix (Pap test). You may be encouraged to have this screening done every 3 years, beginning at age 26.  For women ages 60-65, health care providers may recommend pelvic exams and Pap testing every 3 years, or they may recommend the Pap and pelvic exam, combined with testing for human papilloma virus (HPV), every 5 years. Some types of HPV increase your risk of cervical cancer. Testing for HPV may also be done on women of any age with unclear Pap test results.  Other health care providers may not recommend any screening for nonpregnant women who are considered low risk for pelvic cancer and who do not have symptoms. Ask your health care provider if a screening pelvic exam is right for you.  If you have had past treatment for cervical cancer or a condition that could lead to cancer, you need Pap tests and screening for cancer for at least 20 years after your treatment. If Pap tests have been discontinued, your risk factors (such as having a new sexual partner) need to be reassessed to determine if screening should resume. Some  women have medical problems that increase the chance of getting cervical cancer. In these cases, your health care provider may recommend more frequent screening and Pap tests. Colorectal Cancer  This type of cancer can be detected and often prevented.  Routine colorectal cancer screening usually begins at 73 years of age and continues through 73 years of age.  Your health care provider may recommend screening at an earlier age if you have risk factors for colon cancer.  Your health care provider may also recommend using home test kits to check for hidden blood in the stool.  A small camera at the end of a tube can be used to examine your colon directly (sigmoidoscopy or colonoscopy). This is done to check for the earliest forms of colorectal cancer.  Routine screening usually begins at age 69.  Direct examination of the colon should be repeated every 5-10 years through 73 years of age. However, you may need to be screened more often if early  forms of precancerous polyps or small growths are found. Skin Cancer  Check your skin from head to toe regularly.  Tell your health care provider about any new moles or changes in moles, especially if there is a change in a mole's shape or color.  Also tell your health care provider if you have a mole that is larger than the size of a pencil eraser.  Always use sunscreen. Apply sunscreen liberally and repeatedly throughout the day.  Protect yourself by wearing long sleeves, pants, a wide-brimmed hat, and sunglasses whenever you are outside. Heart disease, diabetes, and high blood pressure  High blood pressure causes heart disease and increases the risk of stroke. High blood pressure is more likely to develop in:  People who have blood pressure in the high end of the normal range (130-139/85-89 mm Hg).  People who are overweight or obese.  People who are African American.  If you are 76-97 years of age, have your blood pressure checked every  3-5 years. If you are 15 years of age or older, have your blood pressure checked every year. You should have your blood pressure measured twice-once when you are at a hospital or clinic, and once when you are not at a hospital or clinic. Record the average of the two measurements. To check your blood pressure when you are not at a hospital or clinic, you can use:  An automated blood pressure machine at a pharmacy.  A home blood pressure monitor.  If you are between 55 years and 5 years old, ask your health care provider if you should take aspirin to prevent strokes.  Have regular diabetes screenings. This involves taking a blood sample to check your fasting blood sugar level.  If you are at a normal weight and have a low risk for diabetes, have this test once every three years after 73 years of age.  If you are overweight and have a high risk for diabetes, consider being tested at a younger age or more often. Preventing infection Hepatitis B  If you have a higher risk for hepatitis B, you should be screened for this virus. You are considered at high risk for hepatitis B if:  You were born in a country where hepatitis B is common. Ask your health care provider which countries are considered high risk.  Your parents were born in a high-risk country, and you have not been immunized against hepatitis B (hepatitis B vaccine).  You have HIV or AIDS.  You use needles to inject street drugs.  You live with someone who has hepatitis B.  You have had sex with someone who has hepatitis B.  You get hemodialysis treatment.  You take certain medicines for conditions, including cancer, organ transplantation, and autoimmune conditions. Hepatitis C  Blood testing is recommended for:  Everyone born from [birth year] through 1963.  Anyone with known risk factors for hepatitis C. Sexually transmitted infections (STIs)  You should be screened for sexually transmitted infections (STIs) including  gonorrhea and chlamydia if:  You are sexually active and are younger than 73 years of age.  You are older than 73 years of age and your health care provider tells you that you are at risk for this type of infection.  Your sexual activity has changed since you were last screened and you are at an increased risk for chlamydia or gonorrhea. Ask your health care provider if you are at risk.  If you do not have HIV, but are at risk,  it may be recommended that you take a prescription medicine daily to prevent HIV infection. This is called pre-exposure prophylaxis (PrEP). You are considered at risk if:  You are sexually active and do not regularly use condoms or know the HIV status of your partner(s).  You take drugs by injection.  You are sexually active with a partner who has HIV. Talk with your health care provider about whether you are at high risk of being infected with HIV. If you choose to begin PrEP, you should first be tested for HIV. You should then be tested every 3 months for as long as you are taking PrEP. Pregnancy  If you are premenopausal and you may become pregnant, ask your health care provider about preconception counseling.  If you may become pregnant, take 400 to 800 micrograms (mcg) of folic acid every day.  If you want to prevent pregnancy, talk to your health care provider about birth control (contraception). Osteoporosis and menopause  Osteoporosis is a disease in which the bones lose minerals and strength with aging. This can result in serious bone fractures. Your risk for osteoporosis can be identified using a bone density scan.  If you are 26 years of age or older, or if you are at risk for osteoporosis and fractures, ask your health care provider if you should be screened.  Ask your health care provider whether you should take a calcium or vitamin D supplement to lower your risk for osteoporosis.  Menopause may have certain physical symptoms and risks.  Hormone  replacement therapy may reduce some of these symptoms and risks. Talk to your health care provider about whether hormone replacement therapy is right for you. Follow these instructions at home:  Schedule regular health, dental, and eye exams.  Stay current with your immunizations.  Do not use any tobacco products including cigarettes, chewing tobacco, or electronic cigarettes.  If you are pregnant, do not drink alcohol.  If you are breastfeeding, limit how much and how often you drink alcohol.  Limit alcohol intake to no more than 1 drink per day for nonpregnant women. One drink equals 12 ounces of beer, 5 ounces of wine, or 1 ounces of hard liquor.  Do not use street drugs.  Do not share needles.  Ask your health care provider for help if you need support or information about quitting drugs.  Tell your health care provider if you often feel depressed.  Tell your health care provider if you have ever been abused or do not feel safe at home. This information is not intended to replace advice given to you by your health care provider. Make sure you discuss any questions you have with your health care provider. Document Released: 12/10/2010 Document Revised: 11/02/2015 Document Reviewed: 02/28/2015  2017 Elsevier   Hearing Loss Introduction Hearing loss is a partial or total loss of the ability to hear. This can be temporary or permanent, and it can happen in one or both ears. Hearing loss may be referred to as deafness. Medical care is necessary to treat hearing loss properly and to prevent the condition from getting worse. Your hearing may partially or completely come back, depending on what caused your hearing loss and how severe it is. In some cases, hearing loss is permanent. What are the causes? Common causes of hearing loss include:  Too much wax in the ear canal.  Infection of the ear canal or middle ear.  Fluid in the middle ear.  Injury to the ear or  surrounding  area.  An object stuck in the ear.  Prolonged exposure to loud sounds, such as music. Less common causes of hearing loss include:  Tumors in the ear.  Viral or bacterial infections, such as meningitis.  A hole in the eardrum (perforated eardrum).  Problems with the hearing nerve that sends signals between the brain and the ear.  Certain medicines. What are the signs or symptoms? Symptoms of this condition may include:  Difficulty telling the difference between sounds.  Difficulty following a conversation when there is background noise.  Lack of response to sounds in your environment. This may be most noticeable when you do not respond to startling sounds.  Needing to turn up the volume on the television, radio, etc.  Ringing in the ears.  Dizziness.  Pain in the ears. How is this diagnosed? This condition is diagnosed based on a physical exam and a hearing test (audiometry). The audiometry test will be performed by a hearing specialist (audiologist). You may also be referred to an ear, nose, and throat (ENT) specialist (otolaryngologist). How is this treated? Treatment for recent onset of hearing loss may include:  Ear wax removal.  Being prescribed medicines to prevent infection (antibiotics).  Being prescribed medicines to reduce inflammation (corticosteroids). Follow these instructions at home:  If you were prescribed an antibiotic medicine, take it as told by your health care provider. Do not stop taking the antibiotic even if you start to feel better.  Take over-the-counter and prescription medicines only as told by your health care provider.  Avoid loud noises.  Return to your normal activities as told by your health care provider. Ask your health care provider what activities are safe for you.  Keep all follow-up visits as told by your health care provider. This is important. Contact a health care provider if:  You feel dizzy.  You develop new  symptoms.  You vomit or feel nauseous.  You have a fever. Get help right away if:  You develop sudden changes in your vision.  You have severe ear pain.  You have new or increased weakness.  You have a severe headache. This information is not intended to replace advice given to you by your health care provider. Make sure you discuss any questions you have with your health care provider. Document Released: 05/27/2005 Document Revised: 11/02/2015 Document Reviewed: 10/12/2014  2017 Elsevier

## 2016-06-11 NOTE — Progress Notes (Signed)
Tiburcio Linder R., DO  

## 2016-06-12 ENCOUNTER — Telehealth: Payer: Self-pay

## 2016-06-12 NOTE — Telephone Encounter (Signed)
Call to Lucy to fup on plan Apt for Dr. Raliegh Ip to see on Friday for memory and to check foot. Agreed to cologuard but need a street address: Current address is [address] in [place], [place] I am outreaching [facility] for SW referral to assist with accessing housing for both Lucy and the patient. If this does not work out, will fup with [employer] for possible assistance.   The dtr is outreaching [employer].   Cologuard to [employer];

## 2016-06-14 ENCOUNTER — Encounter: Payer: Self-pay | Admitting: Internal Medicine

## 2016-06-14 ENCOUNTER — Ambulatory Visit (INDEPENDENT_AMBULATORY_CARE_PROVIDER_SITE_OTHER): Payer: Medicare Other | Admitting: Internal Medicine

## 2016-06-14 ENCOUNTER — Other Ambulatory Visit: Payer: Self-pay

## 2016-06-14 VITALS — BP 160/82 | HR 68 | Temp 98.2°F | Ht 61.0 in | Wt 128.4 lb

## 2016-06-14 DIAGNOSIS — Z6379 Other stressful life events affecting family and household: Secondary | ICD-10-CM

## 2016-06-14 DIAGNOSIS — E119 Type 2 diabetes mellitus without complications: Secondary | ICD-10-CM | POA: Diagnosis not present

## 2016-06-14 DIAGNOSIS — I10 Essential (primary) hypertension: Secondary | ICD-10-CM | POA: Diagnosis not present

## 2016-06-14 NOTE — Progress Notes (Signed)
Pre visit review using our clinic review tool, if applicable. No additional management support is needed unless otherwise documented below in the visit note. 

## 2016-06-14 NOTE — Progress Notes (Unsigned)
The patient's home needs to be evaluated and family assisted with long term resolution of housing issues, as well as noted stress due to changes in mental status of the patient per the dtr.  Updates started on overdue metrics Needs evaluation for long term plan by SW and assistance with social services, as the dtr states she has reached out to DSS and has not rec'd any assistance.  Dtr hx of stroke and other disability

## 2016-06-14 NOTE — Patient Instructions (Signed)
Limit your sodium (Salt) intake  Please check your blood pressure on a regular basis.  If it is consistently greater than 150/90, please make an office appointment.  Take a calcium supplement, plus 800-1200 units of vitamin D  Return in 6 months for follow-up  

## 2016-06-14 NOTE — Telephone Encounter (Signed)
Pt has an appointment today at 2pm

## 2016-06-14 NOTE — Progress Notes (Signed)
Subjective:    Patient ID: Julie Schmidt, female    DOB: January 02, 1941, 76 y.o.   MRN: [id number]  HPI 76 year old patient who is seen today for follow-up.  Concerns include the discomfort in the left upper back area.  Pain is aggravated by movement.  She has a history of essential hypertension.  Presently controlled off medications.  She is also a diet-controlled diabetic.  Lab Results  Component Value Date   HGBA1C 6.2 05/07/2016     BP Readings from Last 3 Encounters:  06/14/16 (!) 160/82  06/07/16 (!) 150/80  05/07/16 122/76   She is accompanied by her daughter today who is also a bit concerned about short-term memory loss.  MMSE exam with a score of 30/30  Past Medical History:  Diagnosis Date  . Arthritis    knees, back  . Diabetes mellitus   . Headache      Social History   Social History  . Marital status: Widowed    Spouse name: N/A  . Number of children: N/A  . Years of education: N/A   Occupational History  . Not on file.   Social History Main Topics  . Smoking status: Never Smoker  . Smokeless tobacco: Never Used  . Alcohol use No  . Drug use: No  . Sexual activity: Not on file   Other Topics Concern  . Not on file   Social History Narrative  . No narrative on file    Past Surgical History:  Procedure Laterality Date  . ABDOMINAL HYSTERECTOMY    . APPENDECTOMY    . BRAIN SURGERY  05/19/2014   Craniotomy for meningioma  . CARDIAC CATHETERIZATION N/A 11/25/2014   Procedure: Left Heart Cath and Coronary Angiography;  Surgeon: Jerline Pain, MD;  Location: [facility];  Service: Cardiovascular;  Laterality: N/A;  . CESAREAN SECTION    . CRANIOTOMY Left 05/19/2014   Procedure: CRANIOTOMY TUMOR EXCISION with Curve;  Surgeon: Erline Levine, MD;  Location: [facility];  Service: Neurosurgery;  Laterality: Left;  Left frontal craniotomy for meningioma with brain lab    Family History  Problem Relation Age of Onset  . Cancer Father      Allergies  Allergen Reactions  . Norvasc [Amlodipine] Other (See Comments)    Dizziness and Chest pains at night when lies down  . Oxycodone-Acetaminophen Nausea And Vomiting    Current Outpatient Prescriptions on File Prior to Visit  Medication Sig Dispense Refill  . ibuprofen (ADVIL,MOTRIN) 200 MG tablet Take 200 mg by mouth every 6 (six) hours as needed.     No current facility-administered medications on file prior to visit.     BP (!) 160/82 (BP Location: Right Arm, Patient Position: Sitting, Cuff Size: Normal)   Pulse 68   Temp 98.2 F (36.8 C) (Oral)   Ht 5\' 1"  (1.549 m)   Wt 128 lb 6.4 oz (58.2 kg)   SpO2 98%   BMI 24.26 kg/m      Review of Systems  Constitutional: Negative.   HENT: Negative for congestion, dental problem, hearing loss, rhinorrhea, sinus pressure, sore throat and tinnitus.   Eyes: Negative for pain, discharge and visual disturbance.  Respiratory: Negative for cough and shortness of breath.   Cardiovascular: Negative for chest pain, palpitations and leg swelling.  Gastrointestinal: Negative for abdominal distention, abdominal pain, blood in stool, constipation, diarrhea, nausea and vomiting.  Genitourinary: Negative for difficulty urinating, dysuria, flank pain, frequency, hematuria, pelvic pain, urgency, vaginal bleeding,  vaginal discharge and vaginal pain.  Musculoskeletal: Positive for back pain. Negative for arthralgias, gait problem and joint swelling.  Skin: Negative for rash.  Neurological: Negative for dizziness, syncope, speech difficulty, weakness, numbness and headaches.  Hematological: Negative for adenopathy.  Psychiatric/Behavioral: Positive for decreased concentration. Negative for agitation, behavioral problems and dysphoric mood. The patient is not nervous/anxious.        Objective:   Physical Exam  Constitutional: She is oriented to person, place, and time. She appears well-developed and well-nourished.  Blood pressure  142/78  HENT:  Head: Normocephalic.  Right Ear: External ear normal.  Left Ear: External ear normal.  Mouth/Throat: Oropharynx is clear and moist.  Eyes: Conjunctivae and EOM are normal. Pupils are equal, round, and reactive to light.  Neck: Normal range of motion. Neck supple. No thyromegaly present.  Cardiovascular: Normal rate, regular rhythm, normal heart sounds and intact distal pulses.   Pulmonary/Chest: Effort normal and breath sounds normal.  Abdominal: Soft. Bowel sounds are normal. She exhibits no mass. There is no tenderness.  Musculoskeletal: Normal range of motion.  Lymphadenopathy:    She has no cervical adenopathy.  Neurological: She is alert and oriented to person, place, and time.  MMSE 30/30  Skin: Skin is warm and dry. No rash noted.  Psychiatric: She has a normal mood and affect. Her behavior is normal.          Assessment & Plan:   Essential hypertension.  Remains stable off medication History of short-term memory loss.  MMSE 30/30 Diet-controlled diabetes.  Last hemoglobin A1c remains in a nondiabetic range Left upper back discomfort.  Musculoligamentous.  Will try warm compresses, gentle massage and stretching  Follow-up 6 months  Eddie Payette Pilar Plate

## 2016-06-18 NOTE — Telephone Encounter (Signed)
Ms Zasada called today and stated no one had contacted her. Informed her referral to [facility] was completed. May be delayed due to the holidays but will call and check on   Call to [facility] and am no hold.  Reviewed the case and confirmed [facility] does have the referral

## 2016-06-19 ENCOUNTER — Other Ambulatory Visit: Payer: Self-pay

## 2016-06-19 NOTE — Patient Outreach (Signed)
[facility]) Care Management  06/19/2016  Julie Schmidt 11/29/1940 [id number]  REFERRAL DATE: 06/15/15 REFERRAL SOURCE: Primary MD REFERRAL REASON: Memory and emotional disinhibited.  Telephone call to patient regarding primary MD referral. Unable to reach patient. HIPAA compliant voice message left with call back phone number.   PLAN:  RNCM will attempt 2nd telephone call to patient within  1 week.  [name] RN,BSN,CCM [facility] Telephonic  [phone]

## 2016-06-21 ENCOUNTER — Emergency Department (HOSPITAL_COMMUNITY)
Admission: EM | Admit: 2016-06-21 | Discharge: 2016-06-22 | Disposition: A | Discharge status: Home / Self Care | Payer: Medicare Other | Attending: Emergency Medicine | Admitting: Emergency Medicine

## 2016-06-21 ENCOUNTER — Encounter (HOSPITAL_COMMUNITY): Payer: Self-pay

## 2016-06-21 ENCOUNTER — Other Ambulatory Visit: Payer: Self-pay

## 2016-06-21 ENCOUNTER — Emergency Department (HOSPITAL_COMMUNITY): Payer: Medicare Other

## 2016-06-21 DIAGNOSIS — Z7982 Long term (current) use of aspirin: Secondary | ICD-10-CM | POA: Diagnosis not present

## 2016-06-21 DIAGNOSIS — Z9119 Patient's noncompliance with other medical treatment and regimen: Secondary | ICD-10-CM | POA: Diagnosis not present

## 2016-06-21 DIAGNOSIS — R079 Chest pain, unspecified: Secondary | ICD-10-CM | POA: Diagnosis not present

## 2016-06-21 DIAGNOSIS — I10 Essential (primary) hypertension: Secondary | ICD-10-CM | POA: Diagnosis not present

## 2016-06-21 DIAGNOSIS — R0789 Other chest pain: Secondary | ICD-10-CM | POA: Diagnosis not present

## 2016-06-21 DIAGNOSIS — E119 Type 2 diabetes mellitus without complications: Secondary | ICD-10-CM | POA: Diagnosis not present

## 2016-06-21 DIAGNOSIS — R0781 Pleurodynia: Secondary | ICD-10-CM | POA: Diagnosis not present

## 2016-06-21 HISTORY — DX: Essential (primary) hypertension: I10

## 2016-06-21 LAB — BASIC METABOLIC PANEL
Anion gap: 8 (ref 5–15)
BUN: 12 mg/dL (ref 6–20)
CALCIUM: 9.8 mg/dL (ref 8.9–10.3)
CHLORIDE: 105 mmol/L (ref 101–111)
CO2: 28 mmol/L (ref 22–32)
CREATININE: 0.79 mg/dL (ref 0.44–1.00)
GFR calc non Af Amer: >60 mL/min (ref >60)
Glucose, Bld: 101 mg/dL — ABNORMAL HIGH (ref 65–99)
Potassium: 4.5 mmol/L (ref 3.5–5.1)
SODIUM: 141 mmol/L (ref 135–145)

## 2016-06-21 LAB — CBC
HCT: 38 % (ref 36.0–46.0)
HEMOGLOBIN: 12.3 g/dL (ref 12.0–15.0)
MCH: 29.6 pg (ref 26.0–34.0)
MCHC: 32.4 g/dL (ref 30.0–36.0)
MCV: 91.6 fL (ref 78.0–100.0)
PLATELETS: 303 10*3/uL (ref 150–400)
RBC: 4.15 MIL/uL (ref 3.87–5.11)
RDW: 12.6 % (ref 11.5–15.5)
WBC: 5.8 10*3/uL (ref 4.0–10.5)

## 2016-06-21 MED ORDER — MORPHINE SULFATE (PF) 4 MG/ML IV SOLN
4.0000 mg | Freq: Once | INTRAVENOUS | Status: AC
  Administered 2016-06-21: 4 mg via INTRAVENOUS
  Filled 2016-06-21: qty 1

## 2016-06-21 NOTE — ED Provider Notes (Signed)
[facility] DEPT Provider Note   CSN: [id number] Arrival date & time: 06/21/16  1710     History   Chief Complaint Chief Complaint  Patient presents with  . Chest Pain    HPI Julie Schmidt is a 76 y.o. female.  HPI Complains of left-sided chest pain rating to left back onset one week ago. Pain is constant and pleuritic, gradual in onset, Nonexertional. She's been seen by her primary care physician and told that it was chest wall pain. She is treated herself with Tylenol, without relief. She denies cough. Admits to mild shortness of breath. No other associated symptoms. Patient had normal cardiac catheterization June 2016. Past Medical History:  Diagnosis Date  . Arthritis    knees, back  . Diabetes mellitus   . Headache   . Hypertension     Patient Active Problem List   Diagnosis Date Noted  . Essential hypertension 05/07/2016  . Abnormal nuclear stress test 11/25/2014  . Abnormal EKG 11/02/2014  . Prolonged Q-T interval on ECG 11/02/2014  . Diet-controlled diabetes mellitus ([place]) 11/02/2014  . Helicobacter pylori gastritis 09/18/2014  . Diabetes mellitus without complication ([place]) [id number]  . Meningioma ([place]) 05/19/2014  . Back pain, chronic 10/25/2011    Past Surgical History:  Procedure Laterality Date  . ABDOMINAL HYSTERECTOMY    . APPENDECTOMY    . BRAIN SURGERY  05/19/2014   Craniotomy for meningioma  . CARDIAC CATHETERIZATION N/A 11/25/2014   Procedure: Left Heart Cath and Coronary Angiography;  Surgeon: Jerline Pain, MD;  Location: [facility];  Service: Cardiovascular;  Laterality: N/A;  . CESAREAN SECTION    . CRANIOTOMY Left 05/19/2014   Procedure: CRANIOTOMY TUMOR EXCISION with Curve;  Surgeon: Erline Levine, MD;  Location: [facility];  Service: Neurosurgery;  Laterality: Left;  Left frontal craniotomy for meningioma with brain lab    OB History    No data available       Home Medications    Prior to Admission medications     Medication Sig Start Date End Date Taking? Authorizing Provider  aspirin 81 MG tablet Take 81 mg by mouth daily.   Yes Historical Provider, MD  ibuprofen (ADVIL,MOTRIN) 200 MG tablet Take 200 mg by mouth every 6 (six) hours as needed.    Historical Provider, MD    Family History Family History  Problem Relation Age of Onset  . Cancer Father     Social History Social History  Substance Use Topics  . Smoking status: Never Smoker  . Smokeless tobacco: Never Used  . Alcohol use No     Allergies   Norvasc [amlodipine] and Oxycodone-acetaminophen   Review of Systems Review of Systems  Constitutional: Negative.   HENT: Negative.   Respiratory: Positive for shortness of breath.   Cardiovascular: Positive for chest pain.  Gastrointestinal: Negative.   Musculoskeletal: Negative.   Skin: Negative.   Allergic/Immunologic: Positive for immunocompromised state.       Diabetic  Neurological: Negative.   Psychiatric/Behavioral: Negative.   All other systems reviewed and are negative.    Physical Exam Updated Vital Signs BP (!) 212/92 (BP Location: Right Arm)   Pulse 69   Temp 98.4 F (36.9 C) (Oral)   Resp 18   Ht 5\' 1"  (1.549 m)   Wt 129 lb 9.6 oz (58.8 kg)   SpO2 98%   BMI 24.49 kg/m   Physical Exam  Constitutional: She appears well-developed and well-nourished.  HENT:  Head: Normocephalic and atraumatic.  Eyes: Conjunctivae are normal. Pupils are equal, round, and reactive to light.  Neck: Neck supple. No tracheal deviation present. No thyromegaly present.  Cardiovascular: Normal rate and regular rhythm.   No murmur heard. Pulmonary/Chest: Effort normal and breath sounds normal. She exhibits tenderness.  Chest is tender left side anteriorly. Pain is reproducible by forcible flexion of left shoulder  Abdominal: Soft. Bowel sounds are normal. She exhibits no distension. There is no tenderness.  Musculoskeletal: Normal range of motion. She exhibits no edema or  tenderness.  Neurological: She is alert. Coordination normal.  Skin: Skin is warm and dry. No rash noted.  Psychiatric: She has a normal mood and affect.  Nursing note and vitals reviewed.    ED Treatments / Results  Labs (all labs ordered are listed, but only abnormal results are displayed) Labs Reviewed  BASIC METABOLIC PANEL - Abnormal; Notable for the following:       Result Value   Glucose, Bld 101 (*)    All other components within normal limits  CBC  I-STAT TROPOININ, ED    EKG  EKG Interpretation  Date/Time:  Friday June 21 2016 17:18:10 EST Ventricular Rate:  70 PR Interval:  158 QRS Duration: 86 QT Interval:  410 QTC Calculation: 442 R Axis:   -59 Text Interpretation:  Normal sinus rhythm Left axis deviation Moderate voltage criteria for LVH, may be normal variant Anterior infarct , age undetermined Abnormal ECG Confirmed by Winfred Leeds  MD, Cataleyah Colborn [phone]) on 06/21/2016 10:25:24 PM       Radiology Dg Chest 2 View  Result Date: 06/21/2016 CLINICAL DATA:  Left-sided chest pain and back pain for 1 week. This increases with taking a deep breath. EXAM: CHEST  2 VIEW COMPARISON:  CT, 09/17/2014.  Chest radiograph, 03/24/2014. FINDINGS: Cardiac silhouette is mildly enlarged. No mediastinal or hilar masses. No evidence of adenopathy. There is streaky opacity at the lung bases, increased when compared to the prior chest radiograph. This is likely due to atelectasis. There is no convincing pneumonia. No pulmonary edema. No pleural effusion or pneumothorax. Skeletal structures are demineralized but intact. IMPRESSION: 1. No acute cardiopulmonary disease. 2. Streaky by basilar opacity is likely atelectasis. No convincing pneumonia. No pulmonary edema. 3. Mild stable cardiomegaly. Electronically Signed   By: Lajean Manes M.D.   On: 06/21/2016 18:21    Procedures Procedures (including critical care time)  Medications Ordered in ED Medications  morphine 4 MG/ML injection 4 mg  (not administered)   Results for orders placed or performed during the hospital encounter of [id number]  Basic metabolic panel  Result Value Ref Range   Sodium 141 135 - 145 mmol/L   Potassium 4.5 3.5 - 5.1 mmol/L   Chloride 105 101 - 111 mmol/L   CO2 28 22 - 32 mmol/L   Glucose, Bld 101 (H) 65 - 99 mg/dL   BUN 12 6 - 20 mg/dL   Creatinine, Ser 0.79 0.44 - 1.00 mg/dL   Calcium 9.8 8.9 - 10.3 mg/dL   GFR calc non Af Amer >60 >60 mL/min   GFR calc Af Amer >60 >60 mL/min   Anion gap 8 5 - 15  CBC  Result Value Ref Range   WBC 5.8 4.0 - 10.5 K/uL   RBC 4.15 3.87 - 5.11 MIL/uL   Hemoglobin 12.3 12.0 - 15.0 g/dL   HCT 38.0 36.0 - 46.0 %   MCV 91.6 78.0 - 100.0 fL   MCH 29.6 26.0 - 34.0 pg   MCHC 32.4 30.0 -  36.0 g/dL   RDW 12.6 11.5 - 15.5 %   Platelets 303 150 - 400 K/uL  I-stat troponin, ED  Result Value Ref Range   Troponin i, poc 0.00 0.00 - 0.08 ng/mL   Comment 3           Dg Chest 2 View  Result Date: 06/21/2016 CLINICAL DATA:  Left-sided chest pain and back pain for 1 week. This increases with taking a deep breath. EXAM: CHEST  2 VIEW COMPARISON:  CT, 09/17/2014.  Chest radiograph, 03/24/2014. FINDINGS: Cardiac silhouette is mildly enlarged. No mediastinal or hilar masses. No evidence of adenopathy. There is streaky opacity at the lung bases, increased when compared to the prior chest radiograph. This is likely due to atelectasis. There is no convincing pneumonia. No pulmonary edema. No pleural effusion or pneumothorax. Skeletal structures are demineralized but intact. IMPRESSION: 1. No acute cardiopulmonary disease. 2. Streaky by basilar opacity is likely atelectasis. No convincing pneumonia. No pulmonary edema. 3. Mild stable cardiomegaly. Electronically Signed   By: Lajean Manes M.D.   On: 06/21/2016 18:21    Initial Impression / Assessment and Plan / ED Course  I have reviewed the triage vital signs and the nursing notes.  Pertinent labs & imaging results that were  available during my care of the patient were reviewed by me and considered in my medical decision making (see chart for details).  Clinical Course    Patient is signed out to Dr. Tyrone Nine at 12:25 AM patient has long-standing history of hypertension. She's been noncompliant with blood pressure medicine for several weeks. We'll write prescription for Ziac which she can start tomorrow assuming she is stable for discharge.   Final Clinical Impressions(s) / ED Diagnoses  Diagnosis pleuritic chest pain #2 hypertension #3 medication noncompliance  Final diagnoses:  None    New Prescriptions New Prescriptions   No medications on file     Orlie Dakin, MD 06/22/16 [phone]

## 2016-06-21 NOTE — ED Triage Notes (Signed)
Per Pt, Pt is coming from home with complaints of left chest pain that is worse when she breaths. Pt reports seeing PCP and being diagnosed with musculoskeletal pain, but reports no relief with medication. Complains of SOB. Denies dizziness, cough, lightheadedness.

## 2016-06-21 NOTE — Patient Outreach (Signed)
[facility]) Care Management  06/21/2016  Julie Schmidt 05-25-1941 [id number]  REFERRAL DATE; 06/14/16  REFERRAL SOURCE: primary MD  REFERRAL REASON; memory and emotional concerns.  Can't find a place to live.  CONSENT;  HIPAA verified with patient.  Patient gave verbal authorization to speak with her daughter Julie Schmidt regarding all of her personal health information.   SUBJECTIVE;  Telephone call to patient regarding primary MD referral. Discussed and offered [facility] care management to patient. Patient verbally agreed to services. Both patient and patients daughter, Julie Schmidt on phone together.  Patient states she and her daughter are living with a friend. Patient states she needs to find a place to live. Patient verifies she had Medicare and Medicaid. Patient denies having anyone currently assisting her with housing need.   Patient states she saw her primary MD on 06/14/16. Daughter states patient has diabetes and hypertension but does not take any medication.  Daughter states patient is on only one medication, aspirin at this time. Daughter states she provides patient transportation to her doctor appointments. Daughter states she has to accompany patient to her appointments because she may not understand fully what doctor is asking.  ASSESSMENT; Per primary MD note from 06/14/16, patient remains stable of medication for blood pressure. Patient has diet controlled diabetes.   PLAN:  RNCM will refer patient to Education officer, museum.  [name] RN,BSN,CCM [facility] Telephonic  [phone]

## 2016-06-22 ENCOUNTER — Emergency Department (HOSPITAL_COMMUNITY): Payer: Medicare Other

## 2016-06-22 DIAGNOSIS — R0789 Other chest pain: Secondary | ICD-10-CM | POA: Diagnosis not present

## 2016-06-22 DIAGNOSIS — R0781 Pleurodynia: Secondary | ICD-10-CM | POA: Diagnosis not present

## 2016-06-22 LAB — I-STAT TROPONIN, ED: TROPONIN I, POC: 0 ng/mL (ref 0.00–0.08)

## 2016-06-22 MED ORDER — IOPAMIDOL (ISOVUE-370) INJECTION 76%
INTRAVENOUS | Status: AC
  Filled 2016-06-22: qty 100

## 2016-06-22 MED ORDER — BISOPROLOL-HYDROCHLOROTHIAZIDE 10-6.25 MG PO TABS: 1.0000 | ORAL_TABLET | Freq: Every day | ORAL | 0 refills | Status: DC

## 2016-06-22 MED ORDER — IOPAMIDOL (ISOVUE-370) INJECTION 76%
100.0000 mL | Freq: Once | INTRAVENOUS | Status: AC | PRN
  Administered 2016-06-22: 100 mL via INTRAVENOUS

## 2016-06-22 NOTE — Discharge Instructions (Signed)
Follow up with your family doc. Return for worsening symptoms.  °

## 2016-06-22 NOTE — ED Notes (Signed)
Patient Alert and oriented X4. Stable and ambulatory. Patient verbalized understanding of the discharge instructions.  Patient belongings were taken by the patient.  

## 2016-06-24 ENCOUNTER — Encounter: Payer: Self-pay | Admitting: *Deleted

## 2016-06-25 DIAGNOSIS — Z1211 Encounter for screening for malignant neoplasm of colon: Secondary | ICD-10-CM | POA: Diagnosis not present

## 2016-06-25 DIAGNOSIS — Z1212 Encounter for screening for malignant neoplasm of rectum: Secondary | ICD-10-CM | POA: Diagnosis not present

## 2016-06-26 ENCOUNTER — Other Ambulatory Visit: Payer: Self-pay | Admitting: *Deleted

## 2016-06-26 LAB — I-STAT TROPONIN, ED: Troponin i, poc: 0 ng/mL (ref 0.00–0.08)

## 2016-06-26 NOTE — Patient Outreach (Signed)
[facility]) Care Management  06/26/2016  MARIEKA BELUE [birth year] [id number]   CSW made an initial attempt to try and contact patient today to perform phone assessment, as well as assess and assist with social needs and services, without success.  A HIPAA complaint message was left for patient on voicemail.  CSW is currently awaiting a return call.  CSW will make a second outreach attempt in one week if CSW does not receive a return call from patient in the meantime. [name], BSW, MSW, LCSW  Licensed Education officer, environmental Health System  Mailing [place] [address], [place], [place] [address] Physical Address-[address], [place], [place] [address] Toll Free Main # [phone] Fax # [phone] Cell # [phone]  Fax # [phone]  [name]

## 2016-07-02 DIAGNOSIS — H25813 Combined forms of age-related cataract, bilateral: Secondary | ICD-10-CM | POA: Diagnosis not present

## 2016-07-02 DIAGNOSIS — H401112 Primary open-angle glaucoma, right eye, moderate stage: Secondary | ICD-10-CM | POA: Diagnosis not present

## 2016-07-02 DIAGNOSIS — H401121 Primary open-angle glaucoma, left eye, mild stage: Secondary | ICD-10-CM | POA: Diagnosis not present

## 2016-07-02 DIAGNOSIS — E119 Type 2 diabetes mellitus without complications: Secondary | ICD-10-CM | POA: Diagnosis not present

## 2016-07-02 LAB — HM DIABETES EYE EXAM

## 2016-07-03 ENCOUNTER — Other Ambulatory Visit: Payer: Self-pay | Admitting: *Deleted

## 2016-07-03 NOTE — Patient Outreach (Signed)
[facility]) Care Management  07/03/2016  Julie Schmidt 05-14-1941 [id number]  CSW made a second attempt to try and contact patient today to perform phone assessment, as well as assess and assist with social needs and services, without success.  A HIPAA complaint message was left for patient on voicemail.  CSW is currently awaiting a return call.  CSW will make a third and final outreach attempt in one week, if CSW does not receive a return call from patient in the meantime. [name], BSW, MSW, LCSW  Licensed Education officer, environmental Health System  Mailing [place] [address] [place], [place] [address] Physical Address-[address], [place], [place] [address] Toll Free Main # [phone] Fax # [phone] Cell # [phone]  Office # [phone] [name]

## 2016-07-04 ENCOUNTER — Other Ambulatory Visit: Payer: Self-pay | Admitting: *Deleted

## 2016-07-04 ENCOUNTER — Encounter: Payer: Self-pay | Admitting: *Deleted

## 2016-07-04 NOTE — Patient Outreach (Signed)
[facility] Schmidt) Care Management  07/04/2016  Julie Schmidt 1940/11/24 [id number]   CSW received a return call from patient and patient's daughter, [name] today, in response to the HIPAA compliant message that CSW left for patient yesterday (Wednesday, January 24th) on voicemail.  CSW introduced self, explained role and types of services provided through [facility] ([facility]).  CSW further explained to patient that CSW works with patient's Primary Care Physician, Dr. Bluford Kaufmann. CSW then explained the reason for the call, indicating that [name], Telephonic RNCM, also with [facility], thought that patient would benefit from social work services and resources to assist with adequate housing arrangements.  CSW obtained two HIPAA compliant identifiers from patient, which included patient's name and date of birth.   Patient and [name] admit that they are in need of housing assistance, as they are currently staying with a friend, [name] and tensions are running high.  CSW is aware that patient has active Traditional Medicare and Adult Medicaid, as well as receives Physicist, medical, both through the [place].  CSW agreed to assist patient and [name] with applying for Section 8 Housing through the [employer], explaining that they are currently accepting applications but on a two year waiting list.  CSW also agreed to mail patient a packet of resource information, including all of the following: [place] for [place] A request has been made to [name], Care Psychologist, prison and probation services with [facility], to mail the following resources to patient at their current place of residence: [address] [place], [place] [address] CSW has scheduled an initial  home visit with patient and [name] for Friday, February 2nd to review the packet of resource information, as well as assist with the referral process and completion of applications. [name], BSW, MSW, LCSW  Licensed Education officer, environmental Health System  Mailing [place] [address], [place], [place] [address] Physical Address-[address], [place], [place] [address] Toll Free Main # [phone] Fax # [phone] Cell # [phone]  Office # [phone] [name]

## 2016-07-05 ENCOUNTER — Encounter: Payer: Self-pay | Admitting: Internal Medicine

## 2016-07-06 DIAGNOSIS — H2511 Age-related nuclear cataract, right eye: Secondary | ICD-10-CM | POA: Diagnosis not present

## 2016-07-08 NOTE — Patient Outreach (Signed)
[facility]) Care Management  07/08/2016  Julie Schmidt 11-22-1940 [id number]  Request received from [name], [place] to mail patient welcome packet, consent for treatment, senior living resource guide, affordable housing resources, and low income housing resources at specified address.   Jacqulynn Cadet  [facility] Care Management Assistant

## 2016-07-12 ENCOUNTER — Other Ambulatory Visit: Payer: Self-pay | Admitting: *Deleted

## 2016-07-12 DIAGNOSIS — H401112 Primary open-angle glaucoma, right eye, moderate stage: Secondary | ICD-10-CM | POA: Diagnosis not present

## 2016-07-12 DIAGNOSIS — H401122 Primary open-angle glaucoma, left eye, moderate stage: Secondary | ICD-10-CM | POA: Diagnosis not present

## 2016-07-12 DIAGNOSIS — H04123 Dry eye syndrome of bilateral lacrimal glands: Secondary | ICD-10-CM | POA: Diagnosis not present

## 2016-07-12 DIAGNOSIS — H25813 Combined forms of age-related cataract, bilateral: Secondary | ICD-10-CM | POA: Diagnosis not present

## 2016-07-12 NOTE — Patient Outreach (Signed)
[facility]) Care Management  07/12/2016  Julie Schmidt 1940/07/27 [id number]   CSW was able to meet with patient and patient's daughter, [name], as well as patient's friend, [name] today to perform the initial home visit.  Patient and [name] are currently living with [name], but reported that this was only supposed to be temporary until they found their own place to live, not realizing that it would take this long.  [name] admitted that she received confirmation from the [employer] that their application for [employer] had been reviewed, approved and that they are currently on the waiting list.  CSW reminded patient and [name] that the current waiting list is 2+ years, at present.  Both voiced understanding and were agreeable to having CSW assist them with pursuing other options. During the visit today, CSW went through the [place] with patient and [name], explaining that [name] may or may not be able to reside with patient, depending upon the type of facility with which patient enters.  [name] admitted that she must be able to live with patient; therefore, that eliminated a lot of choices.  CSW then spoke with them about low-income housing, but patient, nor [name] were pleased with a lot of the locations.  A great deal of time was spent just looking at the available resources; therefore, CSW felt the need to schedule another home visit within the next week.  CSW will plan to meet with patient and [name] for the next scheduled home visit on Friday, February 9th to continue to assist with housing resources and possible referrals. [name], BSW, MSW, LCSW  Licensed Education officer, environmental Health System  Mailing [place] [address], [place], [place] [address] Physical Address-[address], [place], [place] [address] Toll Free Main #  [phone] Fax # [phone] Cell # [phone]  Office # [phone] [name]

## 2016-07-15 DIAGNOSIS — H25012 Cortical age-related cataract, left eye: Secondary | ICD-10-CM | POA: Diagnosis not present

## 2016-07-15 DIAGNOSIS — H25812 Combined forms of age-related cataract, left eye: Secondary | ICD-10-CM | POA: Diagnosis not present

## 2016-07-15 DIAGNOSIS — H2512 Age-related nuclear cataract, left eye: Secondary | ICD-10-CM | POA: Diagnosis not present

## 2016-07-15 DIAGNOSIS — H409 Unspecified glaucoma: Secondary | ICD-10-CM | POA: Diagnosis not present

## 2016-07-19 ENCOUNTER — Other Ambulatory Visit: Payer: Self-pay | Admitting: *Deleted
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
## 2016-07-25 ENCOUNTER — Other Ambulatory Visit: Payer: Self-pay | Admitting: *Deleted

## 2016-07-25 NOTE — Patient Outreach (Signed)
[facility]) Care Management  07/25/2016  Julie Schmidt 1941-03-10 [id number]   CSW was able to make contact with patient's daughter, [name] today to follow-up regarding housing options for her and patient.  [name] was driving into work at the time of CSW's call, so the conversation was very brief.  CSW explained to [name] that [place] continues to try and contact a representative at [employer] to check the status of their application for Section 8 Housing through the [employer], but have been unsuccessful in all attempts.  CSW provided [name] with the contact number for [employer] (# [phone]), as well as the case # [id number], encouraging her to continue to follow-up during CSW's absence.  CSW reminded [name] that [place] will be out of the office until Monday, March 26th, but to contact the main [place] [facility] office if social work services are needed in the meantime.  Otherwise, CSW will plan to follow-up with patient and [name] on the 26th.  [name] voiced understanding and was agreeable to this plan. [name], BSW, MSW, LCSW  Licensed Education officer, environmental Health System  Mailing [place] [address] [place], [place] [address] Physical Address-[address], [place],  [address] Toll Free Main # [phone] Fax # [phone] Cell # [phone]  Office # [phone] [name]

## 2016-08-05 ENCOUNTER — Other Ambulatory Visit: Payer: Self-pay | Admitting: *Deleted

## 2016-08-06 NOTE — Patient Outreach (Signed)
[name]  08/06/2016  Julie Schmidt 1940-11-21 [id number]   CSW was able to meet with patient and patient's daughter Julie Schmidt today to perform a routine home visit.  Patient and Ms. Julie Schmidt both denied having made contact with any of the housing agencies on the list provided to them by CSW.  Patient admits that she is concerned about her upcoming eye surgery, scheduled for Monday, March 5th.  Ms. Julie Schmidt is also scheduled for surgery on Friday, March 9th and is preoccupied with thoughts of becoming paralyzed, as she will have surgery on her spinal cord.  CSW offered counseling and supportive services to both patient and Ms. Julie Schmidt. CSW was able to make phone contact with several housing agencies, but explained to patient and Ms. Julie Schmidt that they were all outside their monthly budget, as they would be responsible for paying rent, as well as utilities.  Patient admits that their best option at this point would be to wait for housing to become available through Section 8.  Patient and Ms. Julie Schmidt are aware that CSW was able to get them upgraded to a two-bedroom unit, as soon as one becomes available, as Ms. Julie Schmidt had only requested housing in a one-bedroom unit, even though they plan to live together. CSW provided patient with the contact information to [employer], as well as the name of the representative they will need to speak with, so that they are able to routinely check their name on the waiting list for housing services.  CSW will make contact with patient on Tuesday, March 13th to follow-up regarding patient's and [name]'s surgeries, as well as to check the status of their housing situation.  Patient and Ms. Julie Schmidt voiced understanding and were agreeable with this plan. Julie Schmidt, BSW, MSW, LCSW  Licensed Education officer, environmental Health System  Mailing [place] [address], [place], [place]  [address] Physical Address-[address], [place], [place] [address] Toll Free Main # [phone] Fax # [phone] Cell # [phone]  Office # [phone] [name]

## 2016-08-12 DIAGNOSIS — H25011 Cortical age-related cataract, right eye: Secondary | ICD-10-CM | POA: Diagnosis not present

## 2016-08-12 DIAGNOSIS — H25811 Combined forms of age-related cataract, right eye: Secondary | ICD-10-CM | POA: Diagnosis not present

## 2016-08-12 DIAGNOSIS — H2511 Age-related nuclear cataract, right eye: Secondary | ICD-10-CM | POA: Diagnosis not present

## 2016-08-20 ENCOUNTER — Encounter: Payer: Self-pay | Admitting: *Deleted

## 2016-08-20 ENCOUNTER — Other Ambulatory Visit: Payer: Self-pay | Admitting: *Deleted

## 2016-08-20 NOTE — Patient Outreach (Signed)
[facility]) Care Management  08/20/2016  Julie Schmidt 22-Sep-1940 [id number]   CSW was able to make contact with patient and patient's daughter, Julie Schmidt today to follow-up regarding their recent surgical procedures to ensure that things went smoothly.  Patient indicated that she is doing quite well, but that Ms. Julie Schmidt has a slow and painful recovery process, lasting about three weeks.  Patient reported that she has been caring for Ms. Julie Schmidt, ensuring that she receives her pain medications on time.  Ms. Julie Schmidt is hopeful that this procedure worked and that she will not be limited in her activities due to chronic back pain.  Patient admitted that she has not received a call from a representative with Section 8 Housing regarding availability, but that she has spoken with the owner of the house where she and Ms. Julie Schmidt currently live, and that they will plan to remain in his place of residence until a two-bedroom home is available for them through the [employer].  Patient and Ms. Julie Schmidt have been given the contact number to the [employer], as well as the reference # for their case, and encouraged to contact them at least once per month to check the status of their application.  Patient and Ms. Julie Schmidt voiced understanding and were agreeable to this plan.  Both have been encouraged to contact CSW directly if any additional social work needs arise in the near future.  CSW will perform a case closure on patient, as all goals of treatment have been met from social work standpoint and no additional social work needs have been identified at this time.  CSW will fax an update to patient's Primary Care Physician, Julie Schmidt  to ensure that they are aware of CSW's involvement with patient's plan of care.  CSW will submit a case closure request to Julie Schmidt, Care Management Assistant with [facility], in the form of an In  [employer].   Julie Schmidt, BSW, MSW, LCSW  Licensed Education officer, environmental Health System  Mailing [place] [address] [place], [place] [address] Physical Address-[address], [place], [place] [address] Toll Free Main # [phone] Fax # [phone] Cell # [phone]  Office # [phone] [email or web address]

## 2016-09-03 ENCOUNTER — Ambulatory Visit: Payer: Medicare Other | Admitting: Internal Medicine

## 2016-09-06 ENCOUNTER — Ambulatory Visit: Payer: Medicare Other | Admitting: Internal Medicine

## 2016-09-10 ENCOUNTER — Encounter: Payer: Self-pay | Admitting: Internal Medicine

## 2016-09-10 ENCOUNTER — Ambulatory Visit (INDEPENDENT_AMBULATORY_CARE_PROVIDER_SITE_OTHER): Payer: Medicare Other | Admitting: Internal Medicine

## 2016-09-10 VITALS — BP 132/70 | HR 62 | Temp 98.2°F | Ht 61.0 in | Wt 130.6 lb

## 2016-09-10 DIAGNOSIS — E119 Type 2 diabetes mellitus without complications: Secondary | ICD-10-CM | POA: Diagnosis not present

## 2016-09-10 DIAGNOSIS — I10 Essential (primary) hypertension: Secondary | ICD-10-CM

## 2016-09-10 LAB — HEMOGLOBIN A1C: Hgb A1c MFr Bld: 6.4 % (ref 4.6–6.5)

## 2016-09-10 MED ORDER — BISOPROLOL-HYDROCHLOROTHIAZIDE 10-6.25 MG PO TABS: 1.0000 | ORAL_TABLET | Freq: Every day | ORAL | 3 refills | Status: DC

## 2016-09-10 NOTE — Progress Notes (Signed)
Subjective:    Patient ID: Julie Schmidt, female    DOB: 1941/05/31, 76 y.o.   MRN: [id number]  HPI  76 year old patient has a history of diet-controlled diabetes as well as essential hypertension. She states that she has discontinued her anti-hypertensive medication and remains on aspirin therapy only She complains of some fatigue.  Otherwise no focal complaints.  She was seen in the ED earlier this year due to nonspecific chest pain.  She does have history of normal coronary arteries noted on coronary artery catheterization.  No recent chest pain  Past Medical History:  Diagnosis Date  . Arthritis    knees, back  . Diabetes mellitus   . Headache   . Hypertension      Social History   Social History  . Marital status: Widowed    Spouse name: N/A  . Number of children: N/A  . Years of education: N/A   Occupational History  . Not on file.   Social History Main Topics  . Smoking status: Never Smoker  . Smokeless tobacco: Never Used  . Alcohol use No  . Drug use: No  . Sexual activity: Not on file   Other Topics Concern  . Not on file   Social History Narrative  . No narrative on file    Past Surgical History:  Procedure Laterality Date  . ABDOMINAL HYSTERECTOMY    . APPENDECTOMY    . BRAIN SURGERY  05/19/2014   Craniotomy for meningioma  . CARDIAC CATHETERIZATION N/A 11/25/2014   Procedure: Left Heart Cath and Coronary Angiography;  Surgeon: Jerline Pain, MD;  Location: [facility];  Service: Cardiovascular;  Laterality: N/A;  . CESAREAN SECTION    . CRANIOTOMY Left 05/19/2014   Procedure: CRANIOTOMY TUMOR EXCISION with Curve;  Surgeon: Erline Levine, MD;  Location: [facility];  Service: Neurosurgery;  Laterality: Left;  Left frontal craniotomy for meningioma with brain lab    Family History  Problem Relation Age of Onset  . Cancer Father     Allergies  Allergen Reactions  . Norvasc [Amlodipine] Other (See Comments)    Dizziness and Chest  pains at night when lies down  . Oxycodone-Acetaminophen Nausea And Vomiting    Current Outpatient Prescriptions on File Prior to Visit  Medication Sig Dispense Refill  . aspirin 81 MG tablet Take 81 mg by mouth daily.    Marland Kitchen ibuprofen (ADVIL,MOTRIN) 200 MG tablet Take 200 mg by mouth every 6 (six) hours as needed.    . bisoprolol-hydrochlorothiazide (ZIAC) 10-6.25 MG tablet Take 1 tablet by mouth daily. (Patient not taking: Reported on 09/10/2016) 30 tablet 0   No current facility-administered medications on file prior to visit.     BP 132/70 (BP Location: Left Arm, Patient Position: Sitting, Cuff Size: Normal)   Pulse 62   Temp 98.2 F (36.8 C) (Oral)   Ht 5\' 1"  (1.549 m)   Wt 130 lb 9.6 oz (59.2 kg)   SpO2 98%   BMI 24.68 kg/m     Review of Systems  Constitutional: Negative.   HENT: Negative for congestion, dental problem, hearing loss, rhinorrhea, sinus pressure, sore throat and tinnitus.   Eyes: Negative for pain, discharge and visual disturbance.  Respiratory: Negative for cough and shortness of breath.   Cardiovascular: Positive for chest pain. Negative for palpitations and leg swelling.  Gastrointestinal: Negative for abdominal distention, abdominal pain, blood in stool, constipation, diarrhea, nausea and vomiting.  Genitourinary: Negative for difficulty urinating, dysuria, flank  pain, frequency, hematuria, pelvic pain, urgency, vaginal bleeding, vaginal discharge and vaginal pain.  Musculoskeletal: Negative for arthralgias, gait problem and joint swelling.  Skin: Negative for rash.  Neurological: Negative for dizziness, syncope, speech difficulty, weakness, numbness and headaches.  Hematological: Negative for adenopathy.  Psychiatric/Behavioral: Negative for agitation, behavioral problems and dysphoric mood. The patient is not nervous/anxious.        Objective:   Physical Exam  Constitutional: She is oriented to person, place, and time. She appears well-developed and  well-nourished.  Blood pressure 140/80  HENT:  Head: Normocephalic.  Right Ear: External ear normal.  Left Ear: External ear normal.  Mouth/Throat: Oropharynx is clear and moist.  Eyes: Conjunctivae and EOM are normal. Pupils are equal, round, and reactive to light.  Neck: Normal range of motion. Neck supple. No thyromegaly present.  Cardiovascular: Normal rate, regular rhythm, normal heart sounds and intact distal pulses.   Pulmonary/Chest: Effort normal and breath sounds normal.  Abdominal: Soft. Bowel sounds are normal. She exhibits no mass. There is no tenderness.  Musculoskeletal: Normal range of motion.  Lymphadenopathy:    She has no cervical adenopathy.  Neurological: She is alert and oriented to person, place, and time.  Skin: Skin is warm and dry. No rash noted.  Psychiatric: She has a normal mood and affect. Her behavior is normal.          Assessment & Plan:   Essential hypertension.  Blood pressure high normal today.  Will resume antihypertensive regimen Type 2 diabetes.  Will check hemoglobin A1c  We'll rigorous exercise encouraged Follow-up 6 months  America Sandall Pilar Plate

## 2016-09-10 NOTE — Progress Notes (Signed)
   Subjective:    Patient ID: Julie Schmidt, female    DOB: 26-Nov-1940, 76 y.o.   MRN: [id number]  HPI  Lab Results  Component Value Date   HGBA1C 6.2 05/07/2016    Review of Systems     Objective:   Physical Exam        Assessment & Plan:

## 2016-09-10 NOTE — Patient Instructions (Signed)
Limit your sodium (Salt) intake    It is important that you exercise regularly, at least 20 minutes 3 to 4 times per week.  If you develop chest pain or shortness of breath seek  medical attention.   Please check your hemoglobin A1c every 6 months  Return in 6 months for follow-up   

## 2016-09-10 NOTE — Progress Notes (Signed)
Pre visit review using our clinic review tool, if applicable. No additional management support is needed unless otherwise documented below in the visit note. 

## 2016-12-13 ENCOUNTER — Ambulatory Visit: Payer: Medicare Other | Admitting: Internal Medicine

## 2017-03-11 ENCOUNTER — Ambulatory Visit: Payer: Medicare Other | Admitting: Internal Medicine

## 2017-03-11 DIAGNOSIS — Z0289 Encounter for other administrative examinations: Secondary | ICD-10-CM

## 2017-12-18 ENCOUNTER — Encounter: Payer: Self-pay | Admitting: Internal Medicine

## 2017-12-18 ENCOUNTER — Ambulatory Visit (INDEPENDENT_AMBULATORY_CARE_PROVIDER_SITE_OTHER): Payer: Medicare Other | Admitting: Internal Medicine

## 2017-12-18 VITALS — BP 180/62 | HR 67 | Temp 98.7°F | Wt 123.0 lb

## 2017-12-18 DIAGNOSIS — E119 Type 2 diabetes mellitus without complications: Secondary | ICD-10-CM | POA: Diagnosis not present

## 2017-12-18 DIAGNOSIS — Z0000 Encounter for general adult medical examination without abnormal findings: Secondary | ICD-10-CM

## 2017-12-18 DIAGNOSIS — I10 Essential (primary) hypertension: Secondary | ICD-10-CM | POA: Diagnosis not present

## 2017-12-18 LAB — POCT GLYCOSYLATED HEMOGLOBIN (HGB A1C): HEMOGLOBIN A1C: 5.6 % (ref 4.0–5.6)

## 2017-12-18 MED ORDER — POLYETHYLENE GLYCOL 3350 17 GM/SCOOP PO POWD: 1.0000 | Freq: Once | ORAL | 0 refills | Status: AC

## 2017-12-18 MED ORDER — BISOPROLOL-HYDROCHLOROTHIAZIDE 10-6.25 MG PO TABS: 1.0000 | ORAL_TABLET | Freq: Every day | ORAL | 3 refills | Status: DC

## 2017-12-18 NOTE — Patient Instructions (Addendum)
Schedule your colonoscopy to help detect colon cancer.   Constipation, Adult Constipation is when a person:  Poops (has a bowel movement) fewer times in a week than normal.  Has a hard time pooping.  Has poop that is dry, hard, or bigger than normal.  Follow these instructions at home: Eating and drinking   Eat foods that have a lot of fiber, such as: ? Fresh fruits and vegetables. ? Whole grains. ? Beans.  Eat less of foods that are high in fat, low in fiber, or overly processed, such as: ? [place] fries. ? Hamburgers. ? Cookies. ? Candy. ? Soda.  Drink enough fluid to keep your pee (urine) clear or pale yellow. General instructions  Exercise regularly or as told by your doctor.  Go to the restroom when you feel like you need to poop. Do not hold it in.  Take over-the-counter and prescription medicines only as told by your doctor. These include any fiber supplements.  Do pelvic floor retraining exercises, such as: ? Doing deep breathing while relaxing your lower belly (abdomen). ? Relaxing your pelvic floor while pooping.  Watch your condition for any changes.  Keep all follow-up visits as told by your doctor. This is important. Contact a doctor if:  You have pain that gets worse.  You have a fever.  You have not pooped for 4 days.  You throw up (vomit).  You are not hungry.  You lose weight.  You are bleeding from the anus.  You have thin, pencil-like poop (stool). Get help right away if:  You have a fever, and your symptoms suddenly get worse.  You leak poop or have blood in your poop.  Your belly feels hard or bigger than normal (is bloated).  You have very bad belly pain.  You feel dizzy or you faint. This information is not intended to replace advice given to you by your health care provider. Make sure you discuss any questions you have with your health care provider. Document Released: 11/13/2007 Document Revised: 12/15/2015 Document  Reviewed: 11/15/2015 Elsevier Interactive Patient Education  2018 [employer].

## 2017-12-18 NOTE — Progress Notes (Signed)
Subjective:    Patient ID: Julie Schmidt, female    DOB: Apr 02, 1941, 77 y.o.   MRN: [id number]  HPI  Lab Results  Component Value Date   HGBA1C 6.4 09/10/2016   77 year old patient who has essential hypertension.   She misunderstood directions and took her blood pressure medication until her supply was depleted and has presently been off medication She has a history of diet-controlled diabetes  No prior colonoscopies.  She states that she did have a negative Cologuard 1 year ago.  There is no documentation of this in her record She is scheduled for follow-up mammogram in October  Immunization history reviewed  Past Medical History:  Diagnosis Date  . Arthritis    knees, back  . Diabetes mellitus   . Headache   . Hypertension      Social History   Socioeconomic History  . Marital status: Widowed    Spouse name: Not on file  . Number of children: Not on file  . Years of education: Not on file  . Highest education level: Not on file  Occupational History  . Not on file  Social Needs  . Financial resource strain: Not on file  . Food insecurity:    Worry: Not on file    Inability: Not on file  . Transportation needs:    Medical: Not on file    Non-medical: Not on file  Tobacco Use  . Smoking status: Never Smoker  . Smokeless tobacco: Never Used  Substance and Sexual Activity  . Alcohol use: No    Alcohol/week: 0.0 oz  . Drug use: No  . Sexual activity: Not on file  Lifestyle  . Physical activity:    Days per week: Not on file    Minutes per session: Not on file  . Stress: Not on file  Relationships  . Social connections:    Talks on phone: Not on file    Gets together: Not on file    Attends religious service: Not on file    Active member of club or organization: Not on file    Attends meetings of clubs or organizations: Not on file    Relationship status: Not on file  . Intimate partner violence:    Fear of current or ex partner: Not on file   Emotionally abused: Not on file    Physically abused: Not on file    Forced sexual activity: Not on file  Other Topics Concern  . Not on file  Social History Narrative  . Not on file    Past Surgical History:  Procedure Laterality Date  . ABDOMINAL HYSTERECTOMY    . APPENDECTOMY    . BRAIN SURGERY  05/19/2014   Craniotomy for meningioma  . CARDIAC CATHETERIZATION N/A 11/25/2014   Procedure: Left Heart Cath and Coronary Angiography;  Surgeon: Jerline Pain, MD;  Location: [facility];  Service: Cardiovascular;  Laterality: N/A;  . CESAREAN SECTION    . CRANIOTOMY Left 05/19/2014   Procedure: CRANIOTOMY TUMOR EXCISION with Curve;  Surgeon: Erline Levine, MD;  Location: [facility];  Service: Neurosurgery;  Laterality: Left;  Left frontal craniotomy for meningioma with brain lab    Family History  Problem Relation Age of Onset  . Cancer Father     Allergies  Allergen Reactions  . Norvasc [Amlodipine] Other (See Comments)    Dizziness and Chest pains at night when lies down  . Oxycodone-Acetaminophen Nausea And Vomiting    Current Outpatient  Medications on File Prior to Visit  Medication Sig Dispense Refill  . aspirin 81 MG tablet Take 81 mg by mouth daily.    . bisoprolol-hydrochlorothiazide (ZIAC) 10-6.25 MG tablet Take 1 tablet by mouth daily. 90 tablet 3  . ibuprofen (ADVIL,MOTRIN) 200 MG tablet Take 200 mg by mouth every 6 (six) hours as needed.     No current facility-administered medications on file prior to visit.     BP (!) 180/62 (BP Location: Right Arm, Patient Position: Sitting, Cuff Size: Large)   Pulse 67   Temp 98.7 F (37.1 C) (Oral)   Wt 123 lb (55.8 kg)   SpO2 96%   BMI 23.24 kg/m     Review of Systems  Constitutional: Negative.   HENT: Negative for congestion, dental problem, hearing loss, rhinorrhea, sinus pressure, sore throat and tinnitus.   Eyes: Negative for pain, discharge and visual disturbance.  Respiratory: Negative for cough and  shortness of breath.   Cardiovascular: Negative for chest pain, palpitations and leg swelling.  Gastrointestinal: Positive for constipation. Negative for abdominal distention, abdominal pain, blood in stool, diarrhea, nausea and vomiting.  Genitourinary: Negative for difficulty urinating, dysuria, flank pain, frequency, hematuria, pelvic pain, urgency, vaginal bleeding, vaginal discharge and vaginal pain.  Musculoskeletal: Negative for arthralgias, gait problem and joint swelling.  Skin: Negative for rash.  Neurological: Negative for dizziness, syncope, speech difficulty, weakness, numbness and headaches.  Hematological: Negative for adenopathy.  Psychiatric/Behavioral: Negative for agitation, behavioral problems and dysphoric mood. The patient is not nervous/anxious.        Objective:   Physical Exam  Constitutional: She is oriented to person, place, and time. She appears well-developed and well-nourished.  Blood pressure 144/80  HENT:  Head: Normocephalic.  Right Ear: External ear normal.  Left Ear: External ear normal.  Mouth/Throat: Oropharynx is clear and moist.  Eyes: Pupils are equal, round, and reactive to light. Conjunctivae and EOM are normal.  Neck: Normal range of motion. Neck supple. No thyromegaly present.  Cardiovascular: Normal rate, regular rhythm, normal heart sounds and intact distal pulses.  Pulmonary/Chest: Effort normal and breath sounds normal.  Abdominal: Soft. Bowel sounds are normal. She exhibits no mass. There is no tenderness.  Musculoskeletal: Normal range of motion.  Lymphadenopathy:    She has no cervical adenopathy.  Neurological: She is alert and oriented to person, place, and time.  Skin: Skin is warm and dry. No rash noted.  Psychiatric: She has a normal mood and affect. Her behavior is normal.          Assessment & Plan:   Essential hypertension.  Antihypertensive medication resumed History of type 2 diabetes.  Diet controlled.  Hemoglobin  A1c normal  Constipation.  Issues addressed  Follow-up 6 months with new provider Schedule colonoscopy  Marletta Lor

## 2018-01-14 ENCOUNTER — Encounter: Payer: Self-pay | Admitting: Family Medicine

## 2018-01-14 ENCOUNTER — Ambulatory Visit (INDEPENDENT_AMBULATORY_CARE_PROVIDER_SITE_OTHER): Payer: Medicare Other | Admitting: Family Medicine

## 2018-01-14 VITALS — BP 126/70 | HR 57 | Temp 98.3°F | Resp 12 | Ht 61.0 in | Wt 125.5 lb

## 2018-01-14 DIAGNOSIS — I10 Essential (primary) hypertension: Secondary | ICD-10-CM

## 2018-01-14 DIAGNOSIS — R519 Headache, unspecified: Secondary | ICD-10-CM

## 2018-01-14 DIAGNOSIS — E785 Hyperlipidemia, unspecified: Secondary | ICD-10-CM

## 2018-01-14 DIAGNOSIS — E1169 Type 2 diabetes mellitus with other specified complication: Secondary | ICD-10-CM | POA: Insufficient documentation

## 2018-01-14 DIAGNOSIS — E119 Type 2 diabetes mellitus without complications: Secondary | ICD-10-CM

## 2018-01-14 DIAGNOSIS — R001 Bradycardia, unspecified: Secondary | ICD-10-CM | POA: Diagnosis not present

## 2018-01-14 DIAGNOSIS — R51 Headache: Secondary | ICD-10-CM | POA: Diagnosis not present

## 2018-01-14 NOTE — Patient Instructions (Addendum)
A few things to remember from today's visit:   Essential hypertension  Hyperlipidemia associated with type 2 diabetes mellitus ([place]) - Plan: Lipid panel  Headache, unspecified headache type  Bradycardia  Monitor heart rate at home, let me know in about 3 to 4 weeks about readings.  Please arrange appointment with your neurosurgeon. Tomorrow you need to come for fasting labs. I will be sending a cholesterol medication depending on lab results.  Please be sure medication list is accurate. If a new problem present, please set up appointment sooner than planned today.

## 2018-01-14 NOTE — Assessment & Plan Note (Signed)
Adequately controlled. No changes in current management. Continue low salt diet. Eye exam is current. Instructed to monitor BP at home. F/U in 6 months, before if needed.

## 2018-01-14 NOTE — Assessment & Plan Note (Signed)
She is not on statin. She is coming tomorrow for fasting labs. Continue low fat diet. Will wait until FLP results are back.

## 2018-01-14 NOTE — Progress Notes (Signed)
HPI:   [name] is a 77 y.o. female, who is here today with her daughter to establish care. Her daughter helps me with interrogation, English is not her first language.  Former PCP: Dr Burnice Logan Last preventive routine visit: 05/2016  Chronic medical problems: Hypertension, hyperlipidemia, DM 2, and meningioma (s/p craniectomy) She follows annually with neurosurgeon, she is planning on arranging appointment for follow-up in the next few days.    Diabetes Mellitus II:   Diagnosed around 2003.  Currently on nonpharmacologic treatment.  Checking BS's : Does not check  Negative for abdominal pain, nausea, vomiting, polydipsia, polyuria, or polyphagia. No numbness, tingling, or burning.  Lab Results  Component Value Date   CREATININE 0.79 06/21/2016   BUN 12 06/21/2016   NA 141 06/21/2016   K 4.5 06/21/2016   CL 105 06/21/2016   CO2 28 06/21/2016    Lab Results  Component Value Date   HGBA1C 5.6 12/18/2017   HTN: She is on Bisoprolol-HCTZ  10-6.25 mg 1/2 tab daily. She does not check BP at home. Noted mild bradycardia.  She denies chest pain,palpitation,dyspnea,dyaphoresis,or syncope.   Hyperlipidemia:  Currently on nonpharmacologic treatment. Following a low fat diet: Yes.      Lab Results  Component Value Date   CHOL 205 (H) 11/18/2014   HDL 47.20 11/18/2014   LDLCALC 133 (H) 11/18/2014   TRIG 123.0 11/18/2014   CHOLHDL 4 11/18/2014     Concerns today: Headache.   For the past week she has had intermittent right temporal headache, 5/10. She is not sure if pain is coming from her ear. She has not noted ear drainage or changes in hearing. Headache is daily, it lasts a few seconds. She has not identified exacerbating or alleviating factors. No associated visual changes, nausea,vomiting,or photophobia.  Review of Systems  Constitutional: Negative for activity change, appetite change, fatigue and fever.  HENT: Negative for mouth  sores, nosebleeds and trouble swallowing.   Eyes: Negative for redness and visual disturbance.  Respiratory: Negative for cough, shortness of breath and wheezing.   Cardiovascular: Negative for chest pain, palpitations and leg swelling.  Gastrointestinal: Negative for abdominal pain, nausea and vomiting.       Negative for changes in bowel habits.  Endocrine: Negative for polydipsia, polyphagia and polyuria.  Genitourinary: Negative for decreased urine volume and hematuria.  Musculoskeletal: Negative for gait problem and myalgias.  Skin: Negative for rash and wound.  Neurological: Positive for headaches. Negative for syncope and weakness.  Psychiatric/Behavioral: Negative for confusion. The patient is nervous/anxious.       Current Outpatient Medications on File Prior to Visit  Medication Sig Dispense Refill  . aspirin 81 MG tablet Take 81 mg by mouth daily.    . bisoprolol-hydrochlorothiazide (ZIAC) 10-6.25 MG tablet Take 1 tablet by mouth daily. 90 tablet 3  . Cholecalciferol (VITAMIN D3) 2000 units TABS Take by mouth daily.    Marland Kitchen ibuprofen (ADVIL,MOTRIN) 200 MG tablet Take 200 mg by mouth every 6 (six) hours as needed.    . polyethylene glycol powder (GLYCOLAX/MIRALAX) powder MIX 255 GRAM PO ONCE FOR 1 DOSE  0   No current facility-administered medications on file prior to visit.      Past Medical History:  Diagnosis Date  . Arthritis    knees, back  . Diabetes mellitus   . Headache   . Hypertension    Allergies  Allergen Reactions  . Norvasc [Amlodipine] Other (See Comments)    Dizziness  and Chest pains at night when lies down  . Oxycodone-Acetaminophen Nausea And Vomiting    Family History  Problem Relation Age of Onset  . Cancer Father     Social History   Socioeconomic History  . Marital status: Widowed    Spouse name: Not on file  . Number of children: Not on file  . Years of education: Not on file  . Highest education level: Not on file  Occupational  History  . Not on file  Social Needs  . Financial resource strain: Not on file  . Food insecurity:    Worry: Not on file    Inability: Not on file  . Transportation needs:    Medical: Not on file    Non-medical: Not on file  Tobacco Use  . Smoking status: Never Smoker  . Smokeless tobacco: Never Used  Substance and Sexual Activity  . Alcohol use: No    Alcohol/week: 0.0 oz  . Drug use: No  . Sexual activity: Not on file  Lifestyle  . Physical activity:    Days per week: Not on file    Minutes per session: Not on file  . Stress: Not on file  Relationships  . Social connections:    Talks on phone: Not on file    Gets together: Not on file    Attends religious service: Not on file    Active member of club or organization: Not on file    Attends meetings of clubs or organizations: Not on file    Relationship status: Not on file  Other Topics Concern  . Not on file  Social History Narrative  . Not on file    Vitals:   01/14/18 1531  BP: 126/70  Pulse: (!) 57  Resp: 12  Temp: 98.3 F (36.8 C)  SpO2: 99%    Body mass index is 23.71 kg/m.   Physical Exam  Nursing note and vitals reviewed. Constitutional: She is oriented to person, place, and time. She appears well-developed and well-nourished. No distress.  HENT:  Head: Normocephalic and atraumatic.  Mouth/Throat: Oropharynx is clear and moist and mucous membranes are normal.  Eyes: Pupils are equal, round, and reactive to light. Conjunctivae are normal.  Cardiovascular: Regular rhythm. Bradycardia present.  No murmur heard. Pulses:      Dorsalis pedis pulses are 2+ on the right side, and 2+ on the left side.  Respiratory: Effort normal and breath sounds normal. No respiratory distress.  GI: Soft. She exhibits no mass. There is no hepatomegaly. There is no tenderness.  Musculoskeletal: She exhibits no edema.  Lymphadenopathy:    She has no cervical adenopathy.  Neurological: She is alert and oriented to  person, place, and time. She has normal strength. No cranial nerve deficit. Gait normal.  Skin: Skin is warm. No rash noted. No erythema.  Psychiatric: Her mood appears anxious.  Well groomed, good eye contact.      ASSESSMENT AND PLAN:   Ms. Julie Schmidt was seen today for transfer of care.  Orders Placed This Encounter  Procedures  . Lipid panel  . Microalbumin / creatinine urine ratio    Headache, unspecified headache type  Hx does not seem concerning for a serious process. She is following with neurosurgeon in a few weeks. Instructed about warning signs.   Bradycardia  Mild and asymptomatic. Some side effects of BB discussed. For now no changes in Bisoprolol. Instructed to monitor HR and to let me know about numbers in 3-4 weeks. Instructed  about warning signs.   Essential hypertension Adequately controlled. No changes in current management. Continue low salt diet. Eye exam is current. Instructed to monitor BP at home. F/U in 6 months, before if needed.   Hyperlipidemia associated with type 2 diabetes mellitus ([place]) She is not on statin. She is coming tomorrow for fasting labs. Continue low fat diet. Will wait until FLP results are back.  Diabetes mellitus without complication Well controlled. Continue non pharmacologic treatment. Good foot care recommended. Eye exam annually at least. F/U in 6 months.     Jakaiden Fill G. [name], MD  [facility]. [place] [facility].

## 2018-01-14 NOTE — Assessment & Plan Note (Signed)
Well controlled. Continue non pharmacologic treatment. Good foot care recommended. Eye exam annually at least. F/U in 6 months.

## 2018-01-19 ENCOUNTER — Other Ambulatory Visit (INDEPENDENT_AMBULATORY_CARE_PROVIDER_SITE_OTHER): Payer: Medicare Other

## 2018-01-19 DIAGNOSIS — E1169 Type 2 diabetes mellitus with other specified complication: Secondary | ICD-10-CM

## 2018-01-19 DIAGNOSIS — E785 Hyperlipidemia, unspecified: Secondary | ICD-10-CM

## 2018-01-19 DIAGNOSIS — E119 Type 2 diabetes mellitus without complications: Secondary | ICD-10-CM

## 2018-01-19 LAB — LIPID PANEL
Cholesterol: 190 mg/dL (ref 0–200)
HDL: 56.3 mg/dL (ref >39.00)
LDL CALC: 118 mg/dL — AB (ref 0–99)
NONHDL: 133.61
Total CHOL/HDL Ratio: 3
Triglycerides: 79 mg/dL (ref 0.0–149.0)
VLDL: 15.8 mg/dL (ref 0.0–40.0)

## 2018-01-19 LAB — MICROALBUMIN / CREATININE URINE RATIO
Creatinine,U: 143.5 mg/dL
MICROALB UR: 2 mg/dL — AB (ref 0.0–1.9)
Microalb Creat Ratio: 1.4 mg/g (ref 0.0–30.0)

## 2018-01-21 ENCOUNTER — Other Ambulatory Visit: Payer: Self-pay | Admitting: *Deleted

## 2018-01-21 MED ORDER — PRAVASTATIN SODIUM 20 MG PO TABS: 20.0000 mg | ORAL_TABLET | Freq: Every day | ORAL | 3 refills | Status: DC

## 2018-04-03 ENCOUNTER — Encounter: Payer: Self-pay | Admitting: Family Medicine

## 2018-04-03 ENCOUNTER — Ambulatory Visit (INDEPENDENT_AMBULATORY_CARE_PROVIDER_SITE_OTHER): Payer: Medicare Other | Admitting: Family Medicine

## 2018-04-03 VITALS — BP 140/70 | HR 63 | Temp 98.1°F | Resp 16 | Ht 61.0 in | Wt 131.0 lb

## 2018-04-03 DIAGNOSIS — E1169 Type 2 diabetes mellitus with other specified complication: Secondary | ICD-10-CM | POA: Diagnosis not present

## 2018-04-03 DIAGNOSIS — Z7184 Encounter for health counseling related to travel: Secondary | ICD-10-CM

## 2018-04-03 DIAGNOSIS — Z23 Encounter for immunization: Secondary | ICD-10-CM | POA: Diagnosis not present

## 2018-04-03 DIAGNOSIS — I10 Essential (primary) hypertension: Secondary | ICD-10-CM | POA: Diagnosis not present

## 2018-04-03 DIAGNOSIS — M159 Polyosteoarthritis, unspecified: Secondary | ICD-10-CM

## 2018-04-03 DIAGNOSIS — E785 Hyperlipidemia, unspecified: Secondary | ICD-10-CM

## 2018-04-03 MED ORDER — PRAVASTATIN SODIUM 20 MG PO TABS: 20.0000 mg | ORAL_TABLET | Freq: Every day | ORAL | 0 refills | Status: DC

## 2018-04-03 MED ORDER — BISOPROLOL-HYDROCHLOROTHIAZIDE 10-6.25 MG PO TABS: 1.0000 | ORAL_TABLET | Freq: Every day | ORAL | 0 refills | Status: DC

## 2018-04-03 NOTE — Patient Instructions (Addendum)
A few things to remember from today's visit:   Hyperlipidemia associated with type 2 diabetes mellitus ([place])  Essential hypertension  Travel advice encounter - Plan: Hepatitis A vaccine adult IM TravelVaccine Information Vaccines, also called immunizations, can protect you from certain diseases. Vaccines can also prevent the spread of certain infections. It is important to see your health care provider or a travel medicine specialist 4-6 weeks before you travel. This allows time for recommended vaccines to take effect. It also provides enough time for you to get vaccines that must be given in a series over a period of days or weeks. Vaccines for travelers include:  Routine vaccines. These vaccines are standard.  Recommended travel vaccines. These vaccines are generally recommended before international travel.  Geographically required travel vaccines. These vaccines are necessary before travel to some countries or regions.  If it is less than 4 weeks before you leave, you should still see your health care provider. You might still benefit from vaccines or medicines. What are routine vaccines? Routine vaccines are shots that can protect you from common diseases in many parts of the world. Most routine vaccines are given at certain ages starting in childhood. Routine vaccines also include the annual flu (influenza) vaccine. It is important that you are up to date on your routine vaccines before you travel. You may be advised to get extra doses, also called booster vaccines, such as the Tdap (tetanus, diphtheria, and pertussis). What are recommended vaccines? Recommended travel vaccines change over time. Your health care provider can tell you what vaccines are recommended before your trip. The most common recommended vaccines before travel are hepatitis A and typhoid vaccines. Know your travel schedule when you visit your health care provider. The vaccines that are recommended before foreign travel  will depend on several factors, such as:  The country or countries of travel.  Whether you will be traveling to rural areas.  How long you will be traveling.  The season of the year.  Your age. Older adults should get a vaccine against a certain type of pneumonia (pneumococcal) and a vaccine against shingles (herpes zoster).  Your health status.  Your previous vaccines.  The annual influenza vaccine sometimes differs for the [place] and [place] hemispheres. You should:  Get both vaccines if you are traveling to the other hemisphere, and you have a chronic medical condition.  Get the vaccine shortly before or during the flu season, and only if the vaccine in your country differs from the vaccine in your destination country.  Get the other influenza vaccine either before leaving the country or shortly after arriving at the destination country.  What are geographically required vaccines? Children should be up to date with all of the recommended vaccinations. Parents should follow the standard vaccination guidelines that are recommended by the pediatrician. Some vaccines may be required during an ongoing outbreak of an infectious disease in a country or region. Your health care provider will be able to tell you about any outbreaks and required vaccines. Some examples of required vaccines include: Yellow fever vaccine  Proof of yellow fever immunization is currently required for most people before traveling to certain countries in [place] and [place].  If proof of immunization is incomplete or inaccurate, you could be quarantined, denied entry, or given another dose of vaccine at the travel site.  This vaccine can only be obtained at approved centers.  You should get the yellow fever vaccine at least 10 days before your trip.  After  10 days, most people show immunity to yellow fever.  If it has been longer than 10 years since you received the yellow fever vaccine, another dose  is required.  Meningococcal vaccine  Meningococcal immunization may be required prior to travel to parts of [place] and [place].  Proof of meningococcal immunization is required by the [place] for any person older than age 78 who is taking part in [place] or [place].  Visas for traveling to take part in hajj or umrah will not even be issued until there is proof of immunization. You should get this vaccine at least 10 days before your trip.  After 10 days, most people show immunity.  If it has been longer than 3 years since your last immunization, another dose is required.  Some travel circumstances may require additional vaccination with the following vaccines:  Hepatitis B.  Rabies.  Tick-borne encephalitis.  Malaria.  Where to find more information:  Centers for Disease Control and Prevention (CDC): http://www.wolf.info/  World Health Organization [facility]): RoleLink.com.br This information is not intended to replace advice given to you by your health care provider. Make sure you discuss any questions you have with your health care provider. Document Released: 05/15/2009 Document Revised: 02/20/2016 Document Reviewed: 10/31/2015 Elsevier Interactive Patient Education  [name].   Please be sure medication list is accurate. If a new problem present, please set up appointment sooner than planned today.

## 2018-04-03 NOTE — Progress Notes (Signed)
HPI:  Chief Complaint  Patient presents with  . Traveling out of country    medication refills and check up    Julie Schmidt is a 77 y.o. female, who is here today requesting refill on her medications because she is planning on traveling overseas, she will stay in her home country for 4 to 5 months,[place]. She wants to know if she needs any vaccinations I would like to have prophylaxis for malaria.  Hypertension:  Currently on Bisoprolol-HCTZ 10-6.25 mg daily.  She is taking medications as instructed, no side effects reported. She has not noted unusual headache, visual changes, exertional chest pain, dyspnea,  focal weakness, or edema.   Lab Results  Component Value Date   CREATININE 0.79 06/21/2016   BUN 12 06/21/2016   NA 141 06/21/2016   K 4.5 06/21/2016   CL 105 06/21/2016   CO2 28 06/21/2016     DM 2, currently she is on nonpharmacologic treatment.  Lab Results  Component Value Date   HGBA1C 5.6 12/18/2017   Complaining about joint pain, ankles and knees mainly. No edema or erythema. This problem is chronic and seems to be exacerbated by long sitting, like when she travels overseas. She is not taking OTC medication.  No limitation of ROM  Review of Systems  Constitutional: Negative for activity change, appetite change, fatigue and fever.  HENT: Negative for mouth sores, nosebleeds and trouble swallowing.   Eyes: Negative for redness and visual disturbance.  Respiratory: Negative for cough, shortness of breath and wheezing.   Cardiovascular: Negative for chest pain, palpitations and leg swelling.  Gastrointestinal: Negative for abdominal pain, nausea and vomiting.       Negative for changes in bowel habits.  Endocrine: Negative for polydipsia, polyphagia and polyuria.  Genitourinary: Negative for decreased urine volume and hematuria.  Musculoskeletal: Positive for arthralgias. Negative for gait problem and myalgias.  Neurological: Negative  for syncope, weakness and headaches.  Psychiatric/Behavioral: Negative for confusion. The patient is nervous/anxious.       Current Outpatient Medications on File Prior to Visit  Medication Sig Dispense Refill  . aspirin 81 MG tablet Take 81 mg by mouth daily.    . Cholecalciferol (VITAMIN D3) 2000 units TABS Take by mouth daily.    Marland Kitchen ibuprofen (ADVIL,MOTRIN) 200 MG tablet Take 200 mg by mouth every 6 (six) hours as needed.    . polyethylene glycol powder (GLYCOLAX/MIRALAX) powder MIX 255 GRAM PO ONCE FOR 1 DOSE  0   No current facility-administered medications on file prior to visit.      Past Medical History:  Diagnosis Date  . Arthritis    knees, back  . Diabetes mellitus   . Headache   . Hypertension    Allergies  Allergen Reactions  . Norvasc [Amlodipine] Other (See Comments)    Dizziness and Chest pains at night when lies down  . Oxycodone-Acetaminophen Nausea And Vomiting    Social History   Socioeconomic History  . Marital status: Widowed    Spouse name: Not on file  . Number of children: Not on file  . Years of education: Not on file  . Highest education level: Not on file  Occupational History  . Not on file  Social Needs  . Financial resource strain: Not on file  . Food insecurity:    Worry: Not on file    Inability: Not on file  . Transportation needs:    Medical: Not on file  Non-medical: Not on file  Tobacco Use  . Smoking status: Never Smoker  . Smokeless tobacco: Never Used  Substance and Sexual Activity  . Alcohol use: No    Alcohol/week: 0.0 standard drinks  . Drug use: No  . Sexual activity: Not on file  Lifestyle  . Physical activity:    Days per week: Not on file    Minutes per session: Not on file  . Stress: Not on file  Relationships  . Social connections:    Talks on phone: Not on file    Gets together: Not on file    Attends religious service: Not on file    Active member of club or organization: Not on file    Attends  meetings of clubs or organizations: Not on file    Relationship status: Not on file  Other Topics Concern  . Not on file  Social History Narrative  . Not on file    Vitals:   04/03/18 1038  BP: 140/70  Pulse: 63  Resp: 16  Temp: 98.1 F (36.7 C)  SpO2: 99%   Body mass index is 24.75 kg/m.   Physical Exam  Nursing note and vitals reviewed. Constitutional: She is oriented to person, place, and time. She appears well-developed and well-nourished. No distress.  HENT:  Head: Normocephalic and atraumatic.  Mouth/Throat: Oropharynx is clear and moist and mucous membranes are normal. She has dentures.  Eyes: Pupils are equal, round, and reactive to light. Conjunctivae are normal.  Cardiovascular: Normal rate and regular rhythm.  Murmur (SEM I/VI LUSB) heard. Pulses:      Dorsalis pedis pulses are 2+ on the right side, and 2+ on the left side.  Respiratory: Effort normal and breath sounds normal. No respiratory distress.  GI: Soft. She exhibits no mass. There is no hepatomegaly. There is no tenderness.  Musculoskeletal: She exhibits no edema.  No significant deformity or signs of synovitis.  Lymphadenopathy:    She has no cervical adenopathy.  Neurological: She is alert and oriented to person, place, and time. She has normal strength. No cranial nerve deficit. Gait normal.  Skin: Skin is warm. No rash noted. No erythema.  Psychiatric: She has a normal mood and affect.  Well groomed, good eye contact.    ASSESSMENT AND PLAN:   Julie Schmidt was seen today for traveling out of country.  Diagnoses and all orders for this visit:  Generalized osteoarthritis of multiple sites  Educated about Dx,prognosis,and treatment options. Recommend Acetaminophen 500 mg qid prn.  Hyperlipidemia associated with type 2 diabetes mellitus ([place])  No changes in current management. She will be overseas almost 6 months. Refill sent to her pharmacy. Will planned on repeating FLP next visit.  -      pravastatin (PRAVACHOL) 20 MG tablet; Take 1 tablet (20 mg total) by mouth daily.  Essential hypertension  Otherwise adequately controlled. No changes in current management. DASH-low salt diet recommended. Eye exam recommended annually. F/U in 6 months, before if needed.   -     bisoprolol-hydrochlorothiazide (ZIAC) 10-6.25 MG tablet; Take 1 tablet by mouth daily. -     Basic metabolic panel; Future  Travel advice encounter  General safety recommendations given including a safe source of water and food. Mosquito bite prevention.  -     Hepatitis A vaccine adult IM      Return in about 6 months (around 10/03/2018).      [name]. [name], MD  [facility]. [place] office.

## 2018-04-05 ENCOUNTER — Encounter: Payer: Self-pay | Admitting: Family Medicine

## 2018-06-16 ENCOUNTER — Ambulatory Visit: Payer: Medicare Other | Admitting: Family Medicine

## 2019-06-02 DIAGNOSIS — Z961 Presence of intraocular lens: Secondary | ICD-10-CM | POA: Diagnosis not present

## 2019-06-02 DIAGNOSIS — H401113 Primary open-angle glaucoma, right eye, severe stage: Secondary | ICD-10-CM | POA: Diagnosis not present

## 2019-06-02 DIAGNOSIS — H401121 Primary open-angle glaucoma, left eye, mild stage: Secondary | ICD-10-CM | POA: Diagnosis not present

## 2019-06-02 DIAGNOSIS — E119 Type 2 diabetes mellitus without complications: Secondary | ICD-10-CM | POA: Diagnosis not present

## 2019-06-02 LAB — HM DIABETES EYE EXAM

## 2019-06-08 ENCOUNTER — Telehealth: Payer: Self-pay | Admitting: Family Medicine

## 2019-06-08 ENCOUNTER — Other Ambulatory Visit: Payer: Self-pay

## 2019-06-08 ENCOUNTER — Ambulatory Visit (INDEPENDENT_AMBULATORY_CARE_PROVIDER_SITE_OTHER): Payer: Medicare Other | Admitting: Family Medicine

## 2019-06-08 ENCOUNTER — Encounter: Payer: Self-pay | Admitting: Family Medicine

## 2019-06-08 ENCOUNTER — Encounter: Payer: Medicare Other | Admitting: Family Medicine

## 2019-06-08 VITALS — BP 124/70 | HR 67 | Temp 96.1°F | Ht 61.0 in | Wt 127.0 lb

## 2019-06-08 DIAGNOSIS — Z23 Encounter for immunization: Secondary | ICD-10-CM | POA: Diagnosis not present

## 2019-06-08 DIAGNOSIS — Z780 Asymptomatic menopausal state: Secondary | ICD-10-CM

## 2019-06-08 DIAGNOSIS — I10 Essential (primary) hypertension: Secondary | ICD-10-CM

## 2019-06-08 DIAGNOSIS — E785 Hyperlipidemia, unspecified: Secondary | ICD-10-CM

## 2019-06-08 DIAGNOSIS — E1169 Type 2 diabetes mellitus with other specified complication: Secondary | ICD-10-CM

## 2019-06-08 DIAGNOSIS — I499 Cardiac arrhythmia, unspecified: Secondary | ICD-10-CM | POA: Diagnosis not present

## 2019-06-08 DIAGNOSIS — Z0000 Encounter for general adult medical examination without abnormal findings: Secondary | ICD-10-CM

## 2019-06-08 LAB — COMPREHENSIVE METABOLIC PANEL
ALT: 28 U/L (ref 0–35)
AST: 20 U/L (ref 0–37)
Albumin: 4.6 g/dL (ref 3.5–5.2)
Alkaline Phosphatase: 124 U/L — ABNORMAL HIGH (ref 39–117)
BUN: 13 mg/dL (ref 6–23)
CO2: 31 mEq/L (ref 19–32)
Calcium: 9.8 mg/dL (ref 8.4–10.5)
Chloride: 103 mEq/L (ref 96–112)
Creatinine, Ser: 0.74 mg/dL (ref 0.40–1.20)
GFR: 92.26 mL/min (ref >60.00)
Glucose, Bld: 93 mg/dL (ref 70–99)
Potassium: 4.6 mEq/L (ref 3.5–5.1)
Sodium: 139 mEq/L (ref 135–145)
Total Bilirubin: 0.5 mg/dL (ref 0.2–1.2)
Total Protein: 7.5 g/dL (ref 6.0–8.3)

## 2019-06-08 LAB — LIPID PANEL
Cholesterol: 215 mg/dL — ABNORMAL HIGH (ref 0–200)
HDL: 47.2 mg/dL (ref >39.00)
LDL Cholesterol: 147 mg/dL — ABNORMAL HIGH (ref 0–99)
NonHDL: 167.86
Total CHOL/HDL Ratio: 5
Triglycerides: 106 mg/dL (ref 0.0–149.0)
VLDL: 21.2 mg/dL (ref 0.0–40.0)

## 2019-06-08 LAB — MICROALBUMIN / CREATININE URINE RATIO
Creatinine,U: 122.4 mg/dL
Microalb Creat Ratio: 1.8 mg/g (ref 0.0–30.0)
Microalb, Ur: 2.2 mg/dL — ABNORMAL HIGH (ref 0.0–1.9)

## 2019-06-08 LAB — HEMOGLOBIN A1C: Hgb A1c MFr Bld: 6.5 % (ref 4.6–6.5)

## 2019-06-08 MED ORDER — PRAVASTATIN SODIUM 20 MG PO TABS: 20.0000 mg | ORAL_TABLET | Freq: Every day | ORAL | 2 refills | Status: DC
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
## 2019-06-08 NOTE — Telephone Encounter (Signed)
Called pt and daughter several times no answer. Per Dr. [name] verbally, pt has not been taking any Bp medication and her Bp was good so pt does not need Bp meds. They will wait for pt lab results to come back in before Rx for cholesterol will be called in.

## 2019-06-08 NOTE — Telephone Encounter (Signed)
Pt daughter Lorre Nick called and stated that pt should have two RX sent over from Physical today 06/07/29. Pt daughter is at pharmacy now and no medications have been sent. pt daughter would like a call back from the nurse regarding. Pt would like to use the pharmacy [facility], [place], [place] [address]. Please advise

## 2019-06-08 NOTE — Telephone Encounter (Signed)
Spoke to [place] and gave recommendations per Dr. [name] concerning medication.

## 2019-06-08 NOTE — Patient Instructions (Addendum)
A few things to remember from today's visit:   Routine general medical examination at a health care facility  Need for influenza vaccination - Plan: Flu Vaccine QUAD High Dose(Fluad)  Hyperlipidemia associated with type 2 diabetes mellitus ([place]) - Plan: Comprehensive metabolic panel, Lipid panel, DEXAScan  Irregular heart rate - Plan: EKG 12-Lead  Diabetes mellitus without complication ([place]) - Plan: DEXAScan, Microalbumin / creatinine urine ratio  Essential hypertension - Plan: Hemoglobin A1c, DEXAScan  Asymptomatic postmenopausal estrogen deficiency - Plan: DEXAScan  A few tips:  -As we age balance is not as good as it was, so there is a higher risks for falls. Please remove small rugs and furniture that is "in your way" and could increase the risk of falls. Stretching exercises may help with fall prevention: Yoga and Tai Chi are some examples. Low impact exercise is better, so you are not very achy the next day.  -Sun screen and avoidance of direct sun light recommended. Caution with dehydration, if working outdoors be sure to drink enough fluids.  - Some medications are not safe as we age, increases the risk of side effects and can potentially interact with other medication you are also taken;  including some of over the counter medications. Be sure to let me know when you start a new medication even if it is a dietary/vitamin supplement.   -Healthy diet low in red meet/animal fat and sugar + regular physical activity is recommended.      Please be sure medication list is accurate. If a new problem present, please set up appointment sooner than planned today.

## 2019-06-09 NOTE — Telephone Encounter (Signed)
I have tried contacting pt x 2 to go over her lab results, unable to leave a voicemail due to mailbox not being set up; crm has been created.

## 2019-12-07 ENCOUNTER — Other Ambulatory Visit: Payer: Self-pay

## 2019-12-07 ENCOUNTER — Encounter: Payer: Self-pay | Admitting: Family Medicine

## 2019-12-07 ENCOUNTER — Ambulatory Visit (INDEPENDENT_AMBULATORY_CARE_PROVIDER_SITE_OTHER): Payer: Medicare Other | Admitting: Family Medicine

## 2019-12-07 VITALS — BP 136/78 | HR 79 | Temp 96.8°F | Resp 16 | Ht 61.0 in | Wt 136.0 lb

## 2019-12-07 DIAGNOSIS — G63 Polyneuropathy in diseases classified elsewhere: Secondary | ICD-10-CM

## 2019-12-07 DIAGNOSIS — E1149 Type 2 diabetes mellitus with other diabetic neurological complication: Secondary | ICD-10-CM | POA: Diagnosis not present

## 2019-12-07 DIAGNOSIS — I10 Essential (primary) hypertension: Secondary | ICD-10-CM | POA: Diagnosis not present

## 2019-12-07 DIAGNOSIS — K219 Gastro-esophageal reflux disease without esophagitis: Secondary | ICD-10-CM | POA: Diagnosis not present

## 2019-12-07 DIAGNOSIS — D329 Benign neoplasm of meninges, unspecified: Secondary | ICD-10-CM

## 2019-12-07 DIAGNOSIS — R748 Abnormal levels of other serum enzymes: Secondary | ICD-10-CM | POA: Diagnosis not present

## 2019-12-07 LAB — HEMOGLOBIN A1C: Hgb A1c MFr Bld: 6.6 % — ABNORMAL HIGH (ref 4.6–6.5)

## 2019-12-07 LAB — COMPREHENSIVE METABOLIC PANEL
ALT: 11 U/L (ref 0–35)
AST: 18 U/L (ref 0–37)
Albumin: 4.5 g/dL (ref 3.5–5.2)
Alkaline Phosphatase: 97 U/L (ref 39–117)
BUN: 14 mg/dL (ref 6–23)
CO2: 28 mEq/L (ref 19–32)
Calcium: 9.7 mg/dL (ref 8.4–10.5)
Chloride: 105 mEq/L (ref 96–112)
Creatinine, Ser: 0.85 mg/dL (ref 0.40–1.20)
GFR: 78.52 mL/min (ref >60.00)
Glucose, Bld: 115 mg/dL — ABNORMAL HIGH (ref 70–99)
Potassium: 4.1 mEq/L (ref 3.5–5.1)
Sodium: 140 mEq/L (ref 135–145)
Total Bilirubin: 0.4 mg/dL (ref 0.2–1.2)
Total Protein: 7.1 g/dL (ref 6.0–8.3)

## 2019-12-07 LAB — MICROALBUMIN / CREATININE URINE RATIO
Creatinine,U: 159.8 mg/dL
Microalb Creat Ratio: 0.8 mg/g (ref 0.0–30.0)
Microalb, Ur: 1.3 mg/dL (ref 0.0–1.9)

## 2019-12-07 LAB — GAMMA GT: GGT: 33 U/L (ref 7–51)

## 2019-12-07 MED ORDER — GABAPENTIN 100 MG PO CAPS: 100.0000 mg | ORAL_CAPSULE | Freq: Every day | ORAL | 3 refills | Status: DC

## 2019-12-07 MED ORDER — PANTOPRAZOLE SODIUM 40 MG PO TBEC: 40.0000 mg | DELAYED_RELEASE_TABLET | Freq: Every day | ORAL | 3 refills | Status: DC

## 2019-12-07 MED ORDER — FREESTYLE LIBRE SENSOR SYSTEM MISC: 1.0000 | Freq: Three times a day (TID) | 2 refills | Status: DC | PRN

## 2019-12-07 NOTE — Assessment & Plan Note (Addendum)
BP adequately controlled. Continue non pharmacologic treatment. Continue low salt diet. Monitor BP at home. Eye exam is current.

## 2019-12-07 NOTE — Progress Notes (Signed)
HPI: Julie Schmidt is a 79 y.o. female, who is here today with her daughter for 6 months follow up.   She was last seen on 06/08/19.  Elevated alk phosphatase. Negative for high alcohol intake. She has not noted abdominal pain,N/V,or jaundice.  Lab Results  Component Value Date   ALT 28 06/08/2019   AST 20 06/08/2019   ALKPHOS 124 (H) 06/08/2019   BILITOT 0.5 06/08/2019   HTN: She is not checking BP at home. On non pharmacologic treatment. Negative for severe/frequent headache, visual changes, chest pain, dyspnea, palpitation, focal weakness, or edema.  Lab Results  Component Value Date   CREATININE 0.74 06/08/2019   BUN 13 06/08/2019   NA 139 06/08/2019   K 4.6 06/08/2019   CL 103 06/08/2019   CO2 31 06/08/2019   DM II: Dx;ed in 2003.  Lab Results  Component Value Date   MICROALBUR 2.2 (H) 06/08/2019   MICROALBUR 2.0 (H) 01/19/2018   She is not checking BS's. Negative for polydipsia,polyuria, or polyphagia. She is on non pharmacologic treatment.  Lab Results  Component Value Date   HGBA1C 6.5 06/08/2019   Today she is c/o severe burning like sensation in feet. She has had problem for "a while",years. Stable. Day and night. It interferes with sleep.  Omeprazole 20 mg OTC has not helped. Negative for ulcers or skin lesions.  Heartburn and burning mid chest sensation and night when she is in bed. She has not tried OTC medications. She has not noted exacerbating or alleviating factors.  Meningioma s/p craniectomy 4 years ago. She follows with neurosurgeon annually.  Review of Systems  Constitutional: Positive for fatigue. Negative for activity change, appetite change and fever.  HENT: Negative for mouth sores, nosebleeds and sore throat.   Respiratory: Negative for cough and wheezing.   Gastrointestinal:       Negative for changes in bowel habits.  Genitourinary: Negative for decreased urine volume, dysuria and hematuria.  Musculoskeletal:  Positive for arthralgias. Negative for gait problem.  Neurological: Negative for seizures, syncope, facial asymmetry and weakness.  Psychiatric/Behavioral: Positive for sleep disturbance. Negative for confusion. The patient is nervous/anxious.   Rest of ROS, see pertinent positives sand negatives in HPI  Current Outpatient Medications on File Prior to Visit  Medication Sig Dispense Refill  . aspirin 81 MG tablet Take 81 mg by mouth daily.    . Cholecalciferol (VITAMIN D3) 2000 units TABS Take by mouth daily.    Marland Kitchen ibuprofen (ADVIL,MOTRIN) 200 MG tablet Take 200 mg by mouth every 6 (six) hours as needed.    . polyethylene glycol powder (GLYCOLAX/MIRALAX) powder MIX 255 GRAM PO ONCE FOR 1 DOSE  0  . pravastatin (PRAVACHOL) 20 MG tablet Take 1 tablet (20 mg total) by mouth daily. 90 tablet 2   No current facility-administered medications on file prior to visit.    Past Medical History:  Diagnosis Date  . Arthritis    knees, back  . Diabetes mellitus   . Headache   . Hypertension    Allergies  Allergen Reactions  . Norvasc [Amlodipine] Other (See Comments)    Dizziness and Chest pains at night when lies down  . Oxycodone-Acetaminophen Nausea And Vomiting    Social History   Socioeconomic History  . Marital status: Widowed    Spouse name: Not on file  . Number of children: Not on file  . Years of education: Not on file  . Highest education level: Not on file  Occupational  History  . Not on file  Tobacco Use  . Smoking status: Never Smoker  . Smokeless tobacco: Never Used  Substance and Sexual Activity  . Alcohol use: No    Alcohol/week: 0.0 standard drinks  . Drug use: No  . Sexual activity: Not on file  Other Topics Concern  . Not on file  Social History Narrative  . Not on file   Social Determinants of Health   Financial Resource Strain:   . Difficulty of Paying Living Expenses:   Food Insecurity:   . Worried About Charity fundraiser in the Last Year:   . Arts development officer in the Last Year:   Transportation Needs:   . Film/video editor (Medical):   Marland Kitchen Lack of Transportation (Non-Medical):   Physical Activity:   . Days of Exercise per Week:   . Minutes of Exercise per Session:   Stress:   . Feeling of Stress :   Social Connections:   . Frequency of Communication with Friends and Family:   . Frequency of Social Gatherings with Friends and Family:   . Attends Religious Services:   . Active Member of Clubs or Organizations:   . Attends Archivist Meetings:   Marland Kitchen Marital Status:     Vitals:   12/07/19 1355  BP: 136/78  Pulse: 79  Resp: 16  Temp: (!) 96.8 F (36 C)  SpO2: 98%   Body mass index is 25.7 kg/m.   Physical Exam Vitals and nursing note reviewed.  Constitutional:      General: She is not in acute distress.    Appearance: She is well-developed.  HENT:     Head: Normocephalic and atraumatic.     Mouth/Throat:     Mouth: Mucous membranes are moist.     Dentition: Has dentures.     Pharynx: Oropharynx is clear.  Eyes:     Conjunctiva/sclera: Conjunctivae normal.     Pupils: Pupils are equal, round, and reactive to light.  Cardiovascular:     Rate and Rhythm: Normal rate and regular rhythm.     Pulses:          Dorsalis pedis pulses are 2+ on the right side and 2+ on the left side.     Heart sounds: No murmur heard.   Pulmonary:     Effort: Pulmonary effort is normal. No respiratory distress.     Breath sounds: Normal breath sounds.  Abdominal:     Palpations: Abdomen is soft. There is no hepatomegaly or mass.     Tenderness: There is no abdominal tenderness.  Lymphadenopathy:     Cervical: No cervical adenopathy.  Skin:    General: Skin is warm.     Findings: No erythema or rash.  Neurological:     General: No focal deficit present.     Mental Status: She is alert and oriented to person, place, and time.     Cranial Nerves: No cranial nerve deficit.     Gait: Gait normal.  Psychiatric:      Comments: Well groomed, good eye contact.    ASSESSMENT AND PLAN:   Julie Schmidt was seen today for 6 months follow-up.  Orders Placed This Encounter  Procedures  . Comprehensive metabolic panel  . Hemoglobin A1c  . Microalbumin / creatinine urine ratio  . Gamma GT   Lab Results  Component Value Date   ALT 11 12/07/2019   AST 18 12/07/2019   ALKPHOS 97 12/07/2019  BILITOT 0.4 12/07/2019   Lab Results  Component Value Date   CREATININE 0.85 12/07/2019   BUN 14 12/07/2019   NA 140 12/07/2019   K 4.1 12/07/2019   CL 105 12/07/2019   CO2 28 12/07/2019   Lab Results  Component Value Date   HGBA1C 6.6 (H) 12/07/2019   Lab Results  Component Value Date   MICROALBUR 1.3 12/07/2019   MICROALBUR 2.2 (H) 06/08/2019   Polyneuropathy associated with underlying disease ([place]) Adequate foot care discussed. She agrees with trying Gabapentin 100 mg at bedtime,some side effects discussed.  Type 2 diabetes mellitus with neurological complications ([place]) [id number] at goal. Continue non pharmacologic treatment. Regular exercise and healthy diet with avoidance of added sugar food intake is an important part of treatment and recommended. Annual eye exam, periodic dental and foot care recommended. F/U in 5-6 months   Essential hypertension BP adequately controlled. Continue non pharmacologic treatment. Continue low salt diet. Monitor BP at home. Eye exam is current.  Meningioma S/P craniotomy. Following annually with neuro surgeon.  GERD (gastroesophageal reflux disease) GERD precautions discussed. Stop Omeprazole. Start Protonix 40 mg daily.  Elevated alkaline phosphatase level Mild. Possible etiologies discussed. Further recommendations according to lab results.   Return in about 6 months (around 06/07/2020).   Jonai Weyland G. [name], MD  [facility] [place] office. A few things to remember from today's visit:  Today Protonix 40 mg added to take 30  min before breakfast.  Gabapentin 100 mg at bedtime for burning sensation. GERD: Avoid foods that make your symptoms worse, for example coffee, chocolate,pepermeint,alcohol, and greasy food. Raising the head of your bed about 6 inches may help with nocturnal symptoms. Avoid tobacco use. Avoid lying down for 3 hours after eating.  Instead 3 large meals daily try small and more frequent meals during the day.   You should be evaluated immediately if bloody vomiting, bloody stools, black stools (like tar), difficulty swallowing, food gets stuck on the way down or choking when eating. Abnormal weight loss or severe abdominal pain.  If symptoms are not resolved sometimes endoscopy is necessary.  If you need refills please call your pharmacy. Do not use My Chart to request refills or for acute issues that need immediate attention.    Please be sure medication list is accurate. If a new problem present, please set up appointment sooner than planned today.

## 2019-12-07 NOTE — Assessment & Plan Note (Signed)
S/P craniotomy. Following annually with neuro surgeon.

## 2019-12-07 NOTE — Patient Instructions (Signed)
A few things to remember from today's visit:   Essential hypertension  Hyperlipidemia associated with type 2 diabetes mellitus ([place]), Chronic - Plan: Comprehensive metabolic panel  Meningioma ([place]), Chronic  Type 2 diabetes mellitus with neurological complications ([place]) - Plan: Hemoglobin A1c, Microalbumin / creatinine urine ratio, gabapentin (NEURONTIN) 100 MG capsule  Gastroesophageal reflux disease without esophagitis - Plan: pantoprazole (PROTONIX) 40 MG tablet  Peripheral Neuropathy Peripheral neuropathy is a type of nerve damage. It affects nerves that carry signals between the spinal cord and the arms, legs, and the rest of the body (peripheral nerves). It does not affect nerves in the spinal cord or brain. In peripheral neuropathy, one nerve or a group of nerves may be damaged. Peripheral neuropathy is a broad category that includes many specific nerve disorders, like diabetic neuropathy, hereditary neuropathy, and carpal tunnel syndrome. What are the causes? This condition may be caused by:  Diabetes. This is the most common cause of peripheral neuropathy.  Nerve injury.  Pressure or stress on a nerve that lasts a long time.  Lack (deficiency) of B vitamins. This can result from alcoholism, poor diet, or a restricted diet.  Infections.  Autoimmune diseases, such as rheumatoid arthritis and systemic lupus erythematosus.  Nerve diseases that are passed from parent to child (inherited).  Some medicines, such as cancer medicines (chemotherapy).  Poisonous (toxic) substances, such as lead and mercury.  Too little blood flowing to the legs.  Kidney disease.  Thyroid disease. In some cases, the cause of this condition is not known. What are the signs or symptoms? Symptoms of this condition depend on which of your nerves is damaged. Common symptoms include:  Loss of feeling (numbness) in the feet, hands, or both.  Tingling in the feet, hands, or both.  Burning  pain.  Very sensitive skin.  Weakness.  Not being able to move a part of the body (paralysis).  Muscle twitching.  Clumsiness or poor coordination.  Loss of balance.  Not being able to control your bladder.  Feeling dizzy.  Sexual problems. How is this diagnosed? Diagnosing and finding the cause of peripheral neuropathy can be difficult. Your health care provider will take your medical history and do a physical exam. A neurological exam will also be done. This involves checking things that are affected by your brain, spinal cord, and nerves (nervous system). For example, your health care provider will check your reflexes, how you move, and what you can feel. You may have other tests, such as:  Blood tests.  Electromyogram (EMG) and nerve conduction tests. These tests check nerve function and how well the nerves are controlling the muscles.  Imaging tests, such as CT scans or MRI to rule out other causes of your symptoms.  Removing a small piece of nerve to be examined in a lab (nerve biopsy). This is rare.  Removing and examining a small amount of the fluid that surrounds the brain and spinal cord (lumbar puncture). This is rare. How is this treated? Treatment for this condition may involve:  Treating the underlying cause of the neuropathy, such as diabetes, kidney disease, or vitamin deficiencies.  Stopping medicines that can cause neuropathy, such as chemotherapy.  Medicine to relieve pain. Medicines may include: ? Prescription or over-the-counter pain medicine. ? Antiseizure medicine. ? Antidepressants. ? Pain-relieving patches that are applied to painful areas of skin.  Surgery to relieve pressure on a nerve or to destroy a nerve that is causing pain.  Physical therapy to help improve movement  and balance.  Devices to help you move around (assistive devices). Follow these instructions at home: Medicines  Take over-the-counter and prescription medicines only as  told by your health care provider. Do not take any other medicines without first asking your health care provider.  Do not drive or use heavy machinery while taking prescription pain medicine. Lifestyle   Do not use any products that contain nicotine or tobacco, such as cigarettes and e-cigarettes. Smoking keeps blood from reaching damaged nerves. If you need help quitting, ask your health care provider.  Avoid or limit alcohol. Too much alcohol can cause a vitamin B deficiency, and vitamin B is needed for healthy nerves.  Eat a healthy diet. This includes: ? Eating foods that are high in fiber, such as fresh fruits and vegetables, whole grains, and beans. ? Limiting foods that are high in fat and processed sugars, such as fried or sweet foods. General instructions   If you have diabetes, work closely with your health care provider to keep your blood sugar under control.  If you have numbness in your feet: ? Check every day for signs of injury or infection. Watch for redness, warmth, and swelling. ? Wear padded socks and comfortable shoes. These help protect your feet.  Develop a good support system. Living with peripheral neuropathy can be stressful. Consider talking with a mental health specialist or joining a support group.  Use assistive devices and attend physical therapy as told by your health care provider. This may include using a walker or a cane.  Keep all follow-up visits as told by your health care provider. This is important. Contact a health care provider if:  You have new signs or symptoms of peripheral neuropathy.  You are struggling emotionally from dealing with peripheral neuropathy.  Your pain is not well-controlled. Get help right away if:  You have an injury or infection that is not healing normally.  You develop new weakness in an arm or leg.  You fall frequently. Summary  Peripheral neuropathy is when the nerves in the arms, or legs are damaged,  resulting in numbness, weakness, or pain.  There are many causes of peripheral neuropathy, including diabetes, pinched nerves, vitamin deficiencies, autoimmune disease, and hereditary conditions.  Diagnosing and finding the cause of peripheral neuropathy can be difficult. Your health care provider will take your medical history, do a physical exam, and do tests, including blood tests and nerve function tests.  Treatment involves treating the underlying cause of the neuropathy and taking medicines to help control pain. Physical therapy and assistive devices may also help. This information is not intended to replace advice given to you by your health care provider. Make sure you discuss any questions you have with your health care provider. Document Revised: 05/09/2017 Document Reviewed: 08/05/2016 Elsevier Patient Education  [place].  Today Protonix 40 mg added to take 30 min before breakfast.  Gabapentin 100 mg at bedtime for burning sensation. GERD: Avoid foods that make your symptoms worse, for example coffee, chocolate,pepermeint,alcohol, and greasy food. Raising the head of your bed about 6 inches may help with nocturnal symptoms.  Avoid tobacco use. Avoid lying down for 3 hours after eating.  Instead 3 large meals daily try small and more frequent meals during the day.   You should be evaluated immediately if bloody vomiting, bloody stools, black stools (like tar), difficulty swallowing, food gets stuck on the way down or choking when eating. Abnormal weight loss or severe abdominal pain.  If  symptoms are not resolved sometimes endoscopy is necessary.  If you need refills please call your pharmacy. Do not use My Chart to request refills or for acute issues that need immediate attention.    Please be sure medication list is accurate. If a new problem present, please set up appointment sooner than planned today.

## 2019-12-07 NOTE — Assessment & Plan Note (Signed)
GERD precautions discussed. Stop Omeprazole. Start Protonix 40 mg daily.

## 2019-12-07 NOTE — Assessment & Plan Note (Signed)
HgA1C at goal. Continue non pharmacologic treatment. Regular exercise and healthy diet with avoidance of added sugar food intake is an important part of treatment and recommended. Annual eye exam, periodic dental and foot care recommended. F/U in 5-6 months. 

## 2019-12-09 ENCOUNTER — Encounter: Payer: Self-pay | Admitting: Family Medicine

## 2019-12-10 ENCOUNTER — Other Ambulatory Visit: Payer: Self-pay

## 2019-12-10 MED ORDER — DEXCOM G6 TRANSMITTER MISC: 3 refills | Status: DC

## 2019-12-10 MED ORDER — DEXCOM G6 SENSOR MISC: 12 refills | Status: DC

## 2019-12-10 MED ORDER — DEXCOM G6 RECEIVER DEVI: 1 refills | Status: AC

## 2019-12-15 ENCOUNTER — Telehealth: Payer: Self-pay | Admitting: Family Medicine

## 2019-12-15 MED ORDER — FREESTYLE LITE TEST VI STRP
ORAL_STRIP | 12 refills | Status: DC
Start: 2019-12-15 — End: 2019-12-20

## 2019-12-15 MED ORDER — FREESTYLE LANCETS MISC
12 refills | Status: DC
Start: 2019-12-15 — End: 2019-12-20

## 2019-12-15 MED ORDER — FREESTYLE LITE DEVI: 0 refills | Status: DC

## 2019-12-15 NOTE — Telephone Encounter (Signed)
Pt called back stating that the insurance will cover Free Style,One Touch and Relia   Pharm: [facility] on [address].

## 2019-12-15 NOTE — Telephone Encounter (Signed)
Noted, will wait for call back from patient.

## 2019-12-15 NOTE — Telephone Encounter (Signed)
Rx's sent in for Freestyle.

## 2019-12-15 NOTE — Telephone Encounter (Signed)
Pt is calling in from the pharmacy stating that the glucose meter and kit is not being covered by the insurance and she will call her insurance company and give [place] a call back to let us know which meter is being covered by them.

## 2019-12-20 ENCOUNTER — Other Ambulatory Visit: Payer: Self-pay

## 2019-12-20 MED ORDER — FREESTYLE LITE DEVI: 0 refills | Status: DC

## 2019-12-20 MED ORDER — FREESTYLE LITE TEST VI STRP: ORAL_STRIP | 12 refills | Status: DC

## 2019-12-20 MED ORDER — FREESTYLE LANCETS MISC: 12 refills | Status: DC

## 2020-01-04 ENCOUNTER — Telehealth: Payer: Self-pay

## 2020-01-04 NOTE — Telephone Encounter (Signed)
Received a phone call from Arville Lime, NP with [facility]. They did a visit with pt today and they do a PAD quantiflow test. Pt's came back with moderate decrease of circulation in both her lower extremities. They just wanted you aware so you can discuss it with her in detail at her next office visit.

## 2020-01-11 ENCOUNTER — Other Ambulatory Visit: Payer: Self-pay | Admitting: Family Medicine

## 2020-01-11 DIAGNOSIS — I739 Peripheral vascular disease, unspecified: Secondary | ICD-10-CM

## 2020-01-11 NOTE — Telephone Encounter (Signed)
I placed ABI order to evaluate severity of circulation problem. Thanks, BJ

## 2020-01-12 NOTE — Telephone Encounter (Signed)
I left patient a voicemail letting her know that the ultrasound was ordered & she will be contacted to schedule it.

## 2020-02-01 ENCOUNTER — Encounter: Payer: Self-pay | Admitting: Family Medicine

## 2020-02-01 ENCOUNTER — Ambulatory Visit (INDEPENDENT_AMBULATORY_CARE_PROVIDER_SITE_OTHER): Payer: Medicare Other | Admitting: Family Medicine

## 2020-02-01 ENCOUNTER — Other Ambulatory Visit: Payer: Self-pay

## 2020-02-01 VITALS — BP 140/89 | HR 59 | Temp 98.1°F | Wt 134.6 lb

## 2020-02-01 DIAGNOSIS — R1011 Right upper quadrant pain: Secondary | ICD-10-CM | POA: Diagnosis not present

## 2020-02-01 NOTE — Patient Instructions (Signed)
Abdominal Pain, Adult Pain in the abdomen (abdominal pain) can be caused by many things. Often, abdominal pain is not serious and it gets better with no treatment or by being treated at home. However, sometimes abdominal pain is serious. Your health care provider will ask questions about your medical history and do a physical exam to try to determine the cause of your abdominal pain. Follow these instructions at home:  Medicines  Take over-the-counter and prescription medicines only as told by your health care provider.  Do not take a laxative unless told by your health care provider. General instructions  Watch your condition for any changes.  Drink enough fluid to keep your urine pale yellow.  Keep all follow-up visits as told by your health care provider. This is important. Contact a health care provider if:  Your abdominal pain changes or gets worse.  You are not hungry or you lose weight without trying.  You are constipated or have diarrhea for more than 2-3 days.  You have pain when you urinate or have a bowel movement.  Your abdominal pain wakes you up at night.  Your pain gets worse with meals, after eating, or with certain foods.  You are vomiting and cannot keep anything down.  You have a fever.  You have blood in your urine. Get help right away if:  Your pain does not go away as soon as your health care provider told you to expect.  You cannot stop vomiting.  Your pain is only in areas of the abdomen, such as the right side or the left lower portion of the abdomen. Pain on the right side could be caused by appendicitis.  You have bloody or black stools, or stools that look like tar.  You have severe pain, cramping, or bloating in your abdomen.  You have signs of dehydration, such as: ? Dark urine, very little urine, or no urine. ? Cracked lips. ? Dry mouth. ? Sunken eyes. ? Sleepiness. ? Weakness.  You have trouble breathing or chest  pain. Summary  Often, abdominal pain is not serious and it gets better with no treatment or by being treated at home. However, sometimes abdominal pain is serious.  Watch your condition for any changes.  Take over-the-counter and prescription medicines only as told by your health care provider.  Contact a health care provider if your abdominal pain changes or gets worse.  Get help right away if you have severe pain, cramping, or bloating in your abdomen. This information is not intended to replace advice given to you by your health care provider. Make sure you discuss any questions you have with your health care provider. Document Revised: 10/05/2018 Document Reviewed: 10/05/2018 Elsevier Patient Education  2020 [employer]  

## 2020-02-01 NOTE — Progress Notes (Signed)
Established Patient Office Visit  Subjective:  Patient ID: Julie Schmidt, female    DOB: 19-Jan-1941  Age: 79 y.o. MRN: [id number]  CC:  Chief Complaint  Patient presents with  . Abdominal Pain    RUQ and right side x1 week     HPI [name] presents for right upper quadrant abdominal pain intermittently for the past week.  She has some difficulty describing quality but this is moderate intensity.  No associated nausea or vomiting.  No clear relation to eating.  No known injury.  Denies any cough or fever.  Appetite is fair.  No reported weight loss.  She has had previous appendectomy.  No alleviating factors.  Denies history of similar problem.  No associated rash. Bowel movements unchanged.  She tends to have bowel movement out every 3 days and occasionally has some straining  Her chronic problems include history of hypertension, GERD, intermittent constipation, type 2 diabetes.  Past Medical History:  Diagnosis Date  . Arthritis    knees, back  . Diabetes mellitus   . Headache   . Hypertension     Past Surgical History:  Procedure Laterality Date  . ABDOMINAL HYSTERECTOMY    . APPENDECTOMY    . BRAIN SURGERY  05/19/2014   Craniotomy for meningioma  . CARDIAC CATHETERIZATION N/A 11/25/2014   Procedure: Left Heart Cath and Coronary Angiography;  Surgeon: Jerline Pain, MD;  Location: [facility];  Service: Cardiovascular;  Laterality: N/A;  . CESAREAN SECTION    . CRANIOTOMY Left 05/19/2014   Procedure: CRANIOTOMY TUMOR EXCISION with Curve;  Surgeon: Erline Levine, MD;  Location: [facility];  Service: Neurosurgery;  Laterality: Left;  Left frontal craniotomy for meningioma with brain lab    Family History  Problem Relation Age of Onset  . Cancer Father     Social History   Socioeconomic History  . Marital status: Widowed    Spouse name: Not on file  . Number of children: Not on file  . Years of education: Not on file  . Highest education level: Not  on file  Occupational History  . Not on file  Tobacco Use  . Smoking status: Never Smoker  . Smokeless tobacco: Never Used  Substance and Sexual Activity  . Alcohol use: No    Alcohol/week: 0.0 standard drinks  . Drug use: No  . Sexual activity: Not on file  Other Topics Concern  . Not on file  Social History Narrative  . Not on file   Social Determinants of Health   Financial Resource Strain:   . Difficulty of Paying Living Expenses: Not on file  Food Insecurity:   . Worried About Charity fundraiser in the Last Year: Not on file  . Ran Out of Food in the Last Year: Not on file  Transportation Needs:   . Lack of Transportation (Medical): Not on file  . Lack of Transportation (Non-Medical): Not on file  Physical Activity:   . Days of Exercise per Week: Not on file  . Minutes of Exercise per Session: Not on file  Stress:   . Feeling of Stress : Not on file  Social Connections:   . Frequency of Communication with Friends and Family: Not on file  . Frequency of Social Gatherings with Friends and Family: Not on file  . Attends Religious Services: Not on file  . Active Member of Clubs or Organizations: Not on file  . Attends Archivist Meetings:  Not on file  . Marital Status: Not on file  Intimate Partner Violence:   . Fear of Current or Ex-Partner: Not on file  . Emotionally Abused: Not on file  . Physically Abused: Not on file  . Sexually Abused: Not on file    Outpatient Medications Prior to Visit  Medication Sig Dispense Refill  . aspirin 81 MG tablet Take 81 mg by mouth daily.    . Blood Glucose Monitoring Suppl (FREESTYLE LITE) DEVI Use to test blood sugars up to 3 times daily. 1 each 0  . Cholecalciferol (VITAMIN D3) 2000 units TABS Take by mouth daily.    . Continuous Blood Gluc Receiver (DEXCOM G6 RECEIVER) DEVI Use to check blood sugars up to 3 times daily. 1 each 1  . Continuous Blood Gluc Sensor (DEXCOM G6 SENSOR) MISC Use to check blood sugars up  to 3 times daily. 1 each 12  . Continuous Blood Gluc Transmit (DEXCOM G6 TRANSMITTER) MISC Use to test blood sugars up to 3 times daily. 1 each 3  . glucose blood (FREESTYLE LITE) test strip Use to test blood sugars up to 3 times daily. 100 each 12  . ibuprofen (ADVIL,MOTRIN) 200 MG tablet Take 200 mg by mouth every 6 (six) hours as needed.    . Lancets (FREESTYLE) lancets Use to test blood sugars up to 3 times daily. 100 each 12  . pantoprazole (PROTONIX) 40 MG tablet Take 1 tablet (40 mg total) by mouth daily. 30 tablet 3  . polyethylene glycol powder (GLYCOLAX/MIRALAX) powder MIX 255 GRAM PO ONCE FOR 1 DOSE  0  . pravastatin (PRAVACHOL) 20 MG tablet Take 1 tablet (20 mg total) by mouth daily. 90 tablet 2  . gabapentin (NEURONTIN) 100 MG capsule Take 1 capsule (100 mg total) by mouth at bedtime. (Patient not taking: Reported on 02/01/2020) 30 capsule 3   No facility-administered medications prior to visit.    Allergies  Allergen Reactions  . Norvasc [Amlodipine] Other (See Comments)    Dizziness and Chest pains at night when lies down  . Oxycodone-Acetaminophen Nausea And Vomiting    ROS Review of Systems  Constitutional: Negative for appetite change, chills, fever and unexpected weight change.  Respiratory: Negative for cough and shortness of breath.   Cardiovascular: Negative for chest pain.  Gastrointestinal: Positive for abdominal pain and constipation. Negative for abdominal distention, blood in stool, diarrhea, nausea and vomiting.  Genitourinary: Negative for dysuria.      Objective:    Physical Exam Vitals reviewed.  Constitutional:      Appearance: She is well-developed.  Cardiovascular:     Rate and Rhythm: Normal rate and regular rhythm.  Pulmonary:     Effort: Pulmonary effort is normal.     Breath sounds: Normal breath sounds.  Abdominal:     Comments: Nondistended.  Normal bowel sounds.  Soft with mild tenderness right upper quadrant to deep palpation.  No  masses palpated.  Neurological:     Mental Status: She is alert.     BP 140/89 (BP Location: Left Arm, Patient Position: Sitting, Cuff Size: Normal)   Pulse (!) 59   Temp 98.1 F (36.7 C) (Oral)   Wt 134 lb 9.6 oz (61.1 kg)   SpO2 98%   BMI 25.43 kg/m  Wt Readings from Last 3 Encounters:  02/01/20 134 lb 9.6 oz (61.1 kg)  12/07/19 136 lb (61.7 kg)  06/08/19 127 lb (57.6 kg)     Health Maintenance Due  Topic Date Due  .  Hepatitis C Screening  Never done  . COVID-19 Vaccine (1) Never done  . DEXA SCAN  Never done  . INFLUENZA VACCINE  01/09/2020    There are no preventive care reminders to display for this patient.  Lab Results  Component Value Date   TSH 1.47 05/07/2016   Lab Results  Component Value Date   WBC 5.8 06/21/2016   HGB 12.3 06/21/2016   HCT 38.0 06/21/2016   MCV 91.6 06/21/2016   PLT 303 06/21/2016   Lab Results  Component Value Date   NA 140 12/07/2019   K 4.1 12/07/2019   CO2 28 12/07/2019   GLUCOSE 115 (H) 12/07/2019   BUN 14 12/07/2019   CREATININE 0.85 12/07/2019   BILITOT 0.4 12/07/2019   ALKPHOS 97 12/07/2019   AST 18 12/07/2019   ALT 11 12/07/2019   PROT 7.1 12/07/2019   ALBUMIN 4.5 12/07/2019   CALCIUM 9.7 12/07/2019   ANIONGAP 8 06/21/2016   GFR 78.52 12/07/2019   Lab Results  Component Value Date   CHOL 215 (H) 06/08/2019   Lab Results  Component Value Date   HDL 47.20 06/08/2019   Lab Results  Component Value Date   LDLCALC 147 (H) 06/08/2019   Lab Results  Component Value Date   TRIG 106.0 06/08/2019   Lab Results  Component Value Date   CHOLHDL 5 06/08/2019   Lab Results  Component Value Date   HGBA1C 6.6 (H) 12/07/2019      Assessment & Plan:   Patient presents with 1 week history of intermittent right upper quadrant abdominal pain.  She does have some mild tenderness over the right upper quadrant.  Nonacute abdomen.  She has not any red flag symptoms such as fever, recurrent vomiting, weight loss.   She does have some intermittent constipation and certainly symptoms could be related to incomplete evacuation of her colon  -Check labs with CBC, hepatic panel, urinalysis, lipase -Check abdominal ultrasound to further assess -Discussed measures to reduce constipation -Follow-up immediately for any fever or worsening abdominal pain  No orders of the defined types were placed in this encounter.   Follow-up: No follow-ups on file.    Carolann Littler, MD

## 2020-02-02 LAB — URINALYSIS
Bilirubin Urine: NEGATIVE
Glucose, UA: NEGATIVE
Hgb urine dipstick: NEGATIVE
Ketones, ur: NEGATIVE
Leukocytes,Ua: NEGATIVE
Nitrite: NEGATIVE
Protein, ur: NEGATIVE
Specific Gravity, Urine: 1.011 (ref 1.001–1.03)
pH: 7 (ref 5.0–8.0)

## 2020-02-02 LAB — HEPATIC FUNCTION PANEL
AG Ratio: 1.7 (calc) (ref 1.0–2.5)
ALT: 14 U/L (ref 6–29)
AST: 18 U/L (ref 10–35)
Albumin: 4.7 g/dL (ref 3.6–5.1)
Alkaline phosphatase (APISO): 106 U/L (ref 37–153)
Bilirubin, Direct: 0.1 mg/dL (ref 0.0–0.2)
Globulin: 2.8 g/dL (calc) (ref 1.9–3.7)
Indirect Bilirubin: 0.3 mg/dL (calc) (ref 0.2–1.2)
Total Bilirubin: 0.4 mg/dL (ref 0.2–1.2)
Total Protein: 7.5 g/dL (ref 6.1–8.1)

## 2020-02-02 LAB — CBC WITH DIFFERENTIAL/PLATELET
Absolute Monocytes: 416 cells/uL (ref 200–950)
Basophils Absolute: 32 cells/uL (ref 0–200)
Basophils Relative: 0.6 %
Eosinophils Absolute: 189 cells/uL (ref 15–500)
Eosinophils Relative: 3.5 %
HCT: 36.1 % (ref 35.0–45.0)
Hemoglobin: 12.1 g/dL (ref 11.7–15.5)
Lymphs Abs: 1701 cells/uL (ref 850–3900)
MCH: 30.1 pg (ref 27.0–33.0)
MCHC: 33.5 g/dL (ref 32.0–36.0)
MCV: 89.8 fL (ref 80.0–100.0)
MPV: 8.8 fL (ref 7.5–12.5)
Monocytes Relative: 7.7 %
Neutro Abs: 3062 cells/uL (ref 1500–7800)
Neutrophils Relative %: 56.7 %
Platelets: 297 10*3/uL (ref 140–400)
RBC: 4.02 10*6/uL (ref 3.80–5.10)
RDW: 12.8 % (ref 11.0–15.0)
Total Lymphocyte: 31.5 %
WBC: 5.4 10*3/uL (ref 3.8–10.8)

## 2020-02-02 LAB — LIPASE: Lipase: 20 U/L (ref 7–60)

## 2020-02-08 ENCOUNTER — Ambulatory Visit
Admission: RE | Admit: 2020-02-08 | Discharge: 2020-02-08 | Disposition: A | Discharge status: Home / Self Care | Payer: Medicare Other | Source: Ambulatory Visit | Attending: Family Medicine | Admitting: Family Medicine

## 2020-02-08 DIAGNOSIS — R1011 Right upper quadrant pain: Secondary | ICD-10-CM

## 2020-02-16 ENCOUNTER — Ambulatory Visit (INDEPENDENT_AMBULATORY_CARE_PROVIDER_SITE_OTHER): Payer: Medicare Other | Admitting: Family Medicine

## 2020-02-16 ENCOUNTER — Encounter: Payer: Self-pay | Admitting: Family Medicine

## 2020-02-16 ENCOUNTER — Other Ambulatory Visit: Payer: Self-pay

## 2020-02-16 VITALS — BP 110/70 | HR 67 | Ht 61.0 in | Wt 139.0 lb

## 2020-02-16 DIAGNOSIS — Z23 Encounter for immunization: Secondary | ICD-10-CM

## 2020-02-16 DIAGNOSIS — E1149 Type 2 diabetes mellitus with other diabetic neurological complication: Secondary | ICD-10-CM

## 2020-02-16 DIAGNOSIS — I499 Cardiac arrhythmia, unspecified: Secondary | ICD-10-CM

## 2020-02-16 DIAGNOSIS — K7689 Other specified diseases of liver: Secondary | ICD-10-CM

## 2020-02-16 DIAGNOSIS — K760 Fatty (change of) liver, not elsewhere classified: Secondary | ICD-10-CM

## 2020-02-16 MED ORDER — ACCU-CHEK AVIVA CONNECT W/DEVICE KIT: 1.0000 | PACK | Freq: Every day | 0 refills | Status: DC

## 2020-02-16 MED ORDER — ACCU-CHEK SOFTCLIX LANCET DEV KIT: 1.0000 | PACK | Freq: Every day | 3 refills | Status: AC

## 2020-02-16 MED ORDER — ACCU-CHEK SOFTCLIX LANCETS MISC: 12 refills | Status: DC

## 2020-02-16 NOTE — Patient Instructions (Addendum)
A few things to remember from today's visit:  Liver cyst  Steatosis of liver  Irregular heart rate - Plan: EKG 12-Lead  Type 2 diabetes mellitus with neurological complications (HCC) - Plan: Accu-Chek Softclix Lancets lancets, Blood Glucose Monitoring Suppl (ACCU-CHEK AVIVA CONNECT) w/Device KIT, Lancets Misc. (ACCU-CHEK SOFTCLIX LANCET DEV) KIT  Fatty Liver Disease  Fatty liver disease occurs when too much fat has built up in your liver cells. Fatty liver disease is also called hepatic steatosis or steatohepatitis. The liver removes harmful substances from your bloodstream and produces fluids that your body needs. It also helps your body use and store energy from the food you eat. In many cases, fatty liver disease does not cause symptoms or problems. It is often diagnosed when tests are being done for other reasons. However, over time, fatty liver can cause inflammation that may lead to more serious liver problems, such as scarring of the liver (cirrhosis) and liver failure. Fatty liver is associated with insulin resistance, increased body fat, high blood pressure (hypertension), and high cholesterol. These are features of metabolic syndrome and increase your risk for stroke, diabetes, and heart disease. What are the causes? This condition may be caused by:  Drinking too much alcohol.  Poor nutrition.  Obesity.  Cushing's syndrome.  Diabetes.  High cholesterol.  Certain drugs.  Poisons.  Some viral infections.  Pregnancy. What increases the risk? You are more likely to develop this condition if you:  Abuse alcohol.  Are overweight.  Have diabetes.  Have hepatitis.  Have a high triglyceride level.  Are pregnant. What are the signs or symptoms? Fatty liver disease often does not cause symptoms. If symptoms do develop, they can include:  Fatigue.  Weakness.  Weight loss.  Confusion.  Abdominal pain.  Nausea and vomiting.  Yellowing of your skin and the  white parts of your eyes (jaundice).  Itchy skin. How is this diagnosed? This condition may be diagnosed by:  A physical exam and medical history.  Blood tests.  Imaging tests, such as an ultrasound, CT scan, or MRI.  A liver biopsy. A small sample of liver tissue is removed using a needle. The sample is then looked at under a microscope. How is this treated? Fatty liver disease is often caused by other health conditions. Treatment for fatty liver may involve medicines and lifestyle changes to manage conditions such as:  Alcoholism.  High cholesterol.  Diabetes.  Being overweight or obese. Follow these instructions at home:   Do not drink alcohol. If you have trouble quitting, ask your health care provider how to safely quit with the help of medicine or a supervised program. This is important to keep your condition from getting worse.  Eat a healthy diet as told by your health care provider. Ask your health care provider about working with a diet and nutrition specialist (dietitian) to develop an eating plan.  Exercise regularly. This can help you lose weight and control your cholesterol and diabetes. Talk to your health care provider about an exercise plan and which activities are best for you.  Take over-the-counter and prescription medicines only as told by your health care provider.  Keep all follow-up visits as told by your health care provider. This is important. Contact a health care provider if: You have trouble controlling your:  Blood sugar. This is especially important if you have diabetes.  Cholesterol.  Drinking of alcohol. Get help right away if:  You have abdominal pain.  You have jaundice.  You  have nausea and vomiting.  You vomit blood or material that looks like coffee grounds.  You have stools that are black, tar-like, or bloody. Summary  Fatty liver disease develops when too much fat builds up in the cells of your liver.  Fatty liver  disease often causes no symptoms or problems. However, over time, fatty liver can cause inflammation that may lead to more serious liver problems, such as scarring of the liver (cirrhosis).  You are more likely to develop this condition if you abuse alcohol, are pregnant, are overweight, have diabetes, have hepatitis, or have high triglyceride levels.  Contact your health care provider if you have trouble controlling your weight, blood sugar, cholesterol, or drinking of alcohol. This information is not intended to replace advice given to you by your health care provider. Make sure you discuss any questions you have with your health care provider. Document Revised: 05/09/2017 Document Reviewed: 03/05/2017 Elsevier Patient Education  [employer].  If you need refills please call your pharmacy. Do not use My Chart to request refills or for acute issues that need immediate attention.    Please be sure medication list is accurate. If a new problem present, please set up appointment sooner than planned today.

## 2020-02-16 NOTE — Progress Notes (Addendum)
Chief Complaint  Patient presents with  . Follow-up   HPI: [name] is a 79 y.o. female, who is here today with her daughter with a few questions about recent RUQ [place] and to follow on recent OV. Her daughter also has several questions about vaccination.  She was evaluated on 02/01/20 because a week of RUQ pain.  No associated symptoms. She cannot describe type of pain. Not radiated. She was not sure about exacerbating or alleviating factors.  Abdominal pain has resolved.  RUQ US done on 02/08/20. 1. Mild to moderate hepatic steatosis. 2. Two simple hepatic cysts.  She and her daughter are very concerned about fatty liver. States that she doe snot eat greasy food and doe snot drink alcohol. She would like to discussed treatment options and prognosis. Risk factors: DM II,overwt, and HLD.  On non pharmacologic treatment.  Lab Results  Component Value Date   HGBA1C 6.6 (H) 12/07/2019   Needs glucometer. Upset because she did not receive Dexcom or Freestyle devises.  CT 2006: Moderate sized hiatal hernia.    3.6 x 4.2 simple cyst anterior aspect right lobe of the liver. Normal kidneys and ureters.  PELVIS CT WITHOUT CONTRAST:  CT scan of the pelvis without contrast shows no free fluid or air. There is a moderate amount of fecal material within the rectum and right colon. No fecal impaction is present.  Distal ureters and bladder appear normal.  The appendix is not definitely identified and the cecum appears to be somewhat mobile. The uterus and ovaries are within normal limits. The bones of the pelvis and lower lumbar spine are within normal limits.  IMPRESSION:  No acute changes CT scan of the pelvis without contrast.   Lab Results  Component Value Date   ALT 14 02/01/2020   AST 18 02/01/2020   ALKPHOS 97 12/07/2019   BILITOT 0.4 02/01/2020   Negative for abnormal wt loss, fever,fatigue,changes in bowel habits,N/V,jaundice, or skin pruritus.  She went to  get her COVID 19 booster vaccine, received a document she needs to check YES or NO. Upset because she could not get vaccine without completing form. Form has questions about HIV,cancer,chemo, and other immunosuppressive condition.  She and her daughter also have questions about pneumonia vaccine and if up to date on health maintenance.   She is planning on going back home and stay for 6-12 month, concerned about meds she is taking.  On examination today irregular. She has had occasional palpitations for a while. It is not related with exertion.  She denies CP,SOB,or diaphoresis. She has had cardiac work up, she doe snot recall doing so. Abnormal EKG and stress test listed on PMHx.  11/17/2014 Gated SPECT myocardia perfusion lexican stress test:  ECG SR, LVH with diffuse repolarization abnormalities.  Arrhythmias during stress: PVCs.  Arrhythmias were not significant.   ECG was uninterpretable.  Stress Findings A pharmacological stress test was performed using IV Lexiscan 0.47m over 10 seconds performed without concurrent submaximal exercise.   734mdose of IV aminophylline given for symptom relief approximately 2 minutes after stress.  Test was converted to pharmacologic due to inability to exercise  The patient reported shortness of breath and headache during the stress test. Abdominal pain   Blood pressure and heart rate demonstrated a normal response to exercise. Heart rate demonstrated normal response to exercise.   11/25/2014 left cardiac cath and coronary angiography:  There is hyperdynamic left ventricular systolic function.  No coronary artery disease  Reassuring cardiac catheterization (false positive nuclear stress test)   Review of Systems  Constitutional: Negative for activity change and appetite change.  HENT: Negative for mouth sores, nosebleeds and sore throat.   Eyes: Negative for redness and visual disturbance.  Respiratory: Negative for cough and wheezing.     Cardiovascular: Negative for leg swelling.  Endocrine: Negative for polydipsia, polyphagia and polyuria.  Genitourinary: Negative for decreased urine volume, dysuria and hematuria.  Musculoskeletal: Negative for gait problem and myalgias.  Neurological: Negative for syncope, weakness and headaches.  Psychiatric/Behavioral: Negative for confusion. The patient is nervous/anxious.   Rest see pertinent positives and negatives per HPI.  Current Outpatient Medications on File Prior to Visit  Medication Sig Dispense Refill  . aspirin 81 MG tablet Take 81 mg by mouth daily.    . Cholecalciferol (VITAMIN D3) 2000 units TABS Take by mouth daily.    . Continuous Blood Gluc Receiver (DEXCOM G6 RECEIVER) DEVI Use to check blood sugars up to 3 times daily. 1 each 1  . gabapentin (NEURONTIN) 100 MG capsule Take 1 capsule (100 mg total) by mouth at bedtime. 30 capsule 3  . ibuprofen (ADVIL,MOTRIN) 200 MG tablet Take 200 mg by mouth every 6 (six) hours as needed.    . pantoprazole (PROTONIX) 40 MG tablet Take 1 tablet (40 mg total) by mouth daily. 30 tablet 3  . polyethylene glycol powder (GLYCOLAX/MIRALAX) powder MIX 255 GRAM PO ONCE FOR 1 DOSE  0  . pravastatin (PRAVACHOL) 20 MG tablet Take 1 tablet (20 mg total) by mouth daily. 90 tablet 2   No current facility-administered medications on file prior to visit.     Past Medical History:  Diagnosis Date  . Arthritis    knees, back  . Diabetes mellitus   . Headache   . Hypertension    Allergies  Allergen Reactions  . Norvasc [Amlodipine] Other (See Comments)    Dizziness and Chest pains at night when lies down  . Oxycodone-Acetaminophen Nausea And Vomiting    Social History   Socioeconomic History  . Marital status: Widowed    Spouse name: Not on file  . Number of children: Not on file  . Years of education: Not on file  . Highest education level: Not on file  Occupational History  . Not on file  Tobacco Use  . Smoking status: Never  Smoker  . Smokeless tobacco: Never Used  Substance and Sexual Activity  . Alcohol use: No    Alcohol/week: 0.0 standard drinks  . Drug use: No  . Sexual activity: Not on file  Other Topics Concern  . Not on file  Social History Narrative  . Not on file   Social Determinants of Health   Financial Resource Strain:   . Difficulty of Paying Living Expenses: Not on file  Food Insecurity:   . Worried About Charity fundraiser in the Last Year: Not on file  . Ran Out of Food in the Last Year: Not on file  Transportation Needs:   . Lack of Transportation (Medical): Not on file  . Lack of Transportation (Non-Medical): Not on file  Physical Activity:   . Days of Exercise per Week: Not on file  . Minutes of Exercise per Session: Not on file  Stress:   . Feeling of Stress : Not on file  Social Connections:   . Frequency of Communication with Friends and Family: Not on file  . Frequency of Social Gatherings with Friends and Family: Not on  file  . Attends Religious Services: Not on file  . Active Member of Clubs or Organizations: Not on file  . Attends Archivist Meetings: Not on file  . Marital Status: Not on file    Vitals:   02/16/20 1421  BP: 110/70  Pulse: 67  SpO2: 97%   Wt Readings from Last 3 Encounters:  02/16/20 139 lb (63 kg)  02/01/20 134 lb 9.6 oz (61.1 kg)  12/07/19 136 lb (61.7 kg)    Body mass index is 26.26 kg/m.  Physical Exam Vitals and nursing note reviewed.  Constitutional:      General: She is not in acute distress.    Appearance: She is well-developed.  HENT:     Head: Normocephalic and atraumatic.     Mouth/Throat:     Mouth: Mucous membranes are moist.     Pharynx: Oropharynx is clear.  Eyes:     Conjunctiva/sclera: Conjunctivae normal.     Pupils: Pupils are equal, round, and reactive to light.  Cardiovascular:     Rate and Rhythm: Normal rate. Rhythm irregular. Occasional extrasystoles are present.    Pulses:          Dorsalis  pedis pulses are 2+ on the right side and 2+ on the left side.     Heart sounds: No murmur heard.   Pulmonary:     Effort: Pulmonary effort is normal. No respiratory distress.     Breath sounds: Normal breath sounds.  Abdominal:     Palpations: Abdomen is soft. There is no hepatomegaly or mass.     Tenderness: There is no abdominal tenderness.  Lymphadenopathy:     Cervical: No cervical adenopathy.  Skin:    General: Skin is warm.     Findings: No erythema or rash.  Neurological:     General: No focal deficit present.     Mental Status: She is alert and oriented to person, place, and time.     Cranial Nerves: No cranial nerve deficit.     Gait: Gait normal.  Psychiatric:        Mood and Affect: Affect normal. Mood is anxious.     Comments: Well groomed, good eye contact.   ASSESSMENT AND PLAN:  [name] was seen today for follow-up.  Diagnoses and all orders for this visit: Orders Placed This Encounter  Procedures  . Flu Vaccine QUAD High Dose(Fluad)  . EKG 12-Lead    Liver cyst Reassured. Problem seems to be present since 2006.  Steatosis of liver Educated about Dx,prognosis,and treatment options. Low fat diet and wt loss recommended. Continue Pravastatin.  Irregular heart rate EKG done today: SR,LAD, LAFB, ? LAE, and a few PVC's. Compared with prior EKG's was difficult because quality/artifacrs. 06/23/19 and 06/2016 not significant differences except for PAC's in 06/2019 and PVC's on this one.  She is asymptomatic, so I do not think more cardiac work up is needed at this time. She and her daughter agree with plan. She was clearly instructed about warning signs.  Type 2 diabetes mellitus with neurological complications ([place]) Continue non pharmacologic treatment. Explained that her health insurance does not cover continue glucose monitoring.  -     Accu-Chek Softclix Lancets lancets; Use as instructed -     Blood Glucose Monitoring Suppl (ACCU-CHEK AVIVA CONNECT)  w/Device KIT; 1 Device by Does not apply route daily. -     Lancets Misc. (ACCU-CHEK SOFTCLIX LANCET DEV) KIT; 1 Device by Does not apply route daily.  Need for  influenza vaccination -     Flu Vaccine QUAD High Dose(Fluad)   Spent 46 minutes with pt and her daughter. During this time hx was obtained and documented, examination performed, past blood work and imaging studies done years ago reviewed and compared with recent US. We discussed plan and reassured  Her vaccines are up to date. Needs AWV and CPE.  Return if symptoms worsen or fail to improve, for Keep next appt.   Jonnatan Hanners G. [name], MD  [facility]. [place] office.   A few things to remember from today's visit:  Liver cyst  Steatosis of liver  Irregular heart rate - Plan: EKG 12-Lead  Type 2 diabetes mellitus with neurological complications (HCC) - Plan: Accu-Chek Softclix Lancets lancets, Blood Glucose Monitoring Suppl (ACCU-CHEK AVIVA CONNECT) w/Device KIT, Lancets Misc. (ACCU-CHEK SOFTCLIX LANCET DEV) KIT  Fatty Liver Disease  Fatty liver disease occurs when too much fat has built up in your liver cells. Fatty liver disease is also called hepatic steatosis or steatohepatitis. The liver removes harmful substances from your bloodstream and produces fluids that your body needs. It also helps your body use and store energy from the food you eat. In many cases, fatty liver disease does not cause symptoms or problems. It is often diagnosed when tests are being done for other reasons. However, over time, fatty liver can cause inflammation that may lead to more serious liver problems, such as scarring of the liver (cirrhosis) and liver failure. Fatty liver is associated with insulin resistance, increased body fat, high blood pressure (hypertension), and high cholesterol. These are features of metabolic syndrome and increase your risk for stroke, diabetes, and heart disease. What are the causes? This condition may be  caused by:  Drinking too much alcohol.  Poor nutrition.  Obesity.  Cushing's syndrome.  Diabetes.  High cholesterol.  Certain drugs.  Poisons.  Some viral infections.  Pregnancy. What increases the risk? You are more likely to develop this condition if you:  Abuse alcohol.  Are overweight.  Have diabetes.  Have hepatitis.  Have a high triglyceride level.  Are pregnant. What are the signs or symptoms? Fatty liver disease often does not cause symptoms. If symptoms do develop, they can include:  Fatigue.  Weakness.  Weight loss.  Confusion.  Abdominal pain.  Nausea and vomiting.  Yellowing of your skin and the white parts of your eyes (jaundice).  Itchy skin. How is this diagnosed? This condition may be diagnosed by:  A physical exam and medical history.  Blood tests.  Imaging tests, such as an ultrasound, CT scan, or MRI.  A liver biopsy. A small sample of liver tissue is removed using a needle. The sample is then looked at under a microscope. How is this treated? Fatty liver disease is often caused by other health conditions. Treatment for fatty liver may involve medicines and lifestyle changes to manage conditions such as:  Alcoholism.  High cholesterol.  Diabetes.  Being overweight or obese. Follow these instructions at home:   Do not drink alcohol. If you have trouble quitting, ask your health care provider how to safely quit with the help of medicine or a supervised program. This is important to keep your condition from getting worse.  Eat a healthy diet as told by your health care provider. Ask your health care provider about working with a diet and nutrition specialist (dietitian) to develop an eating plan.  Exercise regularly. This can help you lose weight and control your cholesterol and diabetes.  Talk to your health care provider about an exercise plan and which activities are best for you.  Take over-the-counter and  prescription medicines only as told by your health care provider.  Keep all follow-up visits as told by your health care provider. This is important. Contact a health care provider if: You have trouble controlling your:  Blood sugar. This is especially important if you have diabetes.  Cholesterol.  Drinking of alcohol. Get help right away if:  You have abdominal pain.  You have jaundice.  You have nausea and vomiting.  You vomit blood or material that looks like coffee grounds.  You have stools that are black, tar-like, or bloody. Summary  Fatty liver disease develops when too much fat builds up in the cells of your liver.  Fatty liver disease often causes no symptoms or problems. However, over time, fatty liver can cause inflammation that may lead to more serious liver problems, such as scarring of the liver (cirrhosis).  You are more likely to develop this condition if you abuse alcohol, are pregnant, are overweight, have diabetes, have hepatitis, or have high triglyceride levels.  Contact your health care provider if you have trouble controlling your weight, blood sugar, cholesterol, or drinking of alcohol. This information is not intended to replace advice given to you by your health care provider. Make sure you discuss any questions you have with your health care provider. Document Revised: 05/09/2017 Document Reviewed: 03/05/2017 Elsevier Patient Education  [employer].  If you need refills please call your pharmacy. Do not use My Chart to request refills or for acute issues that need immediate attention.    Please be sure medication list is accurate. If a new problem present, please set up appointment sooner than planned today.

## 2020-02-17 ENCOUNTER — Ambulatory Visit (HOSPITAL_COMMUNITY)
Admission: RE | Admit: 2020-02-17 | Discharge: 2020-02-17 | Disposition: A | Discharge status: Home / Self Care | Payer: Medicare Other | Source: Ambulatory Visit | Attending: Family Medicine | Admitting: Family Medicine

## 2020-02-17 DIAGNOSIS — I739 Peripheral vascular disease, unspecified: Secondary | ICD-10-CM | POA: Diagnosis not present

## 2020-02-29 ENCOUNTER — Telehealth: Payer: Self-pay | Admitting: Family Medicine

## 2020-02-29 NOTE — Progress Notes (Signed)
°  Chronic Care Management   Note  02/29/2020 Name: Julie Schmidt MRN: [id number] DOB: 1940/08/04  Julie Schmidt is a 79 y.o. year old female who is a primary care patient of [name], Malka So, MD. I reached out to Clementeen Graham by phone today in response to a referral sent by Julie Schmidt's PCP, [name], [name], MD.   Julie Schmidt was given information about Chronic Care Management services today including:  1. CCM service includes personalized support from designated clinical staff supervised by her physician, including individualized plan of care and coordination with other care providers 2. 24/7 contact phone numbers for assistance for urgent and routine care needs. 3. Service will only be billed when office clinical staff spend 20 minutes or more in a month to coordinate care. 4. Only one practitioner may furnish and bill the service in a calendar month. 5. The patient may stop CCM services at any time (effective at the end of the month) by phone call to the office staff.   Patient agreed to services and verbal consent obtained.   Follow up plan:   Carley Perdue [employer] Scheduler

## 2020-03-07 ENCOUNTER — Telehealth: Payer: Self-pay | Admitting: Pharmacist

## 2020-03-07 NOTE — Chronic Care Management (AMB) (Signed)
° ° °  Chronic Care Management Pharmacy Assistant   Name: Julie Schmidt  MRN: [id number] DOB: 10/12/1940  Reason for Encounter: Medication Review/Initial Questions for Pharmacist visit on 03/08/2020.   Patient Questions: 1. Have you seen any other providers since your last visit? No 2. Any changes in your medications or health? No 3. Any side effects from any medications? Yes, her stats she was taking her Gabapentin. She states that she did not like the way it made her feel. 4. Do you have any symptoms or problems not managed by your medications? No 5. Any concerns about your health right now? no 6. Has your provider asked that you check blood pressure, blood sugar, or follow a special diet at home?  She was asked to check her blood sugar at home, but she was not able to get the testing supplies she need. 7. Do you get any type of exercise regularly? Yes. She likes walking when the weather is nice. 8. Can you think of a goal you would like to reach for your health?  No 9. Do you have any problems getting your medications? No 10. Is there anything that you would like to discuss during the appointment?  No  PCP : [name], [name], MD Allergies:   Allergies  Allergen Reactions   Norvasc [Amlodipine] Other (See Comments)    Dizziness and Chest pains at night when lies down   Oxycodone-Acetaminophen Nausea And Vomiting    Medications: Outpatient Encounter Medications as of 03/07/2020  Medication Sig   Accu-Chek Softclix Lancets lancets Use as instructed   aspirin 81 MG tablet Take 81 mg by mouth daily.   Blood Glucose Monitoring Suppl (ACCU-CHEK AVIVA CONNECT) w/Device KIT 1 Device by Does not apply route daily.   Cholecalciferol (VITAMIN D3) 2000 units TABS Take by mouth daily.   Continuous Blood Gluc Receiver (DEXCOM G6 RECEIVER) DEVI Use to check blood sugars up to 3 times daily.   gabapentin (NEURONTIN) 100 MG capsule Take 1 capsule (100 mg total) by mouth at bedtime.    ibuprofen (ADVIL,MOTRIN) 200 MG tablet Take 200 mg by mouth every 6 (six) hours as needed.   Lancets Misc. (ACCU-CHEK SOFTCLIX LANCET DEV) KIT 1 Device by Does not apply route daily.   pantoprazole (PROTONIX) 40 MG tablet Take 1 tablet (40 mg total) by mouth daily.   polyethylene glycol powder (GLYCOLAX/MIRALAX) powder MIX 255 GRAM PO ONCE FOR 1 DOSE   pravastatin (PRAVACHOL) 20 MG tablet Take 1 tablet (20 mg total) by mouth daily.   No facility-administered encounter medications on file as of 03/07/2020.    Current Diagnosis: Patient Active Problem List   Diagnosis Date Noted   GERD (gastroesophageal reflux disease) 12/07/2019   Generalized osteoarthritis of multiple sites 04/03/2018   Hyperlipidemia associated with type 2 diabetes mellitus ([place]) 01/14/2018   Essential hypertension 05/07/2016   Abnormal nuclear stress test 11/25/2014   Abnormal EKG 11/02/2014   Prolonged Q-T interval on ECG 11/02/2014   Diet-controlled diabetes mellitus ([place]) 63/84/6659   Helicobacter pylori gastritis 09/18/2014   Type 2 diabetes mellitus with neurological complications ([place]) 93/57/0177   Meningioma ([place]) 05/19/2014   Back pain, chronic 10/25/2011    Goals Addressed   None     Follow-Up:  Pharmacist Review  Maia Breslow, [place] Assistant [phone]

## 2020-03-08 ENCOUNTER — Other Ambulatory Visit: Payer: Self-pay

## 2020-03-08 ENCOUNTER — Ambulatory Visit: Payer: Medicare Other | Admitting: Pharmacist

## 2020-03-08 ENCOUNTER — Encounter: Payer: Self-pay | Admitting: Family Medicine

## 2020-03-08 ENCOUNTER — Ambulatory Visit (INDEPENDENT_AMBULATORY_CARE_PROVIDER_SITE_OTHER): Payer: Medicare Other | Admitting: Family Medicine

## 2020-03-08 VITALS — BP 124/70 | HR 71 | Resp 16 | Ht 61.0 in | Wt 137.0 lb

## 2020-03-08 DIAGNOSIS — I10 Essential (primary) hypertension: Secondary | ICD-10-CM

## 2020-03-08 DIAGNOSIS — E785 Hyperlipidemia, unspecified: Secondary | ICD-10-CM | POA: Diagnosis not present

## 2020-03-08 DIAGNOSIS — G63 Polyneuropathy in diseases classified elsewhere: Secondary | ICD-10-CM

## 2020-03-08 DIAGNOSIS — K219 Gastro-esophageal reflux disease without esophagitis: Secondary | ICD-10-CM | POA: Diagnosis not present

## 2020-03-08 DIAGNOSIS — E1149 Type 2 diabetes mellitus with other diabetic neurological complication: Secondary | ICD-10-CM

## 2020-03-08 DIAGNOSIS — E1169 Type 2 diabetes mellitus with other specified complication: Secondary | ICD-10-CM

## 2020-03-08 DIAGNOSIS — Z1159 Encounter for screening for other viral diseases: Secondary | ICD-10-CM

## 2020-03-08 MED ORDER — GABAPENTIN 300 MG PO CAPS: 300.0000 mg | ORAL_CAPSULE | Freq: Every day | ORAL | 0 refills | Status: DC

## 2020-03-08 MED ORDER — ACCU-CHEK AVIVA CONNECT W/DEVICE KIT: 1.0000 | PACK | Freq: Every day | 0 refills | Status: AC

## 2020-03-08 MED ORDER — PANTOPRAZOLE SODIUM 40 MG PO TBEC: 40.0000 mg | DELAYED_RELEASE_TABLET | Freq: Every day | ORAL | 1 refills | Status: DC

## 2020-03-08 MED ORDER — ONETOUCH ULTRA 2 W/DEVICE KIT: PACK | 0 refills | Status: DC

## 2020-03-08 NOTE — Chronic Care Management (AMB) (Signed)
Chronic Care Management Pharmacy  Name: Julie Schmidt  MRN: [id number] DOB: [birth year]  Initial Planning Appointment: completed 03/07/20  Initial Questions: 1. Have you seen any other providers since your last visit? n/a 2. Any changes in your medicines or health? No   Chief Complaint/ HPI  Julie Schmidt,  79 y.o. , female presents for their Initial CCM visit with the clinical pharmacist via telephone due to COVID-19 Pandemic.  PCP : [name], [name], MD  Their chronic conditions include: HTN, HLD, GERD, constipation, nerve pain, ASCVD prevention  Office Visits: -02/16/20 Betty [name], MD - Patient presented for follow up with questions about recent ultrasound results. Ultrasound showed two simple hepatic cysts. Patient is also upset because she did not receive Dexcom or Freestyle devices. Flu vaccine was given and EKG ordered. Follow up in 2 weeks.  -02/01/20 Carolann Littler, MD - Patient presented with right upper quadrant abdominal pain. All labs ordered were WNL.Pt denies fever, vomiting or weight loss but does have intermittent constipation.  -12/07/19 Betty [name], MD - Patient presented for chronic conditions follow up. A1c slightly increased to 6.6 from 6.5. Reports burning sensation in her feet and it interferes with sleep. Prescribed gabapentin. Patient reports heartburn and burning mid chest sensation without relief from omeprazole 20 mg OTC. Prescribed pantoprazole.   Medications: Outpatient Encounter Medications as of 03/08/2020  Medication Sig  . aspirin 81 MG tablet Take 81 mg by mouth daily.  . Cholecalciferol (VITAMIN D3) 2000 units TABS Take by mouth daily.  Marland Kitchen ibuprofen (ADVIL,MOTRIN) 200 MG tablet Take 200 mg by mouth every 6 (six) hours as needed.  . pravastatin (PRAVACHOL) 20 MG tablet Take 1 tablet (20 mg total) by mouth daily.  . [DISCONTINUED] pantoprazole (PROTONIX) 40 MG tablet Take 1 tablet (40 mg total) by mouth daily.  . Accu-Chek Softclix Lancets lancets  Use as instructed  . Continuous Blood Gluc Receiver (DEXCOM G6 RECEIVER) DEVI Use to check blood sugars up to 3 times daily.  . Lancets Misc. (ACCU-CHEK SOFTCLIX LANCET DEV) KIT 1 Device by Does not apply route daily.  . polyethylene glycol powder (GLYCOLAX/MIRALAX) powder MIX 255 GRAM PO ONCE FOR 1 DOSE  . [DISCONTINUED] Blood Glucose Monitoring Suppl (ACCU-CHEK AVIVA CONNECT) w/Device KIT 1 Device by Does not apply route daily.  . [DISCONTINUED] gabapentin (NEURONTIN) 100 MG capsule Take 1 capsule (100 mg total) by mouth at bedtime. (Patient not taking: Reported on 03/08/2020)   No facility-administered encounter medications on file as of 03/08/2020.   I met and spoke with [name] last week during her visit with her daughter [name]. She too is planning on going back to [place] for 3-6 months as that is where they are originally from and don't have a place of their own in the [place].  Patient and her daughter live in someone else's apartment and do not enjoy it. Patient has had interrupted sleep due to roommate who is loud during the night and sometimes wakes up at 2am due to the noise. She does report taking naps during the day sometimes.   Patient currently denies any recent abdominal pain.   Current Diagnosis/Assessment:  Goals Addressed            This Visit's Progress   . Pharmacy Care Plan       CARE PLAN ENTRY (see longitudinal plan of care for additional care plan information)  Current Barriers:  . Chronic Disease Management support, education, and care coordination needs related to Hypertension, Hyperlipidemia, Diabetes,  GERD, and nerve pain, and heart event prevention   Hypertension BP Readings from Last 3 Encounters:  03/08/20 124/70  02/16/20 110/70  02/01/20 140/89   . Pharmacist Clinical Goal(s): o Over the next 90 days, patient will work with PharmD and providers to maintain BP goal <130/80 . Current regimen:  o No medications . Interventions: o We discussed  checking your blood pressure at home if you feel dizzy/lightheaded . Patient self care activities - Over the next 90 days, patient will: o Check blood pressure when you feel dizzy or lightheaded document, and provide at future appointments o Ensure daily salt intake < 2300 mg/day  Hyperlipidemia Lab Results  Component Value Date/Time   LDLCALC 92 03/08/2020 04:02 PM   . Pharmacist Clinical Goal(s): o Over the next 90 days, patient will work with PharmD and providers to maintain LDL goal < 100 . Current regimen:  o Pravastatin 20 mg 1 tablet daily . Interventions: o We discussed increasing fiber in your diet to 10-25 g per day to help lower cholesterol o We discussed the importance of getting regular exercise to help lower cholesterol . Patient self care activities - Over the next 90 days, patient will: o Continue current medications  Diabetes Lab Results  Component Value Date/Time   HGBA1C 6.3 (H) 03/08/2020 04:02 PM   HGBA1C 6.6 (H) 12/07/2019 02:39 PM   . Pharmacist Clinical Goal(s): o Over the next 90 days, patient will work with PharmD and providers to maintain A1c goal <7% . Current regimen:  o No medications  . Interventions: o We discussed getting testing supplies to check your blood sugars if you are feeling shaky, sweaty, hungry or dizzy o We discussed eating more green leafy vegetables and less carbs at meals with the healthy plate method and provided handout . Patient self care activities - Over the next 90 days, patient will: o Check blood sugar document, and provide at future appointments o Contact provider with any episodes of hypoglycemia  GERD . Pharmacist Clinical Goal(s) o Over the next 90 days, patient will work with PharmD and providers to minimize symptoms of heartburn . Current regimen:  o Pantoprazole 40 mg 1 tablet daily . Interventions: o We discussed other ways to manage heartburn such as avoiding eating 2 hours before bedtime, elevating the head  of your bed, and avoiding spicy or fatty foods that may cause heartburn . Patient self care activities - Over the next 90 days, patient will: o Continue current medications   Prevention of heart events . Pharmacist Clinical Goal(s) o Over the next 90 days, patient will work with PharmD and providers to prevent heart events . Current regimen:  o Aspirin 81 mg 1 tablet at bedtime . Patient self care activities - Over the next 90 days, patient will: o Monitor for signs of bleeding (unexpected bruising, blood in urine or stool, nosebleeds)  Nerve Pain . Pharmacist Clinical Goal(s) o Over the next 90 days, patient will work with PharmD and providers to minimize symptoms of nerve pain . Current regimen:  o Gabapentin 300 mg 1 tablet at bedtime . Patient self care activities - Over the next 90 days, patient will: o Continue taking medications as prescribed  Medication management . Pharmacist Clinical Goal(s): o Over the next 90 days, patient will work with PharmD and providers to maintain optimal medication adherence . Current pharmacy: [facility] . Interventions o Comprehensive medication review performed. o Continue current medication management strategy . Patient self care activities - Over the  next 90 days, patient will: o Take medications as prescribed o Report any questions or concerns to PharmD and/or provider(s)  Initial goal documentation       Diabetes   A1c goal <7%  Recent Relevant Labs: Lab Results  Component Value Date/Time   HGBA1C 6.3 (H) 03/08/2020 04:02 PM   HGBA1C 6.6 (H) 12/07/2019 02:39 PM   GFR 78.52 12/07/2019 02:39 PM   GFR 92.26 06/08/2019 11:20 AM   MICROALBUR 1.3 12/07/2019 02:39 PM   MICROALBUR 2.2 (H) 06/08/2019 11:33 AM    Last diabetic Eye exam:  Lab Results  Component Value Date/Time   HMDIABEYEEXA No Retinopathy 07/02/2016 12:00 AM    Last diabetic Foot exam: No results found for: HMDIABFOOTEX   Checking BG: Never - patient has been  unable to get testing supplies from the pharmacy  Patient has failed these meds in past: none Patient is currently controlled on the following medications: . No medications  We discussed: diet and exercise extensively and testing when you have symptoms of low blood sugars -Diet: patient reports she sweetens with splenda and doesn't drink soda or sweet tea; she reports eating little pasta, sometimes will have potatoes (once a month) and bread (eats 1 crossiant a day) -Discussed the healthy plate model and provided the handout for patient to get ideas for meal planning -Exercise: discussed the benefits of exercise and aiming for 150 mins/week of moderate intensity   Plan  Continue current medications  GERD   Patient has failed these meds in past:  Patient is currently controlled on the following medications:  . Pantoprazole 40 mg 1 tablet daily  We discussed:  Non-pharmacologic management (avoid eating 2 hours before bedtime, elevating head of bed, avoiding spicy/fatty foods)  Plan  Continue current medications  Hyperlipidemia   LDL goal < 100  Lipid Panel     Component Value Date/Time   CHOL 158 03/08/2020 1602   TRIG 145 03/08/2020 1602   HDL 41 (L) 03/08/2020 1602   LDLCALC 92 03/08/2020 1602    Hepatic Function Latest Ref Rng & Units 02/01/2020 12/07/2019 06/08/2019  Total Protein 6.1 - 8.1 g/dL 7.5 7.1 7.5  Albumin 3.5 - 5.2 g/dL - 4.5 4.6  AST 10 - 35 U/L '18 18 20  ' ALT 6 - 29 U/L '14 11 28  ' Alk Phosphatase 39 - 117 U/L - 97 124(H)  Total Bilirubin 0.2 - 1.2 mg/dL 0.4 0.4 0.5  Bilirubin, Direct 0.0 - 0.2 mg/dL 0.1 - -     The 10-year ASCVD risk score Mikey Bussing DC Jr., et al., 2013) is: 28.2%   Values used to calculate the score:     Age: 48 years     Sex: Female     Is Non-Hispanic African American: Yes     Diabetic: Yes     Tobacco smoker: No     Systolic Blood Pressure: 952 mmHg     Is BP treated: No     HDL Cholesterol: 41 mg/dL     Total Cholesterol: 158  mg/dL   Patient has failed these meds in past: none Patient is currently controlled on the following medications:  . Pravastatin 20 mg 1 tablet daily  We discussed:  diet and exercise extensively  -Diet: increasing fiber in her diet to 10-25 g/day in order to help lower cholesterol -Exercise: discussed the benefits of exercise on cholesterol and recommended to start with walking   Plan Recommend repeat lipid panel and increasing statin intensity based on results.  Continue current medications  Hypertension   BP goal is:  <130/80  Office blood pressures are  BP Readings from Last 3 Encounters:  02/16/20 110/70  02/01/20 140/89  12/07/19 136/78   Patient checks BP at home never Patient home BP readings are ranging: n/a  Patient has failed these meds in the past:  Patient is currently controlled on the following medications:  . No medications  We discussed the importance of monitoring blood pressure at home if feeling dizzy/lightheaded  Plan Denies dizziness/lightheadednezz and denies headaches. Continue control with diet and exercise   Constipation   Patient is currently controlled on the following medications:  Marland Kitchen Milk of magnesia as needed  We discussed:   -Drinking lots of water/fluids to help prevent and treat constipation -Incorporating more soluble fiber in her diet such as oatmeal, green leafy vegetables, apples, etc. -Recommended trying an over the counter stool softener before trying milk of magnesia as patient reports it sometimes gives her diarrhea  Plan  Continue current medications   Nerve pain   Patient has failed these meds in past: none Patient is currently uncontrolled on the following medications:  . Gabapentin 100 mg 1 capsule at bedtime - stopped taking  We discussed:  Talking to Dr. [name] during office visit today to discuss increasing the dose  Plan Discuss with Dr. [name] about increasing dose for better management of pain. Continue  current medications   ASCVD prevention   Patient has failed these meds in past: none Patient is currently controlled on the following medications:  . Aspirin 81 mg 1 tablet daily  We discussed:  Monitoring for signs/symptoms of bleeding or unusual bruising  Plan  Continue current medications    Miscellaneous  Patient is currently on the following medications:  . Ibuprofen 200 mg 1 tablet as needed (takes about once a week)  . Vitamin D3 2000 units daily  We discussed:  Bone density scan that was ordered and patient never completed  Plan Patient needs a bone density scan.  Continue current medications    Vaccines   Reviewed and discussed patient's vaccination history.    Immunization History  Administered Date(s) Administered  . Fluad Quad(high Dose 65+) 06/08/2019, 02/16/2020  . Hepatitis A, Adult 04/03/2018  . Influenza, High Dose Seasonal PF 04/23/2016  . Influenza-Unspecified 03/29/2014  . Pneumococcal Conjugate-13 01/30/2015, 03/13/2018  . Pneumococcal Polysaccharide-23 06/07/2016    Plan  Recommended patient receive Shingrix at pharmacy.   Medication Management   Pt uses [facility] for all medications Uses pill box? No  We discussed: Discussed benefits of medication synchronization, packaging and delivery as well as enhanced pharmacist oversight with [facility].  Plan  Continue current medication management strategy Plan to onboard to [facility] at next office visit.   Follow up: 4 month phone visit Putting on the calendar for 4 months but if patient does not answer, will plan to push out to 6 months as they will be out of the country.  Jeni Salles, PharmD Clinical Pharmacist [facility] [place]

## 2020-03-08 NOTE — Patient Instructions (Addendum)
A few things to remember from today's visit:   Essential hypertension  Gastroesophageal reflux disease without esophagitis - Plan: pantoprazole (PROTONIX) 40 MG tablet  Hyperlipidemia associated with type 2 diabetes mellitus ([place]) - Plan: Lipid panel  Type 2 diabetes mellitus with neurological complications ([place]) - Plan: Hemoglobin A1c  Encounter for HCV screening test for low risk patient - Plan: Hepatitis C antibody  If you need refills please call your pharmacy. Do not use My Chart to request refills or for acute issues that need immediate attention.   Gabapentin increased to 300 mg to take daily at bedtime. If medication does not help after 4-5 weeks, you can discontinue it.  If acid reflux symptoms improved, you can try Protonix (pantoprazole) every other day.  Please be sure medication list is accurate. If a new problem present, please set up appointment sooner than planned today.

## 2020-03-08 NOTE — Progress Notes (Signed)
HPI: Julie Schmidt is a 79 y.o. female, who is here today with her daughter for follow up.   She was last seen on 02/16/20. Planning on leaving [place], back home in [place] on 03/18/20.  DM 2: Negative for abdominal pain, nausea,vomiting, polydipsia,polyuria, or polyphagia. She is on non pharmacologic treatment. Upset because she has not received glucometer at her pharmacy. According to pt, she was told her PCP has not sent Rx.  Lab Results  Component Value Date   HGBA1C 6.6 (H) 12/07/2019   HTN: She is on non pharmacologic treatment. Denies severe/frequent headache, visual changes, chest pain, dyspnea, palpitation, claudication, focal weakness, or edema. She doe snot check BP at home.  Component     Latest Ref Rng & Units 12/07/2019  Sodium     135 - 145 mEq/L 140  Potassium     3.5 - 5.1 mEq/L 4.1  Chloride     96 - 112 mEq/L 105  CO2     19 - 32 mEq/L 28  Glucose     70 - 99 mg/dL 115 (H)  BUN     6 - 23 mg/dL 14  Creatinine     0.40 - 1.20 mg/dL 0.85   States that Gabapentin is not helping. Feet burning like sensation, which she has had for years and has been stable. No skin lesions or associated  Symptoms interfere with sleep.  On 11/06/19 Gabapentin was started. She has tolerated medication well.  HLD: She is on Pravastatin 20 mg daily. Tolerating medication well.  Component     Latest Ref Rng & Units 06/08/2019  Cholesterol     <200 mg/dL 215 (H)  Triglycerides     <150 mg/dL 106.0  HDL Cholesterol     > OR = 50 mg/dL 47.20  VLDL     0.0 - 40.0 mg/dL 21.2  LDL (calc)     0 - 99 mg/dL 147 (H)  Total CHOL/HDL Ratio     <5.0 (calc) 5  NonHDL      167.86   GERD: In 11/2019 reported that Omeprazole was not helping with heartburn and chest burning like sensation when in bed. She is now on Protonix 40 mg daily ,which is helping. She has not noted abdominal pain,N/V,or melena.   Review of Systems  Constitutional: Negative for activity change,  appetite change, fatigue and fever.  HENT: Negative for mouth sores, nosebleeds and sore throat.   Respiratory: Negative for wheezing.   Gastrointestinal: Negative for abdominal pain, nausea and vomiting.       Negative for changes in bowel habits.  Genitourinary: Negative for decreased urine volume, dysuria and hematuria.  Musculoskeletal: Positive for myalgias. Negative for gait problem.  Neurological: Negative for syncope and facial asymmetry.  Psychiatric/Behavioral: Positive for sleep disturbance. Negative for confusion.  Rest of ROS, see pertinent positives sand negatives in HPI  Current Outpatient Medications on File Prior to Visit  Medication Sig Dispense Refill  . Accu-Chek Softclix Lancets lancets Use as instructed 100 each 12  . aspirin 81 MG tablet Take 81 mg by mouth daily.    . Cholecalciferol (VITAMIN D3) 2000 units TABS Take by mouth daily.    . Continuous Blood Gluc Receiver (DEXCOM G6 RECEIVER) DEVI Use to check blood sugars up to 3 times daily. 1 each 1  . ibuprofen (ADVIL,MOTRIN) 200 MG tablet Take 200 mg by mouth every 6 (six) hours as needed.    . Lancets Misc. (ACCU-CHEK SOFTCLIX LANCET DEV)  KIT 1 Device by Does not apply route daily. 100 kit 3  . polyethylene glycol powder (GLYCOLAX/MIRALAX) powder MIX 255 GRAM PO ONCE FOR 1 DOSE  0   No current facility-administered medications on file prior to visit.    Past Medical History:  Diagnosis Date  . Arthritis    knees, back  . Diabetes mellitus   . Headache   . Hypertension    Allergies  Allergen Reactions  . Norvasc [Amlodipine] Other (See Comments)    Dizziness and Chest pains at night when lies down  . Oxycodone-Acetaminophen Nausea And Vomiting    Social History   Socioeconomic History  . Marital status: Widowed    Spouse name: Not on file  . Number of children: Not on file  . Years of education: Not on file  . Highest education level: Not on file  Occupational History  . Not on file  Tobacco  Use  . Smoking status: Never Smoker  . Smokeless tobacco: Never Used  Substance and Sexual Activity  . Alcohol use: No    Alcohol/week: 0.0 standard drinks  . Drug use: No  . Sexual activity: Not on file  Other Topics Concern  . Not on file  Social History Narrative  . Not on file   Social Determinants of Health   Financial Resource Strain:   . Difficulty of Paying Living Expenses: Not on file  Food Insecurity:   . Worried About Charity fundraiser in the Last Year: Not on file  . Ran Out of Food in the Last Year: Not on file  Transportation Needs:   . Lack of Transportation (Medical): Not on file  . Lack of Transportation (Non-Medical): Not on file  Physical Activity:   . Days of Exercise per Week: Not on file  . Minutes of Exercise per Session: Not on file  Stress:   . Feeling of Stress : Not on file  Social Connections:   . Frequency of Communication with Friends and Family: Not on file  . Frequency of Social Gatherings with Friends and Family: Not on file  . Attends Religious Services: Not on file  . Active Member of Clubs or Organizations: Not on file  . Attends Archivist Meetings: Not on file  . Marital Status: Not on file    Vitals:   03/08/20 1509  BP: 124/70  Pulse: 71  Resp: 16  SpO2: 97%   Body mass index is 25.89 kg/m.  Physical Exam Vitals and nursing note reviewed.  Constitutional:      General: She is not in acute distress.    Appearance: She is well-developed.  HENT:     Head: Normocephalic and atraumatic.  Eyes:     Conjunctiva/sclera: Conjunctivae normal.     Pupils: Pupils are equal, round, and reactive to light.  Cardiovascular:     Rate and Rhythm: Normal rate and regular rhythm.     Pulses:          Dorsalis pedis pulses are 2+ on the right side and 2+ on the left side.     Heart sounds: No murmur heard.   Pulmonary:     Effort: Pulmonary effort is normal. No respiratory distress.     Breath sounds: Normal breath  sounds.  Abdominal:     Palpations: Abdomen is soft. There is no hepatomegaly or mass.     Tenderness: There is no abdominal tenderness.  Lymphadenopathy:     Cervical: No cervical adenopathy.  Skin:  General: Skin is warm.     Findings: No erythema or rash.  Neurological:     Mental Status: She is alert and oriented to person, place, and time.     Cranial Nerves: No cranial nerve deficit.     Gait: Gait normal.  Psychiatric:     Comments: Well groomed, good eye contact.   ASSESSMENT AND PLAN:  Julie Schmidt was seen today for follow-up.  Orders Placed This Encounter  Procedures  . Hepatitis C antibody  . Hemoglobin A1c  . Lipid panel   Lab Results  Component Value Date   HGBA1C 6.3 (H) 03/08/2020   Lab Results  Component Value Date   CHOL 158 03/08/2020   HDL 41 (L) 03/08/2020   LDLCALC 92 03/08/2020   TRIG 145 03/08/2020   CHOLHDL 3.9 03/08/2020    Type 2 diabetes mellitus with neurological complications ([place]) Continue non pharmacologic treatment. 2 different glucometer sent, different brands.Explained that I have sent Rx for glucometer in the past, her insurance may not cover. Instructed to call insurance if she does not receive glucometer today. Monitor BS's regularly. Periodic eye exam and good dental and foot care. Further recommendations according to HgA1C.  Gastroesophageal reflux disease without esophagitis Better controlled. Continue Protonix 40 mg daily for a few more weeks. Some side effects discussed. She can decrease frequency to q 2 days and eventually prn if symptoms still well controlled. GERD precautions.  -     pantoprazole (PROTONIX) 40 MG tablet; Take 1 tablet (40 mg total) by mouth daily.  Essential hypertension BP adequately controlled. Continue non pharmacologic treatment. Instructed to monitor BP regularly and continue low salt diet.  Hyperlipidemia associated with type 2 diabetes mellitus ([facility]) Continue Pravastatin 20 mg  daily. We will follow labs done today and will give further recommendations accordingly.  Encounter for HCV screening test for low risk patient -     Hepatitis C antibody  Polyneuropathy associated with underlying disease ([place]) Chronic and stable. Gabapentin 100 mg is not helping. Because she is leaving in a few weeks and not able to follow, she agrees with increasing dose from 100 mg to 300 mg at bedtime. If not any better, she can discontinue Gabapentin. Some side effects discussed. Good foot care.  -     gabapentin (NEURONTIN) 300 MG capsule; Take 1 capsule (300 mg total) by mouth at bedtime.   She is not sure for how long she is going to be out of the country and not certain if she can afford medical care overseas. So 6 months refills for Pravastatin given. She needs to monitor BP and BS regularly.  Return in about 6 months (around 09/05/2020) for Leaving the country.   Kemuel Buchmann G. [name], MD  [facility]. [place] office.   A few things to remember from today's visit:   Essential hypertension  Gastroesophageal reflux disease without esophagitis - Plan: pantoprazole (PROTONIX) 40 MG tablet  Hyperlipidemia associated with type 2 diabetes mellitus ([place]) - Plan: Lipid panel  Type 2 diabetes mellitus with neurological complications ([place]) - Plan: Hemoglobin A1c  Encounter for HCV screening test for low risk patient - Plan: Hepatitis C antibody  If you need refills please call your pharmacy. Do not use My Chart to request refills or for acute issues that need immediate attention.   Gabapentin increased to 300 mg to take daily at bedtime. If medication does not help after 4-5 weeks, you can discontinue it.  If acid reflux symptoms improved,  you can try Protonix (pantoprazole) every other day.  Please be sure medication list is accurate. If a new problem present, please set up appointment sooner than planned today.

## 2020-03-09 LAB — LIPID PANEL
Cholesterol: 158 mg/dL (ref <200)
HDL: 41 mg/dL — ABNORMAL LOW (ref >=50)
LDL Cholesterol (Calc): 92 mg/dL (calc)
Non-HDL Cholesterol (Calc): 117 mg/dL (calc) (ref <130)
Total CHOL/HDL Ratio: 3.9 (calc) (ref <5.0)
Triglycerides: 145 mg/dL (ref <150)

## 2020-03-09 LAB — HEMOGLOBIN A1C
Hgb A1c MFr Bld: 6.3 % of total Hgb — ABNORMAL HIGH (ref <5.7)
Mean Plasma Glucose: 134 (calc)
eAG (mmol/L): 7.4 (calc)

## 2020-03-09 LAB — HEPATITIS C ANTIBODY
Hepatitis C Ab: NONREACTIVE
SIGNAL TO CUT-OFF: 0.01 (ref <1.00)

## 2020-03-09 NOTE — Patient Instructions (Addendum)
Hi Julie Schmidt,  It was lovely getting to speak with you again and talk about your medicines this time! I attached some information about checking your blood sugars when you feel like you may be having a low one (any numbers < 70).   Please don't hesitate to call me at any point if you have any questions or concerns. I look forward to speaking with you again soon!  Best, Maddie  Jeni Salles, PharmD Clinical Pharmacist [facility] [place]   Visit Information  Goals Addressed            This Visit's Progress   . Pharmacy Care Plan       CARE PLAN ENTRY (see longitudinal plan of care for additional care plan information)  Current Barriers:  . Chronic Disease Management support, education, and care coordination needs related to Hypertension, Hyperlipidemia, Diabetes, GERD, and nerve pain, and heart event prevention   Hypertension BP Readings from Last 3 Encounters:  03/08/20 124/70  02/16/20 110/70  02/01/20 140/89   . Pharmacist Clinical Goal(s): o Over the next 90 days, patient will work with PharmD and providers to maintain BP goal <130/80 . Current regimen:  o No medications . Interventions: o We discussed checking your blood pressure at home if you feel dizzy/lightheaded . Patient self care activities - Over the next 90 days, patient will: o Check blood pressure when you feel dizzy or lightheaded document, and provide at future appointments o Ensure daily salt intake < 2300 mg/day  Hyperlipidemia Lab Results  Component Value Date/Time   LDLCALC 92 03/08/2020 04:02 PM   . Pharmacist Clinical Goal(s): o Over the next 90 days, patient will work with PharmD and providers to maintain LDL goal < 100 . Current regimen:  o Pravastatin 20 mg 1 tablet daily . Interventions: o We discussed increasing fiber in your diet to 10-25 g per day to help lower cholesterol o We discussed the importance of getting regular exercise to help lower  cholesterol . Patient self care activities - Over the next 90 days, patient will: o Continue current medications  Diabetes Lab Results  Component Value Date/Time   HGBA1C 6.3 (H) 03/08/2020 04:02 PM   HGBA1C 6.6 (H) 12/07/2019 02:39 PM   . Pharmacist Clinical Goal(s): o Over the next 90 days, patient will work with PharmD and providers to maintain A1c goal <7% . Current regimen:  o No medications  . Interventions: o We discussed getting testing supplies to check your blood sugars if you are feeling shaky, sweaty, hungry or dizzy o We discussed eating more green leafy vegetables and less carbs at meals with the healthy plate method and provided handout . Patient self care activities - Over the next 90 days, patient will: o Check blood sugar document, and provide at future appointments o Contact provider with any episodes of hypoglycemia  GERD . Pharmacist Clinical Goal(s) o Over the next 90 days, patient will work with PharmD and providers to minimize symptoms of heartburn . Current regimen:  o Pantoprazole 40 mg 1 tablet daily . Interventions: o We discussed other ways to manage heartburn such as avoiding eating 2 hours before bedtime, elevating the head of your bed, and avoiding spicy or fatty foods that may cause heartburn . Patient self care activities - Over the next 90 days, patient will: o Continue current medications   Prevention of heart events . Pharmacist Clinical Goal(s) o Over the next 90 days, patient will work with PharmD and providers to prevent  heart events . Current regimen:  o Aspirin 81 mg 1 tablet at bedtime . Patient self care activities - Over the next 90 days, patient will: o Monitor for signs of bleeding (unexpected bruising, blood in urine or stool, nosebleeds)  Nerve Pain . Pharmacist Clinical Goal(s) o Over the next 90 days, patient will work with PharmD and providers to minimize symptoms of nerve pain . Current regimen:  o Gabapentin 300 mg 1  tablet at bedtime . Patient self care activities - Over the next 90 days, patient will: o Continue taking medications as prescribed  Medication management . Pharmacist Clinical Goal(s): o Over the next 90 days, patient will work with PharmD and providers to maintain optimal medication adherence . Current pharmacy: [facility] . Interventions o Comprehensive medication review performed. o Continue current medication management strategy . Patient self care activities - Over the next 90 days, patient will: o Take medications as prescribed o Report any questions or concerns to PharmD and/or provider(s)  Initial goal documentation        Julie Schmidt was given information about Chronic Care Management services today including:  1. CCM service includes personalized support from designated clinical staff supervised by her physician, including individualized plan of care and coordination with other care providers 2. 24/7 contact phone numbers for assistance for urgent and routine care needs. 3. Standard insurance, coinsurance, copays and deductibles apply for chronic care management only during months in which we provide at least 20 minutes of these services. Most insurances cover these services at 100%, however patients may be responsible for any copay, coinsurance and/or deductible if applicable. This service may help you avoid the need for more expensive face-to-face services. 4. Only one practitioner may furnish and bill the service in a calendar month. 5. The patient may stop CCM services at any time (effective at the end of the month) by phone call to the office staff.  Patient agreed to services and verbal consent obtained.   The patient verbalized understanding of instructions provided today and agreed to receive a mailed copy of patient instruction and/or educational materials. Telephone follow up appointment with pharmacy team member scheduled for: 3 months  Hypoglycemia Hypoglycemia  is when the sugar (glucose) level in your blood is too low. Signs of low blood sugar may include:  Feeling: ? Hungry. ? Worried or nervous (anxious). ? Sweaty and clammy. ? Confused. ? Dizzy. ? Sleepy. ? Sick to your stomach (nauseous).  Having: ? A fast heartbeat. ? A headache. ? A change in your vision. ? Tingling or no feeling (numbness) around your mouth, lips, or tongue. ? Jerky movements that you cannot control (seizure).  Having trouble with: ? Moving (coordination). ? Sleeping. ? Passing out (fainting). ? Getting upset easily (irritability). Low blood sugar can happen to people who have diabetes and people who do not have diabetes. Low blood sugar can happen quickly, and it can be an emergency. Treating low blood sugar Low blood sugar is often treated by eating or drinking something sugary right away, such as:  Fruit juice, 4-6 oz (120-150 mL).  Regular soda (not diet soda), 4-6 oz (120-150 mL).  Low-fat milk, 4 oz (120 mL).  Several pieces of hard candy.  Sugar or honey, 1 Tbsp (15 mL). Treating low blood sugar if you have diabetes If you can think clearly and swallow safely, follow the 15:15 rule:  Take 15 grams of a fast-acting carb (carbohydrate). Talk with your doctor about how much you should take.  Always keep a source of fast-acting carb with you, such as: ? Sugar tablets (glucose pills). Take 3-4 pills. ? 6-8 pieces of hard candy. ? 4-6 oz (120-150 mL) of fruit juice. ? 4-6 oz (120-150 mL) of regular (not diet) soda. ? 1 Tbsp (15 mL) honey or sugar.  Check your blood sugar 15 minutes after you take the carb.  If your blood sugar is still at or below 70 mg/dL (3.9 mmol/L), take 15 grams of a carb again.  If your blood sugar does not go above 70 mg/dL (3.9 mmol/L) after 3 tries, get help right away.  After your blood sugar goes back to normal, eat a meal or a snack within 1 hour.  Treating very low blood sugar If your blood sugar is at or below  54 mg/dL (3 mmol/L), you have very low blood sugar (severe hypoglycemia). This may also cause:  Passing out.  Jerky movements you cannot control (seizure).  Losing consciousness (coma). This is an emergency. Do not wait to see if the symptoms will go away. Get medical help right away. Call your local emergency services (911 in the [place] Do not drive yourself to the hospital. If you have very low blood sugar and you cannot eat or drink, you may need a glucagon shot (injection). A family member or friend should learn how to check your blood sugar and how to give you a glucagon shot. Ask your doctor if you need to have a glucagon shot kit at home. Follow these instructions at home: Schmidt instructions  Take over-the-counter and prescription medicines only as told by your doctor.  Stay aware of your blood sugar as told by your doctor.  Limit alcohol intake to no more than 1 drink a day for nonpregnant women and 2 drinks a day for men. One drink equals 12 oz of beer (355 mL), 5 oz of wine (148 mL), or 1 oz of hard liquor (44 mL).  Keep all follow-up visits as told by your doctor. This is important. If you have diabetes:   Follow your diabetes care plan as told by your doctor. Make sure you: ? Know the signs of low blood sugar. ? Take your medicines as told. ? Follow your exercise and meal plan. ? Eat on time. Do not skip meals. ? Check your blood sugar as often as told by your doctor. Always check it before and after exercise. ? Follow your sick day plan when you cannot eat or drink normally. Make this plan ahead of time with your doctor.  Share your diabetes care plan with: ? Your work or school. ? People you live with.  Check your pee (urine) for ketones: ? When you are sick. ? As told by your doctor.  Carry a card or wear jewelry that says you have diabetes. Contact a doctor if:  You have trouble keeping your blood sugar in your target range.  You have low blood sugar  often. Get help right away if:  You still have symptoms after you eat or drink something sugary.  Your blood sugar is at or below 54 mg/dL (3 mmol/L).  You have jerky movements that you cannot control.  You pass out. These symptoms may be an emergency. Do not wait to see if the symptoms will go away. Get medical help right away. Call your local emergency services (911 in the [place] Do not drive yourself to the hospital. Summary  Hypoglycemia happens when the level of sugar (glucose) in your blood  is too low.  Low blood sugar can happen to people who have diabetes and people who do not have diabetes. Low blood sugar can happen quickly, and it can be an emergency.  Make sure you know the signs of low blood sugar and know how to treat it.  Always keep a source of sugar (fast-acting carb) with you to treat low blood sugar. This information is not intended to replace advice given to you by your health care provider. Make sure you discuss any questions you have with your health care provider. Document Revised: 09/17/2018 Document Reviewed: 06/30/2015 Elsevier Patient Education  2020 [employer].

## 2020-03-10 ENCOUNTER — Telehealth: Payer: Self-pay

## 2020-03-10 DIAGNOSIS — E785 Hyperlipidemia, unspecified: Secondary | ICD-10-CM

## 2020-03-10 DIAGNOSIS — I10 Essential (primary) hypertension: Secondary | ICD-10-CM

## 2020-03-10 NOTE — Telephone Encounter (Signed)
-----   Message from Viona Gilmore, [facility] sent at 03/10/2020  9:37 AM EDT ----- Regarding: CCM referral Hi Yajahira Tison,  Can you please place a CCM referral for Ms. Julie Schmidt?  Thank you so much!  Best, Maddie  Jeni Salles, PharmD Clinical Pharmacist [facility] [place]

## 2020-03-12 ENCOUNTER — Encounter: Payer: Self-pay | Admitting: Family Medicine

## 2020-03-12 MED ORDER — PRAVASTATIN SODIUM 20 MG PO TABS: 20.0000 mg | ORAL_TABLET | Freq: Every day | ORAL | 0 refills | Status: DC

## 2020-03-18 DIAGNOSIS — Z20822 Contact with and (suspected) exposure to covid-19: Secondary | ICD-10-CM | POA: Diagnosis not present

## 2020-04-04 ENCOUNTER — Telehealth: Payer: Self-pay | Admitting: Family Medicine

## 2020-04-04 NOTE — Telephone Encounter (Signed)
NO ANSWER/NO VM. Pt due to schedule Medicare Annual Wellness Visit (AWV) either virtually or in office.  Last AWV 06/07/16; please schedule at anytime with [facility] Nurse Health Advisor 2.  This should be a 45 minute visit.

## 2020-06-07 ENCOUNTER — Ambulatory Visit: Payer: Medicare Other | Admitting: Family Medicine

## 2020-06-16 ENCOUNTER — Telehealth: Payer: Self-pay | Admitting: Family Medicine

## 2020-06-16 NOTE — Telephone Encounter (Signed)
Tried called patient to  schedule Medicare Annual Wellness Visit (AWV) either virtually or in office.  No answer   Last AWV 06/07/16  please schedule at anytime with [facility] Nurse Health Advisor 1 or 2   This should be a 45 minute visit.

## 2020-06-23 ENCOUNTER — Telehealth: Payer: Medicare Other

## 2020-08-24 ENCOUNTER — Telehealth: Payer: Medicare Other

## 2020-09-12 ENCOUNTER — Telehealth: Payer: Self-pay | Admitting: Pharmacist

## 2020-09-12 NOTE — Chronic Care Management (AMB) (Addendum)
I left the patient's daughter a message about her upcoming appointment on 09/13/2020 @ 3:00 pm with the clinical pharmacist. She was asked to please have all medication on hand to review with the pharmacist.  Spoke with daughter and will plan for her CCM visit to be in office prior to her PCP appointment.  Maia Breslow, [place] [phone]

## 2020-09-13 ENCOUNTER — Encounter: Payer: Self-pay | Admitting: Family Medicine

## 2020-09-13 ENCOUNTER — Other Ambulatory Visit: Payer: Self-pay

## 2020-09-13 ENCOUNTER — Ambulatory Visit (INDEPENDENT_AMBULATORY_CARE_PROVIDER_SITE_OTHER): Payer: Medicare Other | Admitting: Family Medicine

## 2020-09-13 ENCOUNTER — Ambulatory Visit (INDEPENDENT_AMBULATORY_CARE_PROVIDER_SITE_OTHER): Payer: Medicare Other | Admitting: Pharmacist

## 2020-09-13 VITALS — BP 128/70 | HR 69 | Resp 16 | Ht 61.0 in

## 2020-09-13 DIAGNOSIS — E785 Hyperlipidemia, unspecified: Secondary | ICD-10-CM

## 2020-09-13 DIAGNOSIS — E1169 Type 2 diabetes mellitus with other specified complication: Secondary | ICD-10-CM | POA: Diagnosis not present

## 2020-09-13 DIAGNOSIS — K219 Gastro-esophageal reflux disease without esophagitis: Secondary | ICD-10-CM

## 2020-09-13 DIAGNOSIS — I10 Essential (primary) hypertension: Secondary | ICD-10-CM

## 2020-09-13 DIAGNOSIS — E1149 Type 2 diabetes mellitus with other diabetic neurological complication: Secondary | ICD-10-CM | POA: Diagnosis not present

## 2020-09-13 DIAGNOSIS — G63 Polyneuropathy in diseases classified elsewhere: Secondary | ICD-10-CM

## 2020-09-13 DIAGNOSIS — M79645 Pain in left finger(s): Secondary | ICD-10-CM

## 2020-09-13 MED ORDER — PRAVASTATIN SODIUM 20 MG PO TABS: 20.0000 mg | ORAL_TABLET | Freq: Every day | ORAL | 2 refills | Status: DC

## 2020-09-13 MED ORDER — DULOXETINE HCL 30 MG PO CPEP: 30.0000 mg | ORAL_CAPSULE | Freq: Every day | ORAL | 2 refills | Status: DC

## 2020-09-13 MED ORDER — PANTOPRAZOLE SODIUM 40 MG PO TBEC: 40.0000 mg | DELAYED_RELEASE_TABLET | Freq: Every day | ORAL | 2 refills | Status: DC

## 2020-09-13 MED ORDER — ACCU-CHEK GUIDE ME W/DEVICE KIT: PACK | 0 refills | Status: AC

## 2020-09-13 MED ORDER — ACCU-CHEK SOFTCLIX LANCETS MISC
12 refills | Status: AC
Start: 2020-09-13 — End: ?

## 2020-09-13 MED ORDER — ACCU-CHEK GUIDE VI STRP: ORAL_STRIP | 12 refills | Status: AC

## 2020-09-13 NOTE — Progress Notes (Signed)
Chronic Care Management Pharmacy Note  09/25/2020 Name:  Julie Schmidt MRN:  [id number] DOB:  August 21, 1940  Subjective: Julie Schmidt is an 80 y.o. year old female who is a primary patient of [name], Malka So, MD.  The CCM team was consulted for assistance with disease management and care coordination needs.    Engaged with patient face to face for follow up visit in response to provider referral for pharmacy case management and/or care coordination services.   Consent to Services:  The patient was given information about Chronic Care Management services, agreed to services, and gave verbal consent prior to initiation of services.  Please see initial visit note for detailed documentation.   Patient Care Team: [name], [name], MD as PCP - General (Family Medicine) Viona Gilmore, [facility] as Pharmacist (Pharmacist)  Recent office visits: 03/08/20 Betty [name], MD: Patient presented for chronic conditions follow up. Resent glucometer rx for patient to check BGs at home. Recommended to decrease pantoprazole frequency to every 2 days and eventually prn if symptoms still well controlled.   02/16/20 Betty [name], MD - Patient presented for follow up with questions about recent ultrasound results. Ultrasound showed two simple hepatic cysts. Patient is also upset because she did not receive Dexcom or Freestyle devices. Flu vaccine was given and EKG ordered. Follow up in 2 weeks.  Recent consult visits: none  Hospital visits: None in previous 6 months  Objective:  Lab Results  Component Value Date   CREATININE 0.79 09/13/2020   BUN 15 09/13/2020   GFR 72.09 09/13/2020   GFRNONAA >60 06/21/2016   GFRAA >60 06/21/2016   NA 141 09/13/2020   K 4.2 09/13/2020   CALCIUM 9.5 09/13/2020   CO2 30 09/13/2020   GLUCOSE 84 09/13/2020    Lab Results  Component Value Date/Time   HGBA1C 6.3 09/13/2020 04:28 PM   HGBA1C 6.3 (H) 03/08/2020 04:02 PM   GFR 72.09 09/13/2020 04:28 PM   GFR 78.52  12/07/2019 02:39 PM   MICROALBUR 1.3 12/07/2019 02:39 PM   MICROALBUR 2.2 (H) 06/08/2019 11:33 AM    Last diabetic Eye exam:  Lab Results  Component Value Date/Time   HMDIABEYEEXA No Retinopathy 07/02/2016 12:00 AM    Last diabetic Foot exam: No results found for: HMDIABFOOTEX   Lab Results  Component Value Date   CHOL 204 (H) 09/13/2020   HDL 55.40 09/13/2020   LDLCALC 136 (H) 09/13/2020   TRIG 60.0 09/13/2020   CHOLHDL 4 09/13/2020    Hepatic Function Latest Ref Rng & Units 09/13/2020 02/01/2020 12/07/2019  Total Protein 6.0 - 8.3 g/dL 7.3 7.5 7.1  Albumin 3.5 - 5.2 g/dL 4.3 - 4.5  AST 0 - 37 U/L '30 18 18  ' ALT 0 - 35 U/L '31 14 11  ' Alk Phosphatase 39 - 117 U/L 118(H) - 97  Total Bilirubin 0.2 - 1.2 mg/dL 0.5 0.4 0.4  Bilirubin, Direct 0.0 - 0.2 mg/dL - 0.1 -    Lab Results  Component Value Date/Time   TSH 1.47 05/07/2016 04:41 PM    CBC Latest Ref Rng & Units 02/01/2020 06/21/2016 05/07/2016  WBC 3.8 - 10.8 Thousand/uL 5.4 5.8 5.9  Hemoglobin 11.7 - 15.5 g/dL 12.1 12.3 12.2  Hematocrit 35.0 - 45.0 % 36.1 38.0 36.1  Platelets 140 - 400 Thousand/uL 297 303 287.0    No results found for: VD25OH  Clinical ASCVD: No  The 10-year ASCVD risk score Mikey Bussing DC Jr., et al., 2013) is: 40.3%   Values  used to calculate the score:     Age: 24 years     Sex: Female     Is Non-Hispanic African American: Yes     Diabetic: Yes     Tobacco smoker: No     Systolic Blood Pressure: 371 mmHg     Is BP treated: No     HDL Cholesterol: 55.4 mg/dL     Total Cholesterol: 204 mg/dL    Depression screen [facility] 2/9 03/12/2020 06/11/2019 01/14/2018  Decreased Interest 0 0 0  Down, Depressed, Hopeless 0 0 0  PHQ - 2 Score 0 0 0      Social History   Tobacco Use  Smoking Status Never Smoker  Smokeless Tobacco Never Used   BP Readings from Last 3 Encounters:  09/13/20 128/70  03/08/20 124/70  02/16/20 110/70   Pulse Readings from Last 3 Encounters:  09/13/20 69  03/08/20 71  02/16/20 67    Wt Readings from Last 3 Encounters:  03/08/20 137 lb (62.1 kg)  02/16/20 139 lb (63 kg)  02/01/20 134 lb 9.6 oz (61.1 kg)   BMI Readings from Last 3 Encounters:  09/13/20 25.89 kg/m  03/08/20 25.89 kg/m  02/16/20 26.26 kg/m    Assessment/Interventions: Review of patient past medical history, allergies, medications, health status, including review of consultants reports, laboratory and other test data, was performed as part of comprehensive evaluation and provision of chronic care management services.   SDOH:  (Social Determinants of Health) assessments and interventions performed: No  SDOH Screenings   Alcohol Screen: Not on file  Depression (PHQ2-9): Low Risk   . PHQ-2 Score: 0  Financial Resource Strain: Not on file  Food Insecurity: Not on file  Housing: Not on file  Physical Activity: Not on file  Social Connections: Not on file  Stress: Not on file  Tobacco Use: Low Risk   . Smoking Tobacco Use: Never Smoker  . Smokeless Tobacco Use: Never Used  Transportation Needs: Not on file    CCM Care Plan  Allergies  Allergen Reactions  . Norvasc [Amlodipine] Other (See Comments)    Dizziness and Chest pains at night when lies down  . Oxycodone-Acetaminophen Nausea And Vomiting    Medications Reviewed Today    Reviewed by [name], [name], MD (Physician) on 09/13/20 at [place] List Status: <None>  Medication Order Taking? Sig Documenting Provider Last Dose Status Informant  Accu-Chek Softclix Lancets lancets [id number]  Use to check blood sugar daily. Dx:e11.49 [name], [name], MD  Active   aspirin 81 MG tablet [id number] Yes Take 81 mg by mouth daily. [provider] Taking Active Self  Blood Glucose Monitoring Suppl (ACCU-CHEK AVIVA CONNECT) w/Device KIT [id number] Yes 1 Device by Does not apply route daily. [name], [name], MD Taking Active   Blood Glucose Monitoring Suppl (ACCU-CHEK GUIDE ME) w/Device KIT [id number] Yes Use to check blood sugars daily.  [name], [name], MD  Active         Discontinued 09/13/20 1602   Cholecalciferol (VITAMIN D3) 2000 units TABS [id number] Yes Take by mouth daily. [provider] Taking Active Self  Continuous Blood Gluc Receiver ([place]) [place] [id number] Yes Use to check blood sugars up to 3 times daily. [name], [name], MD Taking Active         Discontinued 09/13/20 1555   glucose blood (ACCU-CHEK GUIDE) test strip [id number] Yes Use to check blood sugars daily. Dx:e11.49 [name], [name], MD  Active   ibuprofen (  ADVIL,MOTRIN) 200 MG tablet [id number] Yes Take 200 mg by mouth every 6 (six) hours as needed. [provider] Taking Active Self  Lancets Misc. (ACCU-CHEK SOFTCLIX LANCET DEV) KIT [id number] Yes 1 Device by Does not apply route daily. [name], [name], MD Taking Active   latanoprost (XALATAN) 0.005 % ophthalmic solution [id number] No 1 drop at bedtime.  Patient not taking: No sig reported   [provider] Not Taking Active   pantoprazole (PROTONIX) 40 MG tablet [id number] No Take 1 tablet (40 mg total) by mouth daily.  Patient not taking: No sig reported   [name], [name], MD Not Taking Active   polyethylene glycol powder (GLYCOLAX/MIRALAX) powder [id number] Yes MIX 255 GRAM PO ONCE FOR 1 DOSE [provider] Taking Active   pravastatin (PRAVACHOL) 20 MG tablet [id number] No Take 1 tablet (20 mg total) by mouth daily.  Patient not taking: No sig reported   [name], [name], MD Not Taking Active           Patient Active Problem List   Diagnosis Date Noted  . GERD (gastroesophageal reflux disease) 12/07/2019  . Generalized osteoarthritis of multiple sites 04/03/2018  . Hyperlipidemia associated with type 2 diabetes mellitus ([place]) 01/14/2018  . Primary hypertension 05/07/2016  . Abnormal nuclear stress test 11/25/2014  . Abnormal EKG 11/02/2014  . Prolonged Q-T interval on ECG 11/02/2014  . Diet-controlled diabetes mellitus ([place]) 11/02/2014  . Helicobacter  pylori gastritis 09/18/2014  . Type 2 diabetes mellitus with neurological complications ([place]) 28/20/6015  . Meningioma ([place]) 05/19/2014  . Back pain, chronic 10/25/2011    Immunization History  Administered Date(s) Administered  . Fluad Quad(high Dose 65+) 06/08/2019, 02/16/2020  . Hepatitis A, Adult 04/03/2018  . Influenza, High Dose Seasonal PF 04/23/2016  . Influenza-Unspecified 03/29/2014  . PFIZER(Purple Top)SARS-COV-2 Vaccination 06/28/2019, 07/19/2019, 03/03/2020  . Pneumococcal Conjugate-13 01/30/2015, 03/13/2018  . Pneumococcal Polysaccharide-23 06/07/2016    Conditions to be addressed/monitored:  Hypertension, Hyperlipidemia, GERD and Neuropathy, constipation and ASCVD prevention  Care Plan : CCM Pharmacy Care Plan  Updates made by Viona Gilmore, [place] since 09/25/2020 12:00 AM    Problem: Problem: Hypertension, Hyperlipidemia, GERD and Neuropathy, constipation and ASCVD prevention     Long-Range Goal: Patient-Specific Goal   Start Date: 09/13/2020  Expected End Date: 09/13/2021  This Visit's Progress: On track  Priority: High  Note:   Current Barriers:  . Unable to independently monitor therapeutic efficacy . Unable to self administer medications as prescribed  Pharmacist Clinical Goal(s):  Marland Kitchen Patient will achieve adherence to monitoring guidelines and medication adherence to achieve therapeutic efficacy through collaboration with PharmD and provider.   Interventions: . 1:1 collaboration with [name], [name], MD regarding development and update of comprehensive plan of care as evidenced by provider attestation and co-signature . Inter-disciplinary care team collaboration (see longitudinal plan of care) . Comprehensive medication review performed; medication list updated in electronic medical record  Hypertension (BP goal <140/90) -Controlled -Current treatment: . No medications -Medications previously tried: amlodipine (dizziness and chest pain)  -Current home  readings: does not check at home -Current dietary habits: limits salt intake -Current exercise habits: limited exercise -Denies hypotensive/hypertensive symptoms -Educated on Symptoms of hypotension and importance of maintaining adequate hydration; -Counseled to monitor BP at home when symptomatic, document, and provide log at future appointments -Counseled on diet and exercise extensively  Hyperlipidemia: (LDL goal < 100) -Controlled -Current treatment: . Pravastatin 20 mg 1 tablet daily -Medications previously tried: none  -Current dietary patterns:  does not fry foods often -Current exercise habits: no structured exercise -Educated on Cholesterol goals;  Importance of limiting foods high in cholesterol; -Counseled on diet and exercise extensively Recommended to restart taking medication as patient [place] out [place].  Diabetes (A1c goal <7%) -Controlled -Current medications: . No medications -Medications previously tried: none  -Current home glucose readings . fasting glucose: does not have testing supplies for glucometer . post prandial glucose: does not have testing supplies for glucometer -Denies hypoglycemic/hyperglycemic symptoms -Current meal patterns:  . breakfast: did not discuss  . lunch: did not discuss  . dinner: did not discuss  . snacks: did not discuss  . drinks: did not discuss  -Current exercise: limited exercise -Educated on Benefits of routine self-monitoring of blood sugar; -Counseled to check feet daily and get yearly eye exams -Counseled on diet and exercise extensively Requested new prescription to be sent for glucometer and testing supplies for patient to start checking at home.  GERD (Goal: minimize symptoms) -Controlled -Current treatment  . Omeprazole 20 mg 1 capsule daily -Medications previously tried: pantoprazole (ran out while in [place])  - Patient will switch back to pantoprazole once new prescription is sent in  Nerve pain (Goal:  minimize symptoms of neuropathy) -Uncontrolled -Current treatment  . Gabapentin 300 mg 1 tablet at bedtime (stopped taking due to ineffectiveness) -Medications previously tried: none  -Recommended for patient to discuss with PCP about starting an alternative medication.  Health Maintenance -Vaccine gaps: shingles (did both at pharmacy) -Current therapy:  . Aspirin 81 mg 1 tablet daily  Ibuprofen 200 mg 1 tablet as needed (takes about once a week)   Vitamin D3 2000 units daily -Educated on Cost vs benefit of each product must be carefully weighed by individual consumer -Patient is satisfied with current therapy and denies issues -Recommended to continue current medication  Patient Goals/Self-Care Activities . Patient will:  - take medications as prescribed check glucose as directed, document, and provide at future appointments target a minimum of 150 minutes of moderate intensity exercise weekly  Follow Up Plan: Telephone follow up appointment with care management team member scheduled for: 6 months       Medication Assistance: None required.  Patient affirms current coverage meets needs.  Patient's preferred pharmacy is:  [facility] [place], [place] - [address] AT [place] [address] Phone: [phone] Fax: [phone]  [id number] - [place], [place] [address] AT [place] [address] Phone: [phone] Fax: [phone]  [facility] - [place], [place] - [address] [place] [address] Phone: [phone] Fax: [phone]  Uses pill box? No - not many medications Pt endorses 50% compliance - ran out of half of her medications while in [place]  We discussed: Benefits of medication synchronization, packaging and delivery as well as enhanced  pharmacist oversight with [facility]. Patient decided to: Utilize [facility] pharmacy for medication synchronization, packaging and delivery  Care Plan and Follow Up Patient Decision:  Patient agrees to Care Plan and Follow-up.  Plan: Telephone follow up appointment with care management team member scheduled for:  6 months  Jeni Salles, PharmD [place] Pharmacist [place] at [place] [phone]

## 2020-09-13 NOTE — Progress Notes (Signed)
HPI:  Ms.Lunabelle ALEECE LOYD is a 80 y.o. female, who is here today with her daughter for follow up.   She was last seen on 03/08/20. Back to [place] 2 weeks ago. Negative for new problem while she was overseas.  She needs refills for some of her medications. DM II: Dx;ed in 2003. She is on non pharmacologic treatment. She is not checking BS's. Having problems with her health insurance coverage for a glucometer. 02/2020 HgA1C was 6.3.  Lab Results  Component Value Date   MICROALBUR 1.3 12/07/2019   MICROALBUR 2.2 (H) 06/08/2019   States that Gabapentin 300 mg at night has not helped at all with feet burning sensation. Problem has been going on for years, it is mainly at night, and has been stable. No associated claudication like symptoms.  02/2020 ABI  Right: Resting right ankle-brachial index is within normal range. No evidence of significant right lower extremity arterial disease. The right toe-brachial index is abnormal.  Left: Resting left ankle-brachial index is within normal range. No evidence of significant left lower extremity arterial disease. The left  toe-brachial index is abnormal.  She is on Aspirin 81 mg daily.  HLD: She was on Pravastatin 20 mg daily, ran out while she was back home. Tolerating medication well.  Lab Results  Component Value Date   CHOL 158 03/08/2020   HDL 41 (L) 03/08/2020   LDLCALC 92 03/08/2020   TRIG 145 03/08/2020   CHOLHDL 3.9 03/08/2020   Lab Results  Component Value Date   ALT 14 02/01/2020   AST 18 02/01/2020   ALKPHOS 97 12/07/2019   BILITOT 0.4 02/01/2020   HTN:  She is not on pharmacologic treatment. Until 2020 she was on Bisoprolol-HCTZ 10-6.25 mg. Negative for severe/frequent headache, visual changes, chest pain, dyspnea, palpitation, focal weakness, or edema.  Today she is c/o 2 weeks of constant left index fingertip pain. She is right handed. No hx of trauma. Pain is exacerbated by palpation/pressing area. She has not  noted edema,erythema,or cyanosis. Negative for fingernail changes.  Hx of OA, IP joint pain. No edema or erythema.  GERD: She is on OTC Omeprazole 40 mg daily because ran out of Protonix. Omeprazole does not help as Protonix did with heartburn/chest burning sensation. It is worse at night when she is in bed.  She has not noted melena,nausea,or vomiting.  Review of Systems  Constitutional: Negative for activity change, appetite change, fatigue and fever.  HENT: Negative for mouth sores, nosebleeds, sore throat and trouble swallowing.   Respiratory: Negative for cough and wheezing.   Gastrointestinal: Negative for abdominal pain.       Negative for changes in bowel habits.  Genitourinary: Negative for decreased urine volume and hematuria.  Musculoskeletal: Positive for arthralgias. Negative for gait problem.  Skin: Negative for rash and wound.  Neurological: Negative for syncope, facial asymmetry and weakness.  Rest of ROS, see pertinent positives sand negatives in HPI  Current Outpatient Medications on File Prior to Visit  Medication Sig Dispense Refill  . aspirin 81 MG tablet Take 81 mg by mouth daily.    . Blood Glucose Monitoring Suppl (ACCU-CHEK AVIVA CONNECT) w/Device KIT 1 Device by Does not apply route daily. 1 kit 0  . Cholecalciferol (VITAMIN D3) 2000 units TABS Take by mouth daily.    . Continuous Blood Gluc Receiver (DEXCOM G6 RECEIVER) DEVI Use to check blood sugars up to 3 times daily. 1 each 1  . ibuprofen (ADVIL,MOTRIN) 200 MG tablet Take  200 mg by mouth every 6 (six) hours as needed.    . Lancets Misc. (ACCU-CHEK SOFTCLIX LANCET DEV) KIT 1 Device by Does not apply route daily. 100 kit 3  . polyethylene glycol powder (GLYCOLAX/MIRALAX) powder MIX 255 GRAM PO ONCE FOR 1 DOSE  0  . latanoprost (XALATAN) 0.005 % ophthalmic solution 1 drop at bedtime. (Patient not taking: No sig reported)     No current facility-administered medications on file prior to visit.     Past  Medical History:  Diagnosis Date  . Arthritis    knees, back  . Diabetes mellitus   . Headache   . Hypertension    Allergies  Allergen Reactions  . Norvasc [Amlodipine] Other (See Comments)    Dizziness and Chest pains at night when lies down  . Oxycodone-Acetaminophen Nausea And Vomiting    Social History   Socioeconomic History  . Marital status: Widowed    Spouse name: Not on file  . Number of children: Not on file  . Years of education: Not on file  . Highest education level: Not on file  Occupational History  . Not on file  Tobacco Use  . Smoking status: Never Smoker  . Smokeless tobacco: Never Used  Substance and Sexual Activity  . Alcohol use: No    Alcohol/week: 0.0 standard drinks  . Drug use: No  . Sexual activity: Not on file  Other Topics Concern  . Not on file  Social History Narrative  . Not on file   Social Determinants of Health   Financial Resource Strain: Not on file  Food Insecurity: Not on file  Transportation Needs: Not on file  Physical Activity: Not on file  Stress: Not on file  Social Connections: Not on file    Vitals:   09/13/20 1516  BP: 128/70  Pulse: 69  Resp: 16  SpO2: 98%   Body mass index is 25.89 kg/m.  Physical Exam Vitals and nursing note reviewed.  Constitutional:      General: She is not in acute distress.    Appearance: She is well-developed.  HENT:     Head: Normocephalic and atraumatic.     Mouth/Throat:     Mouth: Mucous membranes are moist.  Eyes:     Conjunctiva/sclera: Conjunctivae normal.  Cardiovascular:     Rate and Rhythm: Normal rate and regular rhythm.     Pulses:          Dorsalis pedis pulses are 2+ on the right side and 2+ on the left side.     Heart sounds: No murmur heard.   Pulmonary:     Effort: Pulmonary effort is normal. No respiratory distress.     Breath sounds: Normal breath sounds.  Abdominal:     Palpations: Abdomen is soft. There is no hepatomegaly or mass.     Tenderness:  There is no abdominal tenderness.  Musculoskeletal:       Hands:     Comments: Some IP joints with Heberden's node (DIP) and Bouchard's nodes (PIP), bilateral. No signs of synovitis.   Lymphadenopathy:     Cervical: No cervical adenopathy.  Skin:    General: Skin is warm.     Findings: No erythema or rash.  Neurological:     Mental Status: She is alert and oriented to person, place, and time.     Cranial Nerves: No cranial nerve deficit.     Gait: Gait normal.    Diabetic Foot Exam - Simple   Simple  Foot Form Diabetic Foot exam was performed with the following findings: Yes 09/13/2020  3:58 PM  Visual Inspection See comments: Yes Sensation Testing See comments: Yes Pulse Check Posterior Tibialis and Dorsalis pulse intact bilaterally: Yes Comments [place]. Decreased monofilament right foot.    ASSESSMENT AND PLAN:  Ms. BAILIE CHRISTENBURY was seen today for 6 months follow-up.  Orders Placed This Encounter  Procedures  . Hemoglobin A1c  . Lipid panel  . CMP   Lab Results  Component Value Date   CHOL 204 (H) 09/13/2020   HDL 55.40 09/13/2020   LDLCALC 136 (H) 09/13/2020   TRIG 60.0 09/13/2020   CHOLHDL 4 09/13/2020   Lab Results  Component Value Date   HGBA1C 6.3 09/13/2020   Lab Results  Component Value Date   CREATININE 0.79 09/13/2020   BUN 15 09/13/2020   NA 141 09/13/2020   K 4.2 09/13/2020   CL 104 09/13/2020   CO2 30 09/13/2020   Lab Results  Component Value Date   ALT 31 09/13/2020   AST 30 09/13/2020   ALKPHOS 118 (H) 09/13/2020   BILITOT 0.5 09/13/2020   Type 2 diabetes mellitus with neurological complications ([place]) [id number] has been at goal. Continue non pharmacologic treatment.  Annual eye exam, periodic dental and foot care recommended. F/U in 5-6 months  -     Accu-Chek Softclix Lancets lancets; Use to check blood sugar daily. Dx:e11.49 -     glucose blood (ACCU-CHEK GUIDE) test strip; Use to check blood sugars daily. Dx:e11.49 -      Blood Glucose Monitoring Suppl (ACCU-CHEK GUIDE ME) w/Device KIT; Use to check blood sugars daily.  Hyperlipidemia associated with type 2 diabetes mellitus ([facility]) Resume Pravastatin 20 mg daily. Low fat diet to continue.  -     pravastatin (PRAVACHOL) 20 MG tablet; Take 1 tablet (20 mg total) by mouth daily.  Gastroesophageal reflux disease without esophagitis Not well controlled with Omeprazole 40 mg, so resume Protonix 40 mg daily. GERD precautions also recommended.  -     pantoprazole (PROTONIX) 40 MG tablet; Take 1 tablet (40 mg total) by mouth daily.  Polyneuropathy associated with underlying disease ([place]) Stable. Gabapentin did not help. She agrees with trying Cymbalta 30 mg daily, some side effects discussed. She prefers to hold on neuro evaluation.  -     DULoxetine (CYMBALTA) 30 MG capsule; Take 1 capsule (30 mg total) by mouth daily.  Primary hypertension BP adequately controlled. Continue non pharmacologic treatment. Low salt diet.  Finger pain, left Examination does not reveal possible causes. Recommend soaking index finger in warm water with [place] salt 1-2 times per day and to monitor for new symptom.  Return in about 4 months (around 01/13/2021) for HLD,DM II,neuropathy.   Emmogene Simson G. [name], MD  [facility]. [place] office.   A few things to remember from today's visit:   Hyperlipidemia associated with type 2 diabetes mellitus ([place]) - Plan: pravastatin (PRAVACHOL) 20 MG tablet  Type 2 diabetes mellitus with neurological complications ([place]) - Plan: Accu-Chek Softclix Lancets lancets  Gastroesophageal reflux disease without esophagitis - Plan: pantoprazole (PROTONIX) 40 MG tablet  Polyneuropathy associated with underlying disease ([place]) - Plan: DULoxetine (CYMBALTA) 30 MG capsule  If you need refills please call your pharmacy. Do not use My Chart to request refills or for acute issues that need immediate attention.   Today we are resuming some of  your meds. Stop Gabapentin. Cymbalta (Duloxetine) started today. Appropriate foot care. If you want to  see neurologist please let me know.  Please be sure medication list is accurate. If a new problem present, please set up appointment sooner than planned today.

## 2020-09-13 NOTE — Patient Instructions (Addendum)
A few things to remember from today's visit:   Hyperlipidemia associated with type 2 diabetes mellitus ([place]) - Plan: pravastatin (PRAVACHOL) 20 MG tablet  Type 2 diabetes mellitus with neurological complications ([place]) - Plan: Accu-Chek Softclix Lancets lancets  Gastroesophageal reflux disease without esophagitis - Plan: pantoprazole (PROTONIX) 40 MG tablet  Polyneuropathy associated with underlying disease ([place]) - Plan: DULoxetine (CYMBALTA) 30 MG capsule  If you need refills please call your pharmacy. Do not use My Chart to request refills or for acute issues that need immediate attention.   Today we are resuming some of your meds. Stop Gabapentin. Cymbalta (Duloxetine) started today. Appropriate foot care. If you want to see neurologist please let me know.  Please be sure medication list is accurate. If a new problem present, please set up appointment sooner than planned today.

## 2020-09-14 LAB — LIPID PANEL
Cholesterol: 204 mg/dL — ABNORMAL HIGH (ref 0–200)
HDL: 55.4 mg/dL (ref >39.00)
LDL Cholesterol: 136 mg/dL — ABNORMAL HIGH (ref 0–99)
NonHDL: 148.47
Total CHOL/HDL Ratio: 4
Triglycerides: 60 mg/dL (ref 0.0–149.0)
VLDL: 12 mg/dL (ref 0.0–40.0)

## 2020-09-14 LAB — COMPREHENSIVE METABOLIC PANEL
ALT: 31 U/L (ref 0–35)
AST: 30 U/L (ref 0–37)
Albumin: 4.3 g/dL (ref 3.5–5.2)
Alkaline Phosphatase: 118 U/L — ABNORMAL HIGH (ref 39–117)
BUN: 15 mg/dL (ref 6–23)
CO2: 30 mEq/L (ref 19–32)
Calcium: 9.5 mg/dL (ref 8.4–10.5)
Chloride: 104 mEq/L (ref 96–112)
Creatinine, Ser: 0.79 mg/dL (ref 0.40–1.20)
GFR: 72.09 mL/min (ref >60.00)
Glucose, Bld: 84 mg/dL (ref 70–99)
Potassium: 4.2 mEq/L (ref 3.5–5.1)
Sodium: 141 mEq/L (ref 135–145)
Total Bilirubin: 0.5 mg/dL (ref 0.2–1.2)
Total Protein: 7.3 g/dL (ref 6.0–8.3)

## 2020-09-14 LAB — HEMOGLOBIN A1C: Hgb A1c MFr Bld: 6.3 % (ref 4.6–6.5)

## 2020-09-17 ENCOUNTER — Encounter: Payer: Self-pay | Admitting: Family Medicine

## 2020-10-04 ENCOUNTER — Telehealth: Payer: Self-pay | Admitting: Pharmacist

## 2020-10-04 NOTE — Chronic Care Management (AMB) (Addendum)
    Chronic Care Management Pharmacy Assistant   Name: CAMIYA VINAL  MRN: [id number] DOB: Apr 02, 1941  Reason for Encounter: Medication Review-Medication Coordination Call   Recent office visits:  04.06.2022 [name], [name], MD_ Follow- up Type 2 diabetes mellitus with neurological complications Medication Changes Duloxetine HCl 30 mg Oral Daily- Started Gabapentin 300 mg Oral Daily at bedtime-discontinued  Recent consult visits:  None  Hospital visits:  None in previous 6 months  Medications: Outpatient Encounter Medications as of 10/04/2020  Medication Sig   Accu-Chek Softclix Lancets lancets Use to check blood sugar daily. Dx:e11.49   aspirin 81 MG tablet Take 81 mg by mouth daily.   Blood Glucose Monitoring Suppl (ACCU-CHEK AVIVA CONNECT) w/Device KIT 1 Device by Does not apply route daily.   Blood Glucose Monitoring Suppl (ACCU-CHEK GUIDE ME) w/Device KIT Use to check blood sugars daily.   Cholecalciferol (VITAMIN D3) 2000 units TABS Take by mouth daily.   Continuous Blood Gluc Receiver (DEXCOM G6 RECEIVER) DEVI Use to check blood sugars up to 3 times daily.   DULoxetine (CYMBALTA) 30 MG capsule Take 1 capsule (30 mg total) by mouth daily.   glucose blood (ACCU-CHEK GUIDE) test strip Use to check blood sugars daily. Dx:e11.49   ibuprofen (ADVIL,MOTRIN) 200 MG tablet Take 200 mg by mouth every 6 (six) hours as needed.   Lancets Misc. (ACCU-CHEK SOFTCLIX LANCET DEV) KIT 1 Device by Does not apply route daily.   latanoprost (XALATAN) 0.005 % ophthalmic solution 1 drop at bedtime. (Patient not taking: No sig reported)   pantoprazole (PROTONIX) 40 MG tablet Take 1 tablet (40 mg total) by mouth daily.   polyethylene glycol powder (GLYCOLAX/MIRALAX) powder MIX 255 GRAM PO ONCE FOR 1 DOSE   pravastatin (PRAVACHOL) 20 MG tablet Take 1 tablet (20 mg total) by mouth daily.   No facility-administered encounter medications on file as of 10/04/2020.   Reviewed chart for medication  changes ahead of medication coordination call.  BP Readings from Last 3 Encounters:  09/13/20 128/70  03/08/20 124/70  02/16/20 110/70    Lab Results  Component Value Date   HGBA1C 6.3 09/13/2020     Patient obtains medications through Adherence Packaging  30 Days  Last adherence delivery included:  Duloxetine (CYMBALTA) 30 mg: one capsule at breakfast Pantoprazole (PROTONIX) 40 mg: one tablet at breakfast Pravastatin (PRAVACHOL) 20 mg: one tablet at breakast Glucose blood (ACCU-CHEK GUIDE) test strip: check blood sugar daily Blood Glucose Monitoring Suppl (ACCU-CHEK GUIDE ME) w/Device KIT Accu-Chek Softclix Lancets lancets: check blood sugar daily Latanoprost (XALATAN) 0.005 % ophthalmic solution  Patient is due for next adherence delivery on: 10/13/2020. Called patient and reviewed medications and coordinated delivery. This delivery to include: Pantoprazole (PROTONIX) 40 mg: one tablet at breakfast Pravastatin (PRAVACHOL) 20 mg: one tablet at breakfast Latanoprost (XALATAN) 0.005 % ophthalmic solution  Patient declined the following medications due to these reason Duloxetine (CYMBALTA) 30 mg: one capsule at breakfast-Patient took medication for 10 days and stopped she stated it was giving her too much gas  Patient needs refills for : Latanoprost (XALATAN) 0.005 % ophthalmic solution  Confirmed delivery date of 10/13/2020, advised patient that pharmacy will contact them the morning of delivery.  Star Rating Drugs:  Dispensed Quantity Pharmacy  Pravastatin 04.07.2022 30 [facility]   [name], [place] [phone]

## 2020-10-24 ENCOUNTER — Telehealth: Payer: Self-pay | Admitting: Family Medicine

## 2020-10-24 NOTE — Telephone Encounter (Signed)
Tried calling patient to  schedule Medicare Annual Wellness Visit (AWV) either virtually or in office.  No answer  Last AWV 06/07/16  please schedule at anytime with [facility] Nurse Health Advisor 1 or 2   This should be a 45 minute visit.

## 2020-11-07 ENCOUNTER — Telehealth: Payer: Self-pay | Admitting: Pharmacist

## 2020-11-07 NOTE — Chronic Care Management (AMB) (Signed)
    Chronic Care Management Pharmacy Assistant   Name: Julie Schmidt  MRN: [id number] DOB: 1940-10-31  Reason for Encounter: Medication Review-Medication Coordination Call   Recent office visits:  None  Recent consult visits:  None  Hospital visits:  None in previous 6 months  Medications: Outpatient Encounter Medications as of 11/07/2020  Medication Sig  . Accu-Chek Softclix Lancets lancets Use to check blood sugar daily. Dx:e11.49  . aspirin 81 MG tablet Take 81 mg by mouth daily.  . Blood Glucose Monitoring Suppl (ACCU-CHEK AVIVA CONNECT) w/Device KIT 1 Device by Does not apply route daily.  . Blood Glucose Monitoring Suppl (ACCU-CHEK GUIDE ME) w/Device KIT Use to check blood sugars daily.  . Cholecalciferol (VITAMIN D3) 2000 units TABS Take by mouth daily.  . Continuous Blood Gluc Receiver (DEXCOM G6 RECEIVER) DEVI Use to check blood sugars up to 3 times daily.  . DULoxetine (CYMBALTA) 30 MG capsule Take 1 capsule (30 mg total) by mouth daily.  Marland Kitchen glucose blood (ACCU-CHEK GUIDE) test strip Use to check blood sugars daily. Dx:e11.49  . ibuprofen (ADVIL,MOTRIN) 200 MG tablet Take 200 mg by mouth every 6 (six) hours as needed.  . Lancets Misc. (ACCU-CHEK SOFTCLIX LANCET DEV) KIT 1 Device by Does not apply route daily.  Marland Kitchen latanoprost (XALATAN) 0.005 % ophthalmic solution 1 drop at bedtime. (Patient not taking: No sig reported)  . pantoprazole (PROTONIX) 40 MG tablet Take 1 tablet (40 mg total) by mouth daily.  . polyethylene glycol powder (GLYCOLAX/MIRALAX) powder MIX 255 GRAM PO ONCE FOR 1 DOSE  . pravastatin (PRAVACHOL) 20 MG tablet Take 1 tablet (20 mg total) by mouth daily.   No facility-administered encounter medications on file as of 11/07/2020.    Reviewed chart for medication changes ahead of medication coordination call.   BP Readings from Last 3 Encounters:  09/13/20 128/70  03/08/20 124/70  02/16/20 110/70    Lab Results  Component Value Date   HGBA1C 6.3  09/13/2020     Patient obtains medications through Adherence Packaging  90 Days   Last adherence delivery included:  . Pantoprazole (PROTONIX) 40 mg: one tablet at breakfast . Pravastatin (PRAVACHOL) 20 mg: one tablet at breakfast . Latanoprost (XALATAN) 0.005 % ophthalmic solution . Dorzolamide 2/0.005 % eye drop  Patient declined the following medication last month due to PRN use/additional supply on hand. . Duloxetine (CYMBALTA) 30 mg: one capsule at breakfast  Patient is due for next adherence delivery on: 11/14/2020. Called patient and reviewed medications and coordinated delivery.  This delivery to include: . Pantoprazole (PROTONIX) 40 mg: one tablet at breakfast . Pravastatin (PRAVACHOL) 20 mg: one tablet at breakfast . Latanoprost (XALATAN) 0.005 % ophthalmic solution . Dorzolamide 2/0.005 % eye drop  Patient needs refills for: Marland Kitchen Dorzolamide 2/0.005 % eye drop  Confirmed delivery date of 11/14/2020, advised patient that pharmacy will contact them the morning of delivery.  The patient needed an eye drop that she has been taking. She stated she was not able to remember the name last month and she requested it for this month. A refill request was sent to her eye doctor (Dr. Katy Schmidt) for this medication.  Star Rating Drugs:  Dispensed Quantity Pharmacy  Pravastatin 20 mg 05.03.2022 30 [facility]   Julie Schmidt, [place] Pharmacist Assistant [phone]

## 2020-11-10 ENCOUNTER — Telehealth: Payer: Self-pay | Admitting: Pharmacist

## 2020-11-10 NOTE — Chronic Care Management (AMB) (Signed)
    Chronic Care Management Pharmacy Assistant   Name: Julie Schmidt  MRN: [id number] DOB: 09-18-1940  11/10/20- Called patient to remind of appointment with Jeni Salles) on (11/13/20 in person at 4pm)   No answer, unable to leave message of appointment date, time and type of appointment (either telephone or in person). Unable to leave message to have all medications, supplements, blood pressure and/or blood sugar logs available during appointment and to return call if needed.   Two number listed not in service and or unavailable.   [place]  Clinical Pharmacist Assistant [phone]

## 2020-11-13 ENCOUNTER — Ambulatory Visit: Payer: Medicare Other

## 2020-11-13 NOTE — Progress Notes (Deleted)
Chronic Care Management Pharmacy Note  11/13/2020 Name:  Julie Schmidt MRN:  [id number] DOB:  1941-01-14  Subjective: Julie Schmidt is an 80 y.o. year old female who is a primary patient of [name], Malka So, MD.  The CCM team was consulted for assistance with disease management and care coordination needs.    Engaged with patient face to face for follow up visit in response to provider referral for pharmacy case management and/or care coordination services.   Consent to Services:  The patient was given information about Chronic Care Management services, agreed to services, and gave verbal consent prior to initiation of services.  Please see initial visit note for detailed documentation.   Patient Care Team: [name], [name], MD as PCP - General (Family Medicine) Viona Gilmore, [facility] as Pharmacist (Pharmacist)  Recent office visits: 09/13/20 Betty [name], MD: Patient presented for chronic conditions follow up. Started duloxetine for neuropathy/pain to replace gabapentin.  03/08/20 Betty [name], MD: Patient presented for chronic conditions follow up. Resent glucometer rx for patient to check BGs at home. Recommended to decrease pantoprazole frequency to every 2 days and eventually prn if symptoms still well controlled.   02/16/20 Betty [name], MD - Patient presented for follow up with questions about recent ultrasound results. Ultrasound showed two simple hepatic cysts. Patient is also upset because she did not receive Dexcom or Freestyle devices. Flu vaccine was given and EKG ordered. Follow up in 2 weeks.  Recent consult visits: none  Hospital visits: None in previous 6 months  Objective:  Lab Results  Component Value Date   CREATININE 0.79 09/13/2020   BUN 15 09/13/2020   GFR 72.09 09/13/2020   GFRNONAA >60 06/21/2016   GFRAA >60 06/21/2016   NA 141 09/13/2020   K 4.2 09/13/2020   CALCIUM 9.5 09/13/2020   CO2 30 09/13/2020   GLUCOSE 84 09/13/2020    Lab Results   Component Value Date/Time   HGBA1C 6.3 09/13/2020 04:28 PM   HGBA1C 6.3 (H) 03/08/2020 04:02 PM   GFR 72.09 09/13/2020 04:28 PM   GFR 78.52 12/07/2019 02:39 PM   MICROALBUR 1.3 12/07/2019 02:39 PM   MICROALBUR 2.2 (H) 06/08/2019 11:33 AM    Last diabetic Eye exam:  Lab Results  Component Value Date/Time   HMDIABEYEEXA No Retinopathy 07/02/2016 12:00 AM    Last diabetic Foot exam: No results found for: HMDIABFOOTEX   Lab Results  Component Value Date   CHOL 204 (H) 09/13/2020   HDL 55.40 09/13/2020   LDLCALC 136 (H) 09/13/2020   TRIG 60.0 09/13/2020   CHOLHDL 4 09/13/2020    Hepatic Function Latest Ref Rng & Units 09/13/2020 02/01/2020 12/07/2019  Total Protein 6.0 - 8.3 g/dL 7.3 7.5 7.1  Albumin 3.5 - 5.2 g/dL 4.3 - 4.5  AST 0 - 37 U/L _0 ALT 0 - 35 U/L _1 Alk Phosphatase 39 - 117 U/L 118(H) - 97  Total Bilirubin 0.2 - 1.2 mg/dL 0.5 0.4 0.4  Bilirubin, Direct 0.0 - 0.2 mg/dL - 0.1 -    Lab Results  Component Value Date/Time   TSH 1.47 05/07/2016 04:41 PM    CBC Latest Ref Rng & Units 02/01/2020 06/21/2016 05/07/2016  WBC 3.8 - 10.8 Thousand/uL 5.4 5.8 5.9  Hemoglobin 11.7 - 15.5 g/dL 12.1 12.3 12.2  Hematocrit 35.0 - 45.0 % 36.1 38.0 36.1  Platelets 140 - 400 Thousand/uL 297 303 287.0    No results found for: VD25OH  Clinical  ASCVD: No  The 10-year ASCVD risk score Mikey Bussing DC Jr., et al., 2013) is: 40.3%   Values used to calculate the score:     Age: 62 years     Sex: Female     Is Non-Hispanic African American: Yes     Diabetic: Yes     Tobacco smoker: No     Systolic Blood Pressure: 347 mmHg     Is BP treated: No     HDL Cholesterol: 55.4 mg/dL     Total Cholesterol: 204 mg/dL    Depression screen [facility] 2/9 03/12/2020 06/11/2019 01/14/2018  Decreased Interest 0 0 0  Down, Depressed, Hopeless 0 0 0  PHQ - 2 Score 0 0 0      Social History   Tobacco Use  Smoking Status Never Smoker  Smokeless Tobacco Never Used   BP Readings from Last 3  Encounters:  09/13/20 128/70  03/08/20 124/70  02/16/20 110/70   Pulse Readings from Last 3 Encounters:  09/13/20 69  03/08/20 71  02/16/20 67   Wt Readings from Last 3 Encounters:  03/08/20 137 lb (62.1 kg)  02/16/20 139 lb (63 kg)  02/01/20 134 lb 9.6 oz (61.1 kg)   BMI Readings from Last 3 Encounters:  09/13/20 25.89 kg/m  03/08/20 25.89 kg/m  02/16/20 26.26 kg/m    Assessment/Interventions: Review of patient past medical history, allergies, medications, health status, including review of consultants reports, laboratory and other test data, was performed as part of comprehensive evaluation and provision of chronic care management services.   SDOH:  (Social Determinants of Health) assessments and interventions performed: No  SDOH Screenings   Alcohol Screen: Not on file  Depression (PHQ2-9): Low Risk   . PHQ-2 Score: 0  Financial Resource Strain: Not on file  Food Insecurity: Not on file  Housing: Not on file  Physical Activity: Not on file  Social Connections: Not on file  Stress: Not on file  Tobacco Use: Low Risk   . Smoking Tobacco Use: Never Smoker  . Smokeless Tobacco Use: Never Used  Transportation Needs: Not on file    CCM Care Plan  Allergies  Allergen Reactions  . Norvasc [Amlodipine] Other (See Comments)    Dizziness and Chest pains at night when lies down  . Oxycodone-Acetaminophen Nausea And Vomiting    Medications Reviewed Today    Reviewed by [name], [name], MD (Physician) on 09/13/20 at [place] List Status: <None>  Medication Order Taking? Sig Documenting Provider Last Dose Status Informant  Accu-Chek Softclix Lancets lancets [id number]  Use to check blood sugar daily. Dx:e11.49 [name], [name], MD  Active   aspirin 81 MG tablet [id number] Yes Take 81 mg by mouth daily. [provider] Taking Active Self  Blood Glucose Monitoring Suppl (ACCU-CHEK AVIVA CONNECT) w/Device KIT [id number] Yes 1 Device by Does not apply route daily.  [name], [name], MD Taking Active   Blood Glucose Monitoring Suppl (ACCU-CHEK GUIDE ME) w/Device KIT [id number] Yes Use to check blood sugars daily. [name], [name], MD  Active         Discontinued 09/13/20 1602   Cholecalciferol (VITAMIN D3) 2000 units TABS [id number] Yes Take by mouth daily. [provider] Taking Active Self  Continuous Blood Gluc Receiver ([place]) [place] [id number] Yes Use to check blood sugars up to 3 times daily. [name], [name], MD Taking Active         Discontinued 09/13/20 1555   glucose blood (ACCU-CHEK GUIDE) test  strip [id number] Yes Use to check blood sugars daily. Dx:e11.49 [name], [name], MD  Active   ibuprofen (ADVIL,MOTRIN) 200 MG tablet [id number] Yes Take 200 mg by mouth every 6 (six) hours as needed. [provider] Taking Active Self  Lancets Misc. (ACCU-CHEK SOFTCLIX LANCET DEV) KIT [id number] Yes 1 Device by Does not apply route daily. [name], [name], MD Taking Active   latanoprost (XALATAN) 0.005 % ophthalmic solution [id number] No 1 drop at bedtime.  Patient not taking: No sig reported   [provider] Not Taking Active   pantoprazole (PROTONIX) 40 MG tablet [id number] No Take 1 tablet (40 mg total) by mouth daily.  Patient not taking: No sig reported   [name], [name], MD Not Taking Active   polyethylene glycol powder (GLYCOLAX/MIRALAX) powder [id number] Yes MIX 255 GRAM PO ONCE FOR 1 DOSE [provider] Taking Active   pravastatin (PRAVACHOL) 20 MG tablet [id number] No Take 1 tablet (20 mg total) by mouth daily.  Patient not taking: No sig reported   [name], [name], MD Not Taking Active           Patient Active Problem List   Diagnosis Date Noted  . GERD (gastroesophageal reflux disease) 12/07/2019  . Generalized osteoarthritis of multiple sites 04/03/2018  . Hyperlipidemia associated with type 2 diabetes mellitus ([place]) 01/14/2018  . Primary hypertension 05/07/2016  . Abnormal nuclear stress test  11/25/2014  . Abnormal EKG 11/02/2014  . Prolonged Q-T interval on ECG 11/02/2014  . Diet-controlled diabetes mellitus ([place]) 11/02/2014  . Helicobacter pylori gastritis 09/18/2014  . Type 2 diabetes mellitus with neurological complications ([place]) 43/15/4008  . Meningioma ([place]) 05/19/2014  . Back pain, chronic 10/25/2011    Immunization History  Administered Date(s) Administered  . Fluad Quad(high Dose 65+) 06/08/2019, 02/16/2020  . Hepatitis A, Adult 04/03/2018  . Influenza, High Dose Seasonal PF 04/23/2016  . Influenza-Unspecified 03/29/2014  . PFIZER(Purple Top)SARS-COV-2 Vaccination 06/28/2019, 07/19/2019, 03/03/2020  . Pneumococcal Conjugate-13 01/30/2015, 03/13/2018  . Pneumococcal Polysaccharide-23 06/07/2016    Conditions to be addressed/monitored:  Hypertension, Hyperlipidemia, GERD and Neuropathy, constipation and ASCVD prevention  There are no care plans that you recently modified to display for this patient.   Add preDM  Duloxetine not helping?!  Medication Assistance: None required.  Patient affirms current coverage meets needs.  Patient's preferred pharmacy is:  [facility] [place], [place] - [address] AT [place] [address] Phone: [phone] Fax: [phone]  [id number] - [place], [place] - [address] AT [address] [place] [address] Phone: [phone] Fax: [phone]  [facility] - [place], [place] - [address] [place] [address] Phone: [phone] Fax: [phone]  Uses pill box? Yes - adherence packaging Pt endorses 100% compliance   We discussed: Benefits of medication synchronization, packaging and delivery as well as enhanced pharmacist oversight with [facility]. Patient decided to: Utilize [facility] pharmacy for medication  synchronization, packaging and delivery  Care Plan and Follow Up Patient Decision:  Patient agrees to Care Plan and Follow-up.  Plan: Telephone follow up appointment with care management team member scheduled for:  6 months  Jeni Salles, PharmD [place] Pharmacist [place] at [place] [phone]

## 2020-11-28 DIAGNOSIS — E119 Type 2 diabetes mellitus without complications: Secondary | ICD-10-CM | POA: Diagnosis not present

## 2020-11-28 DIAGNOSIS — H26492 Other secondary cataract, left eye: Secondary | ICD-10-CM | POA: Diagnosis not present

## 2020-11-28 DIAGNOSIS — Z961 Presence of intraocular lens: Secondary | ICD-10-CM | POA: Diagnosis not present

## 2020-11-28 DIAGNOSIS — H401112 Primary open-angle glaucoma, right eye, moderate stage: Secondary | ICD-10-CM | POA: Diagnosis not present

## 2020-11-28 DIAGNOSIS — H401121 Primary open-angle glaucoma, left eye, mild stage: Secondary | ICD-10-CM | POA: Diagnosis not present

## 2020-12-01 ENCOUNTER — Telehealth: Payer: Self-pay | Admitting: Pharmacist

## 2020-12-01 NOTE — Chronic Care Management (AMB) (Signed)
Date- Patient called to remind of appointment with Watt Climes on December 04, 2020 at 4:30 pm  No answer, l was not able to leave message of appointment date, time and type of appointment ( in person). Left message to have all medications, supplements, blood pressure and/or blood sugar logs available during appointment and to return call if need to reschedule.  Star Rating Drug: Medication Dispensed  Quantity Pharmacy  Pravastatin 40 mg 06.03.2022 [facility]    Any gaps in medications fill history?  Maia Breslow, [place] Pharmacist Assistant [phone]

## 2020-12-04 ENCOUNTER — Ambulatory Visit (INDEPENDENT_AMBULATORY_CARE_PROVIDER_SITE_OTHER): Payer: Medicare Other | Admitting: Pharmacist

## 2020-12-04 ENCOUNTER — Other Ambulatory Visit: Payer: Self-pay

## 2020-12-04 DIAGNOSIS — E1149 Type 2 diabetes mellitus with other diabetic neurological complication: Secondary | ICD-10-CM

## 2020-12-04 DIAGNOSIS — E785 Hyperlipidemia, unspecified: Secondary | ICD-10-CM

## 2020-12-04 DIAGNOSIS — E1169 Type 2 diabetes mellitus with other specified complication: Secondary | ICD-10-CM

## 2020-12-04 NOTE — Progress Notes (Signed)
Chronic Care Management Pharmacy Note  12/04/2020 Name:  Julie Schmidt MRN:  [id number] DOB:  November 20, 1940  Summary: Patient did not know how to use glucometer Patient stopped taking duloxetine due to questionable effectiveness  Recommendations/Changes made from today's visit: -Recommended for patient to restart duloxetine -Educated on how to use a glucometer and recommended routine monitoring at home  Plan: -Coordinate 30 day fill for duloxetine for patient to re-trial -Follow up in 6 months  Subjective: Julie Schmidt is an 80 y.o. year old female who is a primary patient of [name], Malka So, MD.  The CCM team was consulted for assistance with disease management and care coordination needs.    Engaged with patient face to face for follow up visit in response to provider referral for pharmacy case management and/or care coordination services.   Consent to Services:  The patient was given information about Chronic Care Management services, agreed to services, and gave verbal consent prior to initiation of services.  Please see initial visit note for detailed documentation.   Patient Care Team: [name], [name], MD as PCP - General (Family Medicine) Viona Gilmore, [facility] as Pharmacist (Pharmacist)  Recent office visits: 09/13/20 Betty [name], MD: Patient presented for chronic conditions follow up. Started duloxetine for neuropathy/pain to replace gabapentin.  03/08/20 Betty [name], MD: Patient presented for chronic conditions follow up. Resent glucometer rx for patient to check BGs at home. Recommended to decrease pantoprazole frequency to every 2 days and eventually prn if symptoms still well controlled.   02/16/20 Betty [name], MD - Patient presented for follow up with questions about recent ultrasound results. Ultrasound showed two simple hepatic cysts. Patient is also upset because she did not receive Dexcom or Freestyle devices. Flu vaccine was given and EKG ordered. Follow up in 2  weeks.  Recent consult visits: none  Hospital visits: None in previous 6 months  Objective:  Lab Results  Component Value Date   CREATININE 0.79 09/13/2020   BUN 15 09/13/2020   GFR 72.09 09/13/2020   GFRNONAA >60 06/21/2016   GFRAA >60 06/21/2016   NA 141 09/13/2020   K 4.2 09/13/2020   CALCIUM 9.5 09/13/2020   CO2 30 09/13/2020   GLUCOSE 84 09/13/2020    Lab Results  Component Value Date/Time   HGBA1C 6.3 09/13/2020 04:28 PM   HGBA1C 6.3 (H) 03/08/2020 04:02 PM   GFR 72.09 09/13/2020 04:28 PM   GFR 78.52 12/07/2019 02:39 PM   MICROALBUR 1.3 12/07/2019 02:39 PM   MICROALBUR 2.2 (H) 06/08/2019 11:33 AM    Last diabetic Eye exam:  Lab Results  Component Value Date/Time   HMDIABEYEEXA No Retinopathy 07/02/2016 12:00 AM    Last diabetic Foot exam: No results found for: HMDIABFOOTEX   Lab Results  Component Value Date   CHOL 204 (H) 09/13/2020   HDL 55.40 09/13/2020   LDLCALC 136 (H) 09/13/2020   TRIG 60.0 09/13/2020   CHOLHDL 4 09/13/2020    Hepatic Function Latest Ref Rng & Units 09/13/2020 02/01/2020 12/07/2019  Total Protein 6.0 - 8.3 g/dL 7.3 7.5 7.1  Albumin 3.5 - 5.2 g/dL 4.3 - 4.5  AST 0 - 37 U/L _0 ALT 0 - 35 U/L _1 Alk Phosphatase 39 - 117 U/L 118(H) - 97  Total Bilirubin 0.2 - 1.2 mg/dL 0.5 0.4 0.4  Bilirubin, Direct 0.0 - 0.2 mg/dL - 0.1 -    Lab Results  Component Value Date/Time   TSH 1.47 05/07/2016  04:41 PM    CBC Latest Ref Rng & Units 02/01/2020 06/21/2016 05/07/2016  WBC 3.8 - 10.8 Thousand/uL 5.4 5.8 5.9  Hemoglobin 11.7 - 15.5 g/dL 12.1 12.3 12.2  Hematocrit 35.0 - 45.0 % 36.1 38.0 36.1  Platelets 140 - 400 Thousand/uL 297 303 287.0    No results found for: VD25OH  Clinical ASCVD: No  The 10-year ASCVD risk score Mikey Bussing DC Jr., et al., 2013) is: 36.3%   Values used to calculate the score:     Age: 80 years     Sex: Female     Is Non-Hispanic African American: Yes     Diabetic: Yes     Tobacco smoker: No      Systolic Blood Pressure: 235 mmHg     Is BP treated: Yes     HDL Cholesterol: 55.4 mg/dL     Total Cholesterol: 204 mg/dL    Depression screen [employer] 2/9 03/12/2020 06/11/2019 01/14/2018  Decreased Interest 0 0 0  Down, Depressed, Hopeless 0 0 0  PHQ - 2 Score 0 0 0      Social History   Tobacco Use  Smoking Status Never  Smokeless Tobacco Never   BP Readings from Last 3 Encounters:  09/13/20 128/70  03/08/20 124/70  02/16/20 110/70   Pulse Readings from Last 3 Encounters:  09/13/20 69  03/08/20 71  02/16/20 67   Wt Readings from Last 3 Encounters:  03/08/20 137 lb (62.1 kg)  02/16/20 139 lb (63 kg)  02/01/20 134 lb 9.6 oz (61.1 kg)   BMI Readings from Last 3 Encounters:  09/13/20 25.89 kg/m  03/08/20 25.89 kg/m  02/16/20 26.26 kg/m    Assessment/Interventions: Review of patient past medical history, allergies, medications, health status, including review of consultants reports, laboratory and other test data, was performed as part of comprehensive evaluation and provision of chronic care management services.   SDOH:  (Social Determinants of Health) assessments and interventions performed: No  SDOH Screenings   Alcohol Screen: Not on file  Depression (PHQ2-9): Low Risk    PHQ-2 Score: 0  Financial Resource Strain: Not on file  Food Insecurity: Not on file  Housing: Not on file  Physical Activity: Not on file  Social Connections: Not on file  Stress: Not on file  Tobacco Use: Low Risk    Smoking Tobacco Use: Never   Smokeless Tobacco Use: Never  Transportation Needs: Not on file    [place]  Allergies  Allergen Reactions   Norvasc [Amlodipine] Other (See Comments)    Dizziness and Chest pains at night when lies down   Oxycodone-Acetaminophen Nausea And Vomiting    Medications Reviewed Today     Reviewed by [name], [name], MD (Physician) on 09/13/20 at [place] List Status: <None>   Medication Order Taking? Sig Documenting Provider Last Dose  Status Informant  Accu-Chek Softclix Lancets lancets [id number]  Use to check blood sugar daily. Dx:e11.49 [name], [name], MD  Active   aspirin 81 MG tablet [id number] Yes Take 81 mg by mouth daily. [provider] Taking Active Self  Blood Glucose Monitoring Suppl (ACCU-CHEK AVIVA CONNECT) w/Device KIT [id number] Yes 1 Device by Does not apply route daily. [name], [name], MD Taking Active   Blood Glucose Monitoring Suppl (ACCU-CHEK GUIDE ME) w/Device KIT [id number] Yes Use to check blood sugars daily. [name], [name], MD  Active   Discontinued 09/13/20 1602   Cholecalciferol (VITAMIN D3) 2000 units TABS [id number] Yes Take by mouth  daily. [provider] Taking Active Self  Continuous Blood Gluc Receiver ([place]) [place] [id number] Yes Use to check blood sugars up to 3 times daily. [name], [name], MD Taking Active   Discontinued 09/13/20 1555   glucose blood (ACCU-CHEK GUIDE) test strip [id number] Yes Use to check blood sugars daily. Dx:e11.49 [name], [name], MD  Active   ibuprofen (ADVIL,MOTRIN) 200 MG tablet [id number] Yes Take 200 mg by mouth every 6 (six) hours as needed. [provider] Taking Active Self  Lancets Misc. (ACCU-CHEK SOFTCLIX LANCET DEV) KIT [id number] Yes 1 Device by Does not apply route daily. [name], [name], MD Taking Active   latanoprost (XALATAN) 0.005 % ophthalmic solution [id number] No 1 drop at bedtime.  Patient not taking: No sig reported   [provider] Not Taking Active   pantoprazole (PROTONIX) 40 MG tablet [id number] No Take 1 tablet (40 mg total) by mouth daily.  Patient not taking: No sig reported   [name], [name], MD Not Taking Active   polyethylene glycol powder (GLYCOLAX/MIRALAX) powder [id number] Yes MIX 255 GRAM PO ONCE FOR 1 DOSE [provider] Taking Active   pravastatin (PRAVACHOL) 20 MG tablet [id number] No Take 1 tablet (20 mg total) by mouth daily.  Patient not taking: No sig reported   [name], Betty  G, MD Not Taking Active             Patient Active Problem List   Diagnosis Date Noted   GERD (gastroesophageal reflux disease) 12/07/2019   Generalized osteoarthritis of multiple sites 04/03/2018   Hyperlipidemia associated with type 2 diabetes mellitus ([place]) 01/14/2018   Primary hypertension 05/07/2016   Abnormal nuclear stress test 11/25/2014   Abnormal EKG 11/02/2014   Prolonged Q-T interval on ECG 11/02/2014   Diet-controlled diabetes mellitus ([place]) 02/54/2706   Helicobacter pylori gastritis 09/18/2014   Type 2 diabetes mellitus with neurological complications ([place]) 23/76/2831   Meningioma ([place]) 05/19/2014   Back pain, chronic 10/25/2011    Immunization History  Administered Date(s) Administered   Fluad Quad(high Dose 65+) 06/08/2019, 02/16/2020   Hepatitis A, Adult 04/03/2018   Influenza, High Dose Seasonal PF 04/23/2016   Influenza-Unspecified 03/29/2014   PFIZER(Purple Top)SARS-COV-2 Vaccination 06/28/2019, 07/19/2019, 03/03/2020   Pneumococcal Conjugate-13 01/30/2015, 03/13/2018   Pneumococcal Polysaccharide-23 06/07/2016    Conditions to be addressed/monitored:  Hypertension, Hyperlipidemia, GERD and Neuropathy, constipation and ASCVD prevention  Care Plan : [place]  Updates made by Viona Gilmore, [place] since 12/04/2020 12:00 AM     Problem: Problem: Hypertension, Hyperlipidemia, GERD and Neuropathy, constipation and ASCVD prevention      Long-Range Goal: Patient-Specific Goal   Start Date: 09/13/2020  Expected End Date: 09/13/2021  Recent Progress: On track  Priority: High  Note:   Current Barriers:  Unable to independently monitor therapeutic efficacy Unable to self administer medications as prescribed  Pharmacist Clinical Goal(s):  Patient will achieve adherence to monitoring guidelines and medication adherence to achieve therapeutic efficacy through collaboration with PharmD and provider.   Interventions: 1:1 collaboration with  [name], [name], MD regarding development and update of comprehensive plan of care as evidenced by provider attestation and co-signature Inter-disciplinary care team collaboration (see longitudinal plan of care) Comprehensive medication review performed; medication list updated in electronic medical record  Hypertension (BP goal <140/90) -Controlled -Current treatment: No medications -Medications previously tried: amlodipine (dizziness and chest pain)  -Current home readings: does not check at home -Current dietary habits: limits salt intake -Current exercise habits:  limited exercise -Denies hypotensive/hypertensive symptoms -Educated on Symptoms of hypotension and importance of maintaining adequate hydration; -Counseled to monitor BP at home when symptomatic, document, and provide log at future appointments -Counseled on diet and exercise extensively  Hyperlipidemia: (LDL goal < 100) -Controlled -Current treatment: Pravastatin 20 mg 1 tablet daily -Medications previously tried: none  -Current dietary patterns: does not fry foods often -Current exercise habits: no structured exercise -Educated on Cholesterol goals;  Importance of limiting foods high in cholesterol; -Counseled on diet and exercise extensively Recommended to continue current medication  Diabetes (A1c goal <7%) -Controlled -Current medications: No medications -Medications previously tried: none  -Current home glucose readings fasting glucose: does not have testing supplies for glucometer post prandial glucose: does not have testing supplies for glucometer -Denies hypoglycemic/hyperglycemic symptoms -Current meal patterns:  breakfast: did not discuss  lunch: did not discuss  dinner: did not discuss  snacks: did not discuss  drinks: did not discuss  -Current exercise: limited exercise -Educated on Benefits of routine self-monitoring of blood sugar; -Counseled to check feet daily and get yearly eye  exams -Counseled on diet and exercise extensively Educated on how to use current glucometer and went through step by step process of checking blood sugars at home.  GERD (Goal: minimize symptoms) -Controlled -Current treatment  Pantoprazole 20 mg 1 capsule daily -Medications previously tried:  -Recommended to continue current medication  Nerve pain (Goal: minimize symptoms of neuropathy) -Uncontrolled -Current treatment  Duloxetine 30 mg 1 capsule daily - not taking because she was unsure of effectiveness -Medications previously tried: gabapentin (not effective) -Recommended to restart taking to see if it does provide benefit  Health Maintenance -Vaccine gaps: shingles (did both at pharmacy) -Current therapy:  Aspirin 81 mg 1 tablet daily Ibuprofen 200 mg 1 tablet as needed (takes about once a week)  Vitamin D3 2000 units daily -Educated on Cost vs benefit of each product must be carefully weighed by individual consumer -Patient is satisfied with current therapy and denies issues -Recommended to continue current medication  Patient Goals/Self-Care Activities Patient will:  - take medications as prescribed check glucose as directed, document, and provide at future appointments target a minimum of 150 minutes of moderate intensity exercise weekly  Follow Up Plan: Telephone follow up appointment with care management team member scheduled for: 6 months      Patient Care Plan: CCM Pharmacy Care Plan     Problem Identified: Problem: Hypertension, Hyperlipidemia, GERD and Neuropathy, constipation and ASCVD prevention      Long-Range Goal: Patient-Specific Goal   Start Date: 09/13/2020  Expected End Date: 09/13/2021  Recent Progress: On track  Priority: High  Note:   Current Barriers:  Unable to independently monitor therapeutic efficacy Unable to self administer medications as prescribed  Pharmacist Clinical Goal(s):  Patient will achieve adherence to monitoring  guidelines and medication adherence to achieve therapeutic efficacy through collaboration with PharmD and provider.   Interventions: 1:1 collaboration with [name], [name], MD regarding development and update of comprehensive plan of care as evidenced by provider attestation and co-signature Inter-disciplinary care team collaboration (see longitudinal plan of care) Comprehensive medication review performed; medication list updated in electronic medical record  Hypertension (BP goal <140/90) -Controlled -Current treatment: No medications -Medications previously tried: amlodipine (dizziness and chest pain)  -Current home readings: does not check at home -Current dietary habits: limits salt intake -Current exercise habits: limited exercise -Denies hypotensive/hypertensive symptoms -Educated on Symptoms of hypotension and importance of maintaining adequate hydration; -Counseled to monitor  BP at home when symptomatic, document, and provide log at future appointments -Counseled on diet and exercise extensively  Hyperlipidemia: (LDL goal < 100) -Controlled -Current treatment: Pravastatin 20 mg 1 tablet daily -Medications previously tried: none  -Current dietary patterns: does not fry foods often -Current exercise habits: no structured exercise -Educated on Cholesterol goals;  Importance of limiting foods high in cholesterol; -Counseled on diet and exercise extensively Recommended to continue current medication  Diabetes (A1c goal <7%) -Controlled -Current medications: No medications -Medications previously tried: none  -Current home glucose readings fasting glucose: does not have testing supplies for glucometer post prandial glucose: does not have testing supplies for glucometer -Denies hypoglycemic/hyperglycemic symptoms -Current meal patterns:  breakfast: did not discuss  lunch: did not discuss  dinner: did not discuss  snacks: did not discuss  drinks: did not discuss   -Current exercise: limited exercise -Educated on Benefits of routine self-monitoring of blood sugar; -Counseled to check feet daily and get yearly eye exams -Counseled on diet and exercise extensively Educated on how to use current glucometer and went through step by step process of checking blood sugars at home.  GERD (Goal: minimize symptoms) -Controlled -Current treatment  Pantoprazole 20 mg 1 capsule daily -Medications previously tried:  -Recommended to continue current medication  Nerve pain (Goal: minimize symptoms of neuropathy) -Uncontrolled -Current treatment  Duloxetine 30 mg 1 capsule daily - not taking because she was unsure of effectiveness -Medications previously tried: gabapentin (not effective) -Recommended to restart taking to see if it does provide benefit  Health Maintenance -Vaccine gaps: shingles (did both at pharmacy) -Current therapy:  Aspirin 81 mg 1 tablet daily Ibuprofen 200 mg 1 tablet as needed (takes about once a week)  Vitamin D3 2000 units daily -Educated on Cost vs benefit of each product must be carefully weighed by individual consumer -Patient is satisfied with current therapy and denies issues -Recommended to continue current medication  Patient Goals/Self-Care Activities Patient will:  - take medications as prescribed check glucose as directed, document, and provide at future appointments target a minimum of 150 minutes of moderate intensity exercise weekly  Follow Up Plan: Telephone follow up appointment with care management team member scheduled for: 6 months      Medication Assistance: None required.  Patient affirms current coverage meets needs.  Compliance/Adherence/Medication fill history: Care Gaps: Shingrix, DEXA, eye exam, COVID booster  [facility]: Pravastatin 40 mg - last filled 11/10/20 for 90 ds at [facility]  Patient's preferred pharmacy is:  [facility] [place], [address] AT  [address] [place] [address] Phone: [phone] Fax: [phone]  [id number] - [place], [place] - [address] AT [address] [place] [address] Phone: [phone] Fax: [phone]  [facility] - [place], [place] - [address] [place] [address] Phone: [phone] Fax: [phone]  Uses pill box? Yes - adherence packaging Pt endorses 100% compliance   We discussed: Benefits of medication synchronization, packaging and delivery as well as enhanced pharmacist oversight with [facility]. Patient decided to: Utilize [facility] pharmacy for medication synchronization, packaging and delivery  Care Plan and Follow Up Patient Decision:  Patient agrees to Care Plan and Follow-up.  Plan: Telephone follow up appointment with care management team member scheduled for:  6 months  Jeni Salles, PharmD [facility] Clinical Pharmacist  Therapist, music at [place]

## 2020-12-18 ENCOUNTER — Telehealth: Payer: Self-pay | Admitting: Family Medicine

## 2020-12-18 DIAGNOSIS — R030 Elevated blood-pressure reading, without diagnosis of hypertension: Secondary | ICD-10-CM | POA: Diagnosis not present

## 2020-12-18 DIAGNOSIS — G8929 Other chronic pain: Secondary | ICD-10-CM | POA: Diagnosis not present

## 2020-12-18 DIAGNOSIS — D329 Benign neoplasm of meninges, unspecified: Secondary | ICD-10-CM | POA: Diagnosis not present

## 2020-12-18 DIAGNOSIS — R519 Headache, unspecified: Secondary | ICD-10-CM | POA: Diagnosis not present

## 2020-12-18 NOTE — Telephone Encounter (Signed)
Left message for patient to call back and schedule Medicare Annual Wellness Visit (AWV) either virtually or in office.   Last AWV 06/07/16 please schedule at anytime with [facility] Nurse Health Advisor 1 or 2   This should be a 45 minute visit.

## 2020-12-22 ENCOUNTER — Telehealth: Payer: Self-pay | Admitting: Family Medicine

## 2020-12-22 NOTE — Telephone Encounter (Signed)
Patient has an appointment on Monday.

## 2020-12-22 NOTE — Telephone Encounter (Signed)
I left patient's daughter a detailed voicemail with the information below.

## 2020-12-22 NOTE — Telephone Encounter (Signed)
Continue monitoring BP and have readings ready for Monday's visit. If headache,CP,SOB,palpitations,or MS changes she needs to go to the ER. Thanks, BJ

## 2020-12-22 NOTE — Telephone Encounter (Signed)
Pts daughter is calling in stating that her blood pressure is running a little high and wanted to see if she could get some medication for it.  Pt made a virtual appointment for Monday  12/25/2020.

## 2020-12-25 ENCOUNTER — Telehealth (INDEPENDENT_AMBULATORY_CARE_PROVIDER_SITE_OTHER): Payer: Medicare Other | Admitting: Family Medicine

## 2020-12-25 ENCOUNTER — Encounter: Payer: Self-pay | Admitting: Family Medicine

## 2020-12-25 VITALS — BP 148/84 | Ht 61.0 in

## 2020-12-25 DIAGNOSIS — I10 Essential (primary) hypertension: Secondary | ICD-10-CM | POA: Diagnosis not present

## 2020-12-25 DIAGNOSIS — R519 Headache, unspecified: Secondary | ICD-10-CM | POA: Diagnosis not present

## 2020-12-25 MED ORDER — LOSARTAN POTASSIUM 25 MG PO TABS: 25.0000 mg | ORAL_TABLET | Freq: Every day | ORAL | 1 refills | Status: DC

## 2020-12-25 NOTE — Progress Notes (Signed)
Virtual Visit via Video Note I connected with Julie Schmidt on 12/25/20 by a video enabled telemedicine application and verified that I am speaking with the correct person using two identifiers.  Location patient: home Location provider:work office Persons participating in the virtual visit: patient, daughter,provider  I discussed the limitations of evaluation and management by telemedicine and the availability of in person appointments. The patient expressed understanding and agreed to proceed.  Chief Complaint  Patient presents with   Hypertension   HPI: Julie Schmidt is a 80 yo female with Hx of DM II,HTN,GERD,and peripheral neuropathy concerned about elevated BP. She recently followed with neurosurgeon on 12/18/20 and BP was elevated at 178/79. HTN, she has been on non pharmacologic treatment. She is checking BP at home: 124/71,141/64,122/59,155/63,130/84,163/81.  Negative for new medications. She has not had new stressors. Following low salt diet.  Negative for visual changes, chest pain, dyspnea, palpitation,or edema.  Lab Results  Component Value Date   CREATININE 0.79 09/13/2020   BUN 15 09/13/2020   NA 141 09/13/2020   K 4.2 09/13/2020   CL 104 09/13/2020   CO2 30 09/13/2020   Parietal headache, intermittent. This has been going on for a while. She takes Advil 200 mg daily prn.  No associated visual changes, photophobia, nausea, vomiting, or focal neurologic deficit. Negative for fever,chills,sore throat, or changes in daily activities.  ROS: See pertinent positives and negatives per HPI.  Past Medical History:  Diagnosis Date   Arthritis    knees, back   Diabetes mellitus    Headache    Hypertension     Past Surgical History:  Procedure Laterality Date   ABDOMINAL HYSTERECTOMY     APPENDECTOMY     BRAIN SURGERY  05/19/2014   Craniotomy for meningioma   CARDIAC CATHETERIZATION N/A 11/25/2014   Procedure: Left Heart Cath and Coronary Angiography;  Surgeon:  Jerline Pain, MD;  Location: [facility];  Service: Cardiovascular;  Laterality: N/A;   CESAREAN SECTION     CRANIOTOMY Left 05/19/2014   Procedure: CRANIOTOMY TUMOR EXCISION with Curve;  Surgeon: Erline Levine, MD;  Location: [facility];  Service: Neurosurgery;  Laterality: Left;  Left frontal craniotomy for meningioma with brain lab    Family History  Problem Relation Age of Onset   Cancer Father     Social History   Socioeconomic History   Marital status: Widowed    Spouse name: Not on file   Number of children: Not on file   Years of education: Not on file   Highest education level: Not on file  Occupational History   Not on file  Tobacco Use   Smoking status: Never   Smokeless tobacco: Never  Substance and Sexual Activity   Alcohol use: No    Alcohol/week: 0.0 standard drinks   Drug use: No   Sexual activity: Not on file  Other Topics Concern   Not on file  Social History Narrative   Not on file   Social Determinants of Health   Financial Resource Strain: Not on file  Food Insecurity: Not on file  Transportation Needs: Not on file  Physical Activity: Not on file  Stress: Not on file  Social Connections: Not on file  Intimate Partner Violence: Not on file   Current Outpatient Medications:    Accu-Chek Softclix Lancets lancets, Use to check blood sugar daily. Dx:e11.49, Disp: 100 each, Rfl: 12   aspirin 81 MG tablet, Take 81 mg by mouth daily., Disp: , Rfl:  Blood Glucose Monitoring Suppl (ACCU-CHEK AVIVA CONNECT) w/Device KIT, 1 Device by Does not apply route daily., Disp: 1 kit, Rfl: 0   Blood Glucose Monitoring Suppl (ACCU-CHEK GUIDE ME) w/Device KIT, Use to check blood sugars daily., Disp: 1 kit, Rfl: 0   Cholecalciferol (VITAMIN D3) 2000 units TABS, Take by mouth daily., Disp: , Rfl:    Continuous Blood Gluc Receiver ([place]) DEVI, Use to check blood sugars up to 3 times daily., Disp: 1 each, Rfl: 1   dorzolamide-timolol (COSOPT)  22.3-6.8 MG/ML ophthalmic solution, Place 1 drop into both eyes 2 (two) times daily., Disp: , Rfl:    DULoxetine (CYMBALTA) 30 MG capsule, Take 1 capsule (30 mg total) by mouth daily., Disp: 30 capsule, Rfl: 2   glucose blood (ACCU-CHEK GUIDE) test strip, Use to check blood sugars daily. Dx:e11.49, Disp: 100 each, Rfl: 12   ibuprofen (ADVIL,MOTRIN) 200 MG tablet, Take 200 mg by mouth every 6 (six) hours as needed., Disp: , Rfl:    Lancets Misc. (ACCU-CHEK SOFTCLIX LANCET DEV) KIT, 1 Device by Does not apply route daily., Disp: 100 kit, Rfl: 3   pantoprazole (PROTONIX) 40 MG tablet, Take 1 tablet (40 mg total) by mouth daily., Disp: 90 tablet, Rfl: 2   polyethylene glycol powder (GLYCOLAX/MIRALAX) powder, MIX 255 GRAM PO ONCE FOR 1 DOSE, Disp: , Rfl: 0   pravastatin (PRAVACHOL) 20 MG tablet, Take 1 tablet (20 mg total) by mouth daily., Disp: 90 tablet, Rfl: 2   latanoprost (XALATAN) 0.005 % ophthalmic solution, 1 drop at bedtime. (Patient not taking: No sig reported), Disp: , Rfl:   EXAM:  VITALS per patient if applicable:BP (!) 378/58   Ht _0  (1.549 m)   BMI 25.89 kg/m   GENERAL: alert, oriented, appears well and in no acute distress  HEENT: atraumatic, conjunctiva clear, no obvious abnormalities on inspection.  NECK: normal movements of the head and neck  LUNGS: on inspection no signs of respiratory distress, breathing rate appears normal, no obvious gross SOB, gasping or wheezing  CV: no obvious cyanosis  MS: moves all visible extremities without noticeable abnormality  PSYCH/NEURO: pleasant and cooperative, no obvious depression or anxiety, speech and thought processing grossly intact  ASSESSMENT AND PLAN:  Discussed the following assessment and plan:  Primary hypertension Most BP's mildly elevated. We discussed a few pharmacologic options. She did not tolerate Amlodipine well in the past. She agrees with starting Losartan 25 mg daily. BMP next visit, 10-14  days. Continue monitoring BP regularly. Low salt diet. Instructed about warning signs.  Headache, unspecified headache type Chronic. ? Tension headache. Following with neurosurgeon, Dr Vertell Limber.   We discussed possible serious and likely etiologies, options for evaluation and workup, limitations of telemedicine visit vs in person visit, treatment, treatment risks and precautions.  I discussed the assessment and treatment plan with the patient. Julie Schmidt was provided an opportunity to ask questions and all were answered. She agreed with the plan and demonstrated an understanding of the instructions.  Return in about 2 weeks (around 01/08/2021) for HTN.   Charnita Trudel [name], MD

## 2020-12-28 DIAGNOSIS — D329 Benign neoplasm of meninges, unspecified: Secondary | ICD-10-CM | POA: Diagnosis not present

## 2021-01-02 ENCOUNTER — Telehealth: Payer: Self-pay | Admitting: Pharmacist

## 2021-01-02 NOTE — Chronic Care Management (AMB) (Signed)
Chronic Care Management Pharmacy Assistant   Name: Julie Schmidt  MRN: [id number] DOB: [birth year].  Reason for Encounter: Medication Review-Medication Coordination Call   Recent office visits:  07.18.2022 [name], [name], MD (video visit) follow-up for hypertension. Patient started Losartan 25 mg daily  Recent consult visits:  07.11.2022 Julie Limber MD, Julie Schmidt patient seen for follow-up on chronic pain  Hospital visits:  None in previous 6 months  Medications: Outpatient Encounter Medications as of 01/02/2021  Medication Sig   Accu-Chek Softclix Lancets lancets Use to check blood sugar daily. Dx:e11.49   aspirin 81 MG tablet Take 81 mg by mouth daily.   Blood Glucose Monitoring Suppl (ACCU-CHEK AVIVA CONNECT) w/Device KIT 1 Device by Does not apply route daily.   Blood Glucose Monitoring Suppl (ACCU-CHEK GUIDE ME) w/Device KIT Use to check blood sugars daily.   Cholecalciferol (VITAMIN D3) 2000 units TABS Take by mouth daily.   Continuous Blood Gluc Receiver (DEXCOM G6 RECEIVER) DEVI Use to check blood sugars up to 3 times daily.   dorzolamide-timolol (COSOPT) 22.3-6.8 MG/ML ophthalmic solution Place 1 drop into both eyes 2 (two) times daily.   DULoxetine (CYMBALTA) 30 MG capsule Take 1 capsule (30 mg total) by mouth daily.   glucose blood (ACCU-CHEK GUIDE) test strip Use to check blood sugars daily. Dx:e11.49   ibuprofen (ADVIL,MOTRIN) 200 MG tablet Take 200 mg by mouth every 6 (six) hours as needed.   Lancets Misc. (ACCU-CHEK SOFTCLIX LANCET DEV) KIT 1 Device by Does not apply route daily.   latanoprost (XALATAN) 0.005 % ophthalmic solution 1 drop at bedtime. (Patient not taking: No sig reported)   losartan (COZAAR) 25 MG tablet Take 1 tablet (25 mg total) by mouth daily.   pantoprazole (PROTONIX) 40 MG tablet Take 1 tablet (40 mg total) by mouth daily.   polyethylene glycol powder (GLYCOLAX/MIRALAX) powder MIX 255 GRAM PO ONCE FOR 1 DOSE    pravastatin (PRAVACHOL) 20 MG tablet Take 1 tablet (20 mg total) by mouth daily.   No facility-administered encounter medications on file as of 01/02/2021.  Reviewed chart for medication changes ahead of medication coordination call.  BP Readings from Last 3 Encounters:  12/25/20 (!) 148/84  09/13/20 128/70  03/08/20 124/70    Lab Results  Component Value Date   HGBA1C 6.3 09/13/2020    Patient obtains medications through Adherence Packaging  30 Days  Last adherence delivery included: (medication name and frequency) Pravastatin (PRAVACHOL) 20 MG tablet Pantoprazole (PROTONIX) 40 MG tablet Losartan (COZAAR) 25 MG tablet Dorzolamide-timolol (COSOPT) 22.3-6.8 MG/ML ophthalmic solution Duloxetine 30 mg: one capsule daily  Patient is due for next adherence delivery on: 07.29.202. Called patient and reviewed medications and coordinated delivery. This delivery to include: adherence delivery included: (medication name and frequency) Duloxetine 30 mg: one capsule daily  Patient declined the following medications  due to the following reason Pravastatin (PRAVACHOL) 20 MG tablet last filled 06.08.22 for 90 DS Pantoprazole (PROTONIX) 40 MG tablet last filled 06.08.22 for 90 DS Losartan (COZAAR) 25 MG tablet last filled 07.20.22 for 30 DS Dorzolamide-timolol (COSOPT) 22.3-6.8 MG/ML ophthalmic solution last filled 06.22.22 for 50 days  She currently does not need refills Confirmed delivery date of 07.29.2022, advised patient that pharmacy will contact them the morning of delivery.   Care Gaps: Zoster Vaccines Dexa Scan Eye exam Covid-19 4th Booster  Star Rating Drugs: Medication Dispensed Quantity Pharmacy  Pravastatin 20 mg 06.03.2022 90 [facility]    Julie Schmidt, CMA  Clinical Pharmacist Assistant [phone]

## 2021-01-05 ENCOUNTER — Encounter (HOSPITAL_COMMUNITY): Payer: Self-pay

## 2021-01-05 ENCOUNTER — Emergency Department (HOSPITAL_COMMUNITY): Payer: Medicare Other

## 2021-01-05 ENCOUNTER — Other Ambulatory Visit: Payer: Self-pay

## 2021-01-05 ENCOUNTER — Emergency Department (HOSPITAL_COMMUNITY)
Admission: EM | Admit: 2021-01-05 | Discharge: 2021-01-05 | Disposition: A | Discharge status: Home / Self Care | Payer: Medicare Other | Attending: Emergency Medicine | Admitting: Emergency Medicine

## 2021-01-05 DIAGNOSIS — R059 Cough, unspecified: Secondary | ICD-10-CM | POA: Diagnosis present

## 2021-01-05 DIAGNOSIS — I10 Essential (primary) hypertension: Secondary | ICD-10-CM | POA: Insufficient documentation

## 2021-01-05 DIAGNOSIS — R0602 Shortness of breath: Secondary | ICD-10-CM | POA: Diagnosis not present

## 2021-01-05 DIAGNOSIS — E119 Type 2 diabetes mellitus without complications: Secondary | ICD-10-CM | POA: Insufficient documentation

## 2021-01-05 DIAGNOSIS — Z7982 Long term (current) use of aspirin: Secondary | ICD-10-CM | POA: Insufficient documentation

## 2021-01-05 DIAGNOSIS — U071 COVID-19: Secondary | ICD-10-CM | POA: Insufficient documentation

## 2021-01-05 DIAGNOSIS — I517 Cardiomegaly: Secondary | ICD-10-CM | POA: Diagnosis not present

## 2021-01-05 LAB — RESP PANEL BY RT-PCR (FLU A&B, COVID) ARPGX2
Influenza A by PCR: NEGATIVE
Influenza B by PCR: NEGATIVE
SARS Coronavirus 2 by RT PCR: POSITIVE — AB

## 2021-01-05 LAB — CBC WITH DIFFERENTIAL/PLATELET
Abs Immature Granulocytes: 0 10*3/uL (ref 0.00–0.07)
Basophils Absolute: 0 10*3/uL (ref 0.0–0.1)
Basophils Relative: 1 %
Eosinophils Absolute: 0.3 10*3/uL (ref 0.0–0.5)
Eosinophils Relative: 7 %
HCT: 35.5 % — ABNORMAL LOW (ref 36.0–46.0)
Hemoglobin: 11.3 g/dL — ABNORMAL LOW (ref 12.0–15.0)
Immature Granulocytes: 0 %
Lymphocytes Relative: 33 %
Lymphs Abs: 1.4 10*3/uL (ref 0.7–4.0)
MCH: 30 pg (ref 26.0–34.0)
MCHC: 31.8 g/dL (ref 30.0–36.0)
MCV: 94.2 fL (ref 80.0–100.0)
Monocytes Absolute: 0.3 10*3/uL (ref 0.1–1.0)
Monocytes Relative: 8 %
Neutro Abs: 2.2 10*3/uL (ref 1.7–7.7)
Neutrophils Relative %: 51 %
Platelets: 275 10*3/uL (ref 150–400)
RBC: 3.77 MIL/uL — ABNORMAL LOW (ref 3.87–5.11)
RDW: 12.2 % (ref 11.5–15.5)
WBC: 4.3 10*3/uL (ref 4.0–10.5)
nRBC: 0 % (ref 0.0–0.2)

## 2021-01-05 LAB — COMPREHENSIVE METABOLIC PANEL
ALT: 24 U/L (ref 0–44)
AST: 29 U/L (ref 15–41)
Albumin: 3.8 g/dL (ref 3.5–5.0)
Alkaline Phosphatase: 92 U/L (ref 38–126)
Anion gap: 9 (ref 5–15)
BUN: 13 mg/dL (ref 8–23)
CO2: 28 mmol/L (ref 22–32)
Calcium: 9.2 mg/dL (ref 8.9–10.3)
Chloride: 103 mmol/L (ref 98–111)
Creatinine, Ser: 0.79 mg/dL (ref 0.44–1.00)
GFR, Estimated: >60 mL/min (ref >60)
Glucose, Bld: 112 mg/dL — ABNORMAL HIGH (ref 70–99)
Potassium: 3.9 mmol/L (ref 3.5–5.1)
Sodium: 140 mmol/L (ref 135–145)
Total Bilirubin: 0.7 mg/dL (ref 0.3–1.2)
Total Protein: 7.3 g/dL (ref 6.5–8.1)

## 2021-01-05 MED ORDER — NIRMATRELVIR/RITONAVIR (PAXLOVID)TABLET: 3.0000 | ORAL_TABLET | Freq: Two times a day (BID) | ORAL | 0 refills | Status: AC

## 2021-01-05 NOTE — ED Notes (Signed)
Received verbal report from Paden B RN at this time 

## 2021-01-05 NOTE — ED Triage Notes (Signed)
Pt reports cough for 4 days and emesis that started yesterday. Pt denies abd pain or fever. Took a covid test at home and it was positive.

## 2021-01-05 NOTE — Discharge Instructions (Addendum)
Stop taking your Pravachol while you are taking the Paxlovid.  Return to emergency room if you have any worsening symptoms including shortness of breath, ongoing vomiting or other worsening symptoms.

## 2021-01-05 NOTE — ED Provider Notes (Signed)
Emergency Medicine Provider Triage Evaluation Note  CHANETTE KILL , a 80 y.o. female  was evaluated in triage.  Pt complains of cough starting 4-5 days ago. Has also had nv. Tested positive for covid but is requesting repeat testing for confirmation.  Review of Systems  Positive: Nv, cough Negative: Fever, chest pain, sob  Physical Exam  BP (!) 173/78 (BP Location: Left Arm)   Pulse 66   Temp 98.3 F (36.8 C) (Oral)   Resp 16   SpO2 97%  Gen:   Awake, no distress   Resp:  Normal effort  MSK:   Moves extremities without difficulty  Other:    Medical Decision Making  Medically screening exam initiated at 1:30 PM.  Appropriate orders placed.  JAIRE BECHERER was informed that the remainder of the evaluation will be completed by another provider, this initial triage assessment does not replace that evaluation, and the importance of remaining in the ED until their evaluation is complete.     Bishop Dublin 01/05/21 1331    Lajean Saver, MD 01/06/21 1258

## 2021-01-05 NOTE — ED Provider Notes (Signed)
[facility] EMERGENCY DEPARTMENT Provider Note   CSN: [id number] Arrival date & time: 01/05/21  1137     History Chief Complaint  Patient presents with   Cough   Emesis    Julie Schmidt is a 80 y.o. female.  Patient is a 80 year old female with a history of diabetes and hypertension who presents with cough and cold symptoms.  She says this started about 4 to 5 days ago.  She has had some nasal congestion, coughing and fatigue.  No shortness of breath.  She has had 2 episodes of vomiting over the last couple days but no ongoing vomiting.  No known fevers.  No shortness of breath.  No chest pain.  She has another family member that she lives with here with similar symptoms.  She says she took a COVID test at home that was positive.  She is not taking any medications for this.      Past Medical History:  Diagnosis Date   Arthritis    knees, back   Diabetes mellitus    Headache    Hypertension     Patient Active Problem List   Diagnosis Date Noted   GERD (gastroesophageal reflux disease) 12/07/2019   Generalized osteoarthritis of multiple sites 04/03/2018   Hyperlipidemia associated with type 2 diabetes mellitus ([place]) 01/14/2018   Primary hypertension 05/07/2016   Abnormal nuclear stress test 11/25/2014   Abnormal EKG 11/02/2014   Prolonged Q-T interval on ECG 11/02/2014   Diet-controlled diabetes mellitus ([place]) 41/32/4401   Helicobacter pylori gastritis 09/18/2014   Type 2 diabetes mellitus with neurological complications ([place]) 02/72/5366   Meningioma ([place]) 05/19/2014   Back pain, chronic 10/25/2011    Past Surgical History:  Procedure Laterality Date   ABDOMINAL HYSTERECTOMY     APPENDECTOMY     BRAIN SURGERY  05/19/2014   Craniotomy for meningioma   CARDIAC CATHETERIZATION N/A 11/25/2014   Procedure: Left Heart Cath and Coronary Angiography;  Surgeon: Jerline Pain, MD;  Location: [facility];  Service: Cardiovascular;  Laterality: N/A;    CESAREAN SECTION     CRANIOTOMY Left 05/19/2014   Procedure: CRANIOTOMY TUMOR EXCISION with Curve;  Surgeon: Erline Levine, MD;  Location: [facility];  Service: Neurosurgery;  Laterality: Left;  Left frontal craniotomy for meningioma with brain lab     OB History   No obstetric history on file.     Family History  Problem Relation Age of Onset   Cancer Father     Social History   Tobacco Use   Smoking status: Never   Smokeless tobacco: Never  Substance Use Topics   Alcohol use: No    Alcohol/week: 0.0 standard drinks   Drug use: No    Home Medications Prior to Admission medications   Medication Sig Start Date End Date Taking? Authorizing Provider  nirmatrelvir/ritonavir EUA (PAXLOVID) TABS Take 3 tablets by mouth 2 (two) times daily for 5 days. Patient GFR is 60. Take nirmatrelvir (150 mg) two tablets twice daily for 5 days and ritonavir (100 mg) one tablet twice daily for 5 days. 01/05/21 01/10/21 Yes Malvin Johns, MD  Accu-Chek Softclix Lancets lancets Use to check blood sugar daily. Dx:e11.49 09/13/20   [name], [name], MD  aspirin 81 MG tablet Take 81 mg by mouth daily.    [provider]  Blood Glucose Monitoring Suppl (ACCU-CHEK AVIVA CONNECT) w/Device KIT 1 Device by Does not apply route daily. 03/08/20   [name], [name], MD  Blood Glucose Monitoring Suppl (ACCU-CHEK GUIDE ME) w/Device KIT Use to check blood sugars daily. 09/13/20   [name], [name], MD  Cholecalciferol (VITAMIN D3) 2000 units TABS Take by mouth daily.    [provider]  Continuous Blood Gluc Receiver (DEXCOM G6 RECEIVER) DEVI Use to check blood sugars up to 3 times daily. 12/10/19   [name], [name], MD  dorzolamide-timolol (COSOPT) 22.3-6.8 MG/ML ophthalmic solution Place 1 drop into both eyes 2 (two) times daily. 11/29/20   [provider]  DULoxetine (CYMBALTA) 30 MG capsule Take 1 capsule (30 mg total) by mouth daily. 09/13/20   [name], [name], MD  glucose blood (ACCU-CHEK GUIDE)  test strip Use to check blood sugars daily. Dx:e11.49 09/13/20   [name], [name], MD  ibuprofen (ADVIL,MOTRIN) 200 MG tablet Take 200 mg by mouth every 6 (six) hours as needed.    [provider]  Lancets Misc. (ACCU-CHEK SOFTCLIX LANCET DEV) KIT 1 Device by Does not apply route daily. 02/16/20   [name], [name], MD  latanoprost (XALATAN) 0.005 % ophthalmic solution 1 drop at bedtime. Patient not taking: No sig reported 02/18/20   [provider]  losartan (COZAAR) 25 MG tablet Take 1 tablet (25 mg total) by mouth daily. 12/25/20   [name], [name], MD  pantoprazole (PROTONIX) 40 MG tablet Take 1 tablet (40 mg total) by mouth daily. 09/13/20   [name], [name], MD  polyethylene glycol powder (GLYCOLAX/MIRALAX) powder MIX 255 GRAM PO ONCE FOR 1 DOSE 12/18/17   [provider]  pravastatin (PRAVACHOL) 20 MG tablet Take 1 tablet (20 mg total) by mouth daily. 09/13/20   [name], [name], MD    Allergies    Norvasc [amlodipine] and Oxycodone-acetaminophen  Review of Systems   Review of Systems  Constitutional:  Positive for fatigue. Negative for chills, diaphoresis and fever.  HENT:  Positive for congestion and rhinorrhea. Negative for sneezing.   Eyes: Negative.   Respiratory:  Positive for cough. Negative for chest tightness and shortness of breath.   Cardiovascular:  Negative for chest pain and leg swelling.  Gastrointestinal:  Positive for nausea and vomiting. Negative for abdominal pain, blood in stool and diarrhea.  Genitourinary:  Negative for difficulty urinating, flank pain, frequency and hematuria.  Musculoskeletal:  Negative for arthralgias and back pain.  Skin:  Negative for rash.  Neurological:  Positive for headaches. Negative for dizziness, speech difficulty, weakness and numbness.   Physical Exam Updated Vital Signs BP (!) 179/102 (BP Location: Right Arm)   Pulse 69   Temp 98.3 F (36.8 C) (Oral)   Resp 12   SpO2 98%   Physical Exam Constitutional:       Appearance: She is well-developed.  HENT:     Head: Normocephalic and atraumatic.  Eyes:     Pupils: Pupils are equal, round, and reactive to light.  Cardiovascular:     Rate and Rhythm: Normal rate and regular rhythm.     Heart sounds: Normal heart sounds.  Pulmonary:     Effort: Pulmonary effort is normal. No respiratory distress.     Breath sounds: Normal breath sounds. No wheezing or rales.  Chest:     Chest wall: No tenderness.  Abdominal:     General: Bowel sounds are normal.     Palpations: Abdomen is soft.     Tenderness: There is no abdominal tenderness. There is no guarding or rebound.  Musculoskeletal:        General: Normal range of  motion.     Cervical back: Normal range of motion and neck supple.  Lymphadenopathy:     Cervical: No cervical adenopathy.  Skin:    General: Skin is warm and dry.     Findings: No rash.  Neurological:     Mental Status: She is alert and oriented to person, place, and time.    ED Results / Procedures / Treatments   Labs (all labs ordered are listed, but only abnormal results are displayed) Labs Reviewed  RESP PANEL BY RT-PCR (FLU A&B, COVID) ARPGX2 - Abnormal; Notable for the following components:      Result Value   SARS Coronavirus 2 by RT PCR POSITIVE (*)    All other components within normal limits  CBC WITH DIFFERENTIAL/PLATELET - Abnormal; Notable for the following components:   RBC 3.77 (*)    Hemoglobin 11.3 (*)    HCT 35.5 (*)    All other components within normal limits  COMPREHENSIVE METABOLIC PANEL - Abnormal; Notable for the following components:   Glucose, Bld 112 (*)    All other components within normal limits    EKG None  Radiology DG Chest Port 1 View  Result Date: 01/05/2021 CLINICAL DATA:  COVID positive, shortness of breath EXAM: PORTABLE CHEST 1 VIEW COMPARISON:  06/21/2016 FINDINGS: Cardiomegaly. Both lungs are clear. The visualized skeletal structures are unremarkable. IMPRESSION: Cardiomegaly  without acute abnormality of the lungs in AP portable projection. Electronically Signed   By: Eddie Candle M.D.   On: 01/05/2021 16:28    Procedures Procedures   Medications Ordered in ED Medications - No data to display  ED Course  I have reviewed the triage vital signs and the nursing notes.  Pertinent labs & imaging results that were available during my care of the patient were reviewed by me and considered in my medical decision making (see chart for details).    MDM Rules/Calculators/A&P                           Patient is a 80 year old female who presents with flulike symptoms.  She is COVID-positive.  Her vital signs are stable.  She has no shortness of breath.  Her chest x-ray is clear without evidence of pneumonia.  Her labs are nonconcerning.  Her renal function is normal.  She was started on Paxlovid after discussion.  She was given symptomatic care instructions and return precautions.  Julie Schmidt was evaluated in Emergency Department on 01/05/2021 for the symptoms described in the history of present illness. She was evaluated in the context of the global COVID-19 pandemic, which necessitated consideration that the patient might be at risk for infection with the SARS-CoV-2 virus that causes COVID-19. Institutional protocols and algorithms that pertain to the evaluation of patients at risk for COVID-19 are in a state of rapid change based on information released by regulatory bodies including the CDC and federal and state organizations. These policies and algorithms were followed during the patient's care in the ED.  Final Clinical Impression(s) / ED Diagnoses Final diagnoses:  [id number] virus infection    Rx / DC Orders ED Discharge Orders          Ordered    nirmatrelvir/ritonavir EUA (PAXLOVID) TABS  2 times daily        01/05/21 2009             Malvin Johns, MD 01/05/21 2013

## 2021-01-08 ENCOUNTER — Ambulatory Visit (INDEPENDENT_AMBULATORY_CARE_PROVIDER_SITE_OTHER): Payer: Medicare Other

## 2021-01-08 DIAGNOSIS — Z0000 Encounter for general adult medical examination without abnormal findings: Secondary | ICD-10-CM

## 2021-01-08 DIAGNOSIS — Z780 Asymptomatic menopausal state: Secondary | ICD-10-CM | POA: Diagnosis not present

## 2021-01-08 NOTE — Patient Instructions (Signed)
Julie Schmidt , Thank you for taking time to come for your Medicare Wellness Visit. I appreciate your ongoing commitment to your health goals. Please review the following plan we discussed and let me know if I can assist you in the future.   Screening recommendations/referrals: Colonoscopy: no longer required  Mammogram: no longer required  Bone Density: referral completed 01/08/2021 Recommended yearly ophthalmology/optometry visit for glaucoma screening and checkup Recommended yearly dental visit for hygiene and checkup  Vaccinations: Influenza vaccine: due in fall 2022  Pneumococcal vaccine: completed series  Tdap vaccine: currently due with injury  Shingles vaccine: will consider     Advanced directives: none   Conditions/risks identified: none   Next appointment: 01/15/2021  200pm with Dr.Jordan     Preventive Care 45 Years and Older, Female Preventive care refers to lifestyle choices and visits with your health care provider that can promote health and wellness. What does preventive care include? A yearly physical exam. This is also called an annual well check. Dental exams once or twice a year. Routine eye exams. Ask your health care provider how often you should have your eyes checked. Personal lifestyle choices, including: Daily care of your teeth and gums. Regular physical activity. Eating a healthy diet. Avoiding tobacco and drug use. Limiting alcohol use. Practicing safe sex. Taking low-dose aspirin every day. Taking vitamin and mineral supplements as recommended by your health care provider. What happens during an annual well check? The services and screenings done by your health care provider during your annual well check will depend on your age, overall health, lifestyle risk factors, and family history of disease. Counseling  Your health care provider may ask you questions about your: Alcohol use. Tobacco use. Drug use. Emotional well-being. Home and  relationship well-being. Sexual activity. Eating habits. History of falls. Memory and ability to understand (cognition). Work and work Statistician. Reproductive health. Screening  You may have the following tests or measurements: Height, weight, and BMI. Blood pressure. Lipid and cholesterol levels. These may be checked every 5 years, or more frequently if you are over 96 years old. Skin check. Lung cancer screening. You may have this screening every year starting at age 67 if you have a 30-pack-year history of smoking and currently smoke or have quit within the past 15 years. Fecal occult blood test (FOBT) of the stool. You may have this test every year starting at age 54. Flexible sigmoidoscopy or colonoscopy. You may have a sigmoidoscopy every 5 years or a colonoscopy every 10 years starting at age 45. Hepatitis C blood test. Hepatitis B blood test. Sexually transmitted disease (STD) testing. Diabetes screening. This is done by checking your blood sugar (glucose) after you have not eaten for a while (fasting). You may have this done every 1-3 years. Bone density scan. This is done to screen for osteoporosis. You may have this done starting at age 69. Mammogram. This may be done every 1-2 years. Talk to your health care provider about how often you should have regular mammograms. Talk with your health care provider about your test results, treatment options, and if necessary, the need for more tests. Vaccines  Your health care provider may recommend certain vaccines, such as: Influenza vaccine. This is recommended every year. Tetanus, diphtheria, and acellular pertussis (Tdap, Td) vaccine. You may need a Td booster every 10 years. Zoster vaccine. You may need this after age 75. Pneumococcal 13-valent conjugate (PCV13) vaccine. One dose is recommended after age 39. Pneumococcal polysaccharide (PPSV23) vaccine. One dose  is recommended after age 27. Talk to your health care provider  about which screenings and vaccines you need and how often you need them. This information is not intended to replace advice given to you by your health care provider. Make sure you discuss any questions you have with your health care provider. Document Released: 06/23/2015 Document Revised: 02/14/2016 Document Reviewed: 03/28/2015 Elsevier Interactive Patient Education  2017 [place] Prevention in the Home Falls can cause injuries. They can happen to people of all ages. There are many things you can do to make your home safe and to help prevent falls. What can I do on the outside of my home? Regularly fix the edges of walkways and driveways and fix any cracks. Remove anything that might make you trip as you walk through a door, such as a raised step or threshold. Trim any bushes or trees on the path to your home. Use bright outdoor lighting. Clear any walking paths of anything that might make someone trip, such as rocks or tools. Regularly check to see if handrails are loose or broken. Make sure that both sides of any steps have handrails. Any raised decks and porches should have guardrails on the edges. Have any leaves, snow, or ice cleared regularly. Use sand or salt on walking paths during winter. Clean up any spills in your garage right away. This includes oil or grease spills. What can I do in the bathroom? Use night lights. Install grab bars by the toilet and in the tub and shower. Do not use towel bars as grab bars. Use non-skid mats or decals in the tub or shower. If you need to sit down in the shower, use a plastic, non-slip stool. Keep the floor dry. Clean up any water that spills on the floor as soon as it happens. Remove soap buildup in the tub or shower regularly. Attach bath mats securely with double-sided non-slip rug tape. Do not have throw rugs and other things on the floor that can make you trip. What can I do in the bedroom? Use night lights. Make sure  that you have a light by your bed that is easy to reach. Do not use any sheets or blankets that are too big for your bed. They should not hang down onto the floor. Have a firm chair that has side arms. You can use this for support while you get dressed. Do not have throw rugs and other things on the floor that can make you trip. What can I do in the kitchen? Clean up any spills right away. Avoid walking on wet floors. Keep items that you use a lot in easy-to-reach places. If you need to reach something above you, use a strong step stool that has a grab bar. Keep electrical cords out of the way. Do not use floor polish or wax that makes floors slippery. If you must use wax, use non-skid floor wax. Do not have throw rugs and other things on the floor that can make you trip. What can I do with my stairs? Do not leave any items on the stairs. Make sure that there are handrails on both sides of the stairs and use them. Fix handrails that are broken or loose. Make sure that handrails are as long as the stairways. Check any carpeting to make sure that it is firmly attached to the stairs. Fix any carpet that is loose or worn. Avoid having throw rugs at the top or bottom of the stairs.  If you do have throw rugs, attach them to the floor with carpet tape. Make sure that you have a light switch at the top of the stairs and the bottom of the stairs. If you do not have them, ask someone to add them for you. What else can I do to help prevent falls? Wear shoes that: Do not have high heels. Have rubber bottoms. Are comfortable and fit you well. Are closed at the toe. Do not wear sandals. If you use a stepladder: Make sure that it is fully opened. Do not climb a closed stepladder. Make sure that both sides of the stepladder are locked into place. Ask someone to hold it for you, if possible. Clearly mark and make sure that you can see: Any grab bars or handrails. First and last steps. Where the edge of  each step is. Use tools that help you move around (mobility aids) if they are needed. These include: Canes. Walkers. Scooters. Crutches. Turn on the lights when you go into a dark area. Replace any light bulbs as soon as they burn out. Set up your furniture so you have a clear path. Avoid moving your furniture around. If any of your floors are uneven, fix them. If there are any pets around you, be aware of where they are. Review your medicines with your doctor. Some medicines can make you feel dizzy. This can increase your chance of falling. Ask your doctor what other things that you can do to help prevent falls. This information is not intended to replace advice given to you by your health care provider. Make sure you discuss any questions you have with your health care provider. Document Released: 03/23/2009 Document Revised: 11/02/2015 Document Reviewed: 07/01/2014 Elsevier Interactive Patient Education  2017 [employer].

## 2021-01-08 NOTE — Progress Notes (Signed)
Subjective:   Julie Schmidt is a 80 y.o. female who presents for Medicare Annual (Subsequent) preventive examination.  I connected with Evonnie Pat  today by telephone and verified that I am speaking with the correct person using two identifiers. Location patient: home Location provider: work Persons participating in the virtual visit: patient, provider.   I discussed the limitations, risks, security and privacy concerns of performing an evaluation and management service by telephone and the availability of in person appointments. I also discussed with the patient that there may be a patient responsible charge related to this service. The patient expressed understanding and verbally consented to this telephonic visit.    Interactive audio and video telecommunications were attempted between this provider and patient, however failed, due to patient having technical difficulties OR patient did not have access to video capability.  We continued and completed visit with audio only.     Review of Systems    N/a       Objective:    There were no vitals filed for this visit. There is no height or weight on file to calculate BMI.  Advanced Directives 01/05/2021 07/04/2016 06/21/2016 06/21/2016 11/25/2014 09/17/2014 03/24/2014  Does Patient Have a Medical Advance Directive? No No No No No No No  Would patient like information on creating a medical advance directive? Yes (ED - Information included in AVS) No - Patient declined - No - Patient declined No - patient declined information - -    Current Medications (verified) Outpatient Encounter Medications as of 01/08/2021  Medication Sig   Accu-Chek Softclix Lancets lancets Use to check blood sugar daily. Dx:e11.49   aspirin 81 MG tablet Take 81 mg by mouth daily.   Blood Glucose Monitoring Suppl (ACCU-CHEK AVIVA CONNECT) w/Device KIT 1 Device by Does not apply route daily.   Blood Glucose Monitoring Suppl (ACCU-CHEK GUIDE ME) w/Device KIT Use to  check blood sugars daily.   Cholecalciferol (VITAMIN D3) 2000 units TABS Take by mouth daily.   Continuous Blood Gluc Receiver (DEXCOM G6 RECEIVER) DEVI Use to check blood sugars up to 3 times daily.   dorzolamide-timolol (COSOPT) 22.3-6.8 MG/ML ophthalmic solution Place 1 drop into both eyes 2 (two) times daily.   DULoxetine (CYMBALTA) 30 MG capsule Take 1 capsule (30 mg total) by mouth daily.   glucose blood (ACCU-CHEK GUIDE) test strip Use to check blood sugars daily. Dx:e11.49   ibuprofen (ADVIL,MOTRIN) 200 MG tablet Take 200 mg by mouth every 6 (six) hours as needed.   Lancets Misc. (ACCU-CHEK SOFTCLIX LANCET DEV) KIT 1 Device by Does not apply route daily.   latanoprost (XALATAN) 0.005 % ophthalmic solution 1 drop at bedtime. (Patient not taking: No sig reported)   losartan (COZAAR) 25 MG tablet Take 1 tablet (25 mg total) by mouth daily.   nirmatrelvir/ritonavir EUA (PAXLOVID) TABS Take 3 tablets by mouth 2 (two) times daily for 5 days. Patient GFR is 60. Take nirmatrelvir (150 mg) two tablets twice daily for 5 days and ritonavir (100 mg) one tablet twice daily for 5 days.   pantoprazole (PROTONIX) 40 MG tablet Take 1 tablet (40 mg total) by mouth daily.   polyethylene glycol powder (GLYCOLAX/MIRALAX) powder MIX 255 GRAM PO ONCE FOR 1 DOSE   pravastatin (PRAVACHOL) 20 MG tablet Take 1 tablet (20 mg total) by mouth daily.   No facility-administered encounter medications on file as of 01/08/2021.    Allergies (verified) Norvasc [amlodipine] and Oxycodone-acetaminophen   History: Past Medical History:  Diagnosis Date  Arthritis    knees, back   Diabetes mellitus    Headache    Hypertension    Past Surgical History:  Procedure Laterality Date   ABDOMINAL HYSTERECTOMY     APPENDECTOMY     BRAIN SURGERY  05/19/2014   Craniotomy for meningioma   CARDIAC CATHETERIZATION N/A 11/25/2014   Procedure: Left Heart Cath and Coronary Angiography;  Surgeon: Jerline Pain, MD;  Location: [facility];  Service: Cardiovascular;  Laterality: N/A;   CESAREAN SECTION     CRANIOTOMY Left 05/19/2014   Procedure: CRANIOTOMY TUMOR EXCISION with Curve;  Surgeon: Erline Levine, MD;  Location: [facility];  Service: Neurosurgery;  Laterality: Left;  Left frontal craniotomy for meningioma with brain lab   Family History  Problem Relation Age of Onset   Cancer Father    Social History   Socioeconomic History   Marital status: Widowed    Spouse name: Not on file   Number of children: Not on file   Years of education: Not on file   Highest education level: Not on file  Occupational History   Not on file  Tobacco Use   Smoking status: Never   Smokeless tobacco: Never  Substance and Sexual Activity   Alcohol use: No    Alcohol/week: 0.0 standard drinks   Drug use: No   Sexual activity: Not on file  Other Topics Concern   Not on file  Social History Narrative   Not on file   Social Determinants of Health   Financial Resource Strain: Not on file  Food Insecurity: Not on file  Transportation Needs: Not on file  Physical Activity: Not on file  Stress: Not on file  Social Connections: Not on file    Tobacco Counseling Counseling given: Not Answered   Clinical Intake:                 Diabetic?yes  Nutrition Risk Assessment:  Has the patient had any N/V/D within the last 2 months?  No  Does the patient have any non-healing wounds?  No  Has the patient had any unintentional weight loss or weight gain?  No   Diabetes:  Is the patient diabetic?  Yes  If diabetic, was a CBG obtained today?  No  Did the patient bring in their glucometer from home?  No  How often do you monitor your CBG's? Daily .   Financial Strains and Diabetes Management:  Are you having any financial strains with the device, your supplies or your medication? No .  Does the patient want to be seen by Chronic Care Management for management of their diabetes?  No  Would the patient  like to be referred to a Nutritionist or for Diabetic Management?  No   Diabetic Exams:  Diabetic Eye Exam: Completed 11/28/2020 Diabetic Foot Exam: Overdue, Pt has been advised about the importance in completing this exam. Pt is scheduled for diabetic foot exam on next office visit .          Activities of Daily Living No flowsheet data found.  Patient Care Team: [name], [name], MD as PCP - General (Family Medicine) Viona Gilmore, [facility] as Pharmacist (Pharmacist)  Indicate any recent Medical Services you may have received from other than [facility] providers in the past year (date may be approximate).     Assessment:   This is a routine wellness examination for Alejandria.  Hearing/Vision screen No results found.  Dietary issues and exercise activities discussed:  Goals Addressed   None    Depression Screen PHQ 2/9 Scores 03/12/2020 06/11/2019 01/14/2018 09/10/2016 07/04/2016 06/21/2016 06/28/2014  PHQ - 2 Score 0 0 0 0 0 0 0    Fall Risk Fall Risk  03/12/2020 06/11/2019 09/10/2016 07/04/2016 06/28/2014  Falls in the past year? 0 0 No Yes No  Number falls in past yr: 0 0 - 2 or more -  Injury with Fall? 0 0 - Yes -  Risk Factor Category  - - - High Fall Risk -  Risk for fall due to : - Impaired balance/gait;Orthopedic patient - History of fall(s);Impaired balance/gait -  Follow up Education provided Education provided - Education provided;Falls prevention discussed -    FALL RISK PREVENTION PERTAINING TO THE HOME:  Any stairs in or around the home? No  If so, are there any without handrails? Yes  Home free of loose throw rugs in walkways, pet beds, electrical cords, etc? Yes  Adequate lighting in your home to reduce risk of falls? Yes   ASSISTIVE DEVICES UTILIZED TO PREVENT FALLS:  Life alert? No  Use of a cane, walker or w/c? No  Grab bars in the bathroom? Yes  Shower chair or bench in shower? Yes  Elevated toilet seat or a handicapped toilet? Yes    Cognitive Function:   Normal cognitive status assessed by direct observation by this Nurse Health Advisor. No abnormalities found.        Immunizations Immunization History  Administered Date(s) Administered   Fluad Quad(high Dose 65+) 06/08/2019, 02/16/2020   Hepatitis A, Adult 04/03/2018   Influenza, High Dose Seasonal PF 04/23/2016   Influenza-Unspecified 03/29/2014   PFIZER(Purple Top)SARS-COV-2 Vaccination 06/28/2019, 07/19/2019, 03/03/2020   Pneumococcal Conjugate-13 01/30/2015, 03/13/2018   Pneumococcal Polysaccharide-23 06/07/2016    TDAP status: Due, Education has been provided regarding the importance of this vaccine. Advised may receive this vaccine at local pharmacy or Health Dept. Aware to provide a copy of the vaccination record if obtained from local pharmacy or Health Dept. Verbalized acceptance and understanding.  Flu Vaccine status: Up to date  Pneumococcal vaccine status: Up to date  Covid-19 vaccine status: Completed vaccines  Qualifies for Shingles Vaccine? Yes   Zostavax completed No   Shingrix Completed?: No.    Education has been provided regarding the importance of this vaccine. Patient has been advised to call insurance company to determine out of pocket expense if they have not yet received this vaccine. Advised may also receive vaccine at local pharmacy or Health Dept. Verbalized acceptance and understanding.  Screening Tests Health Maintenance  Topic Date Due   Zoster Vaccines- Shingrix (1 of 2) Never done   DEXA SCAN  Never done   OPHTHALMOLOGY EXAM  06/01/2020   COVID-19 Vaccine (4 - Booster for Pfizer series) 07/03/2020   INFLUENZA VACCINE  01/08/2021   HEMOGLOBIN A1C  03/15/2021   FOOT EXAM  09/13/2021   Hepatitis C Screening  Completed   PNA vac Low Risk Adult  Completed   HPV VACCINES  Aged Out   TETANUS/TDAP  Discontinued    Health Maintenance  Health Maintenance Due  Topic Date Due   Zoster Vaccines- Shingrix (1 of 2) Never done   DEXA SCAN  Never done    OPHTHALMOLOGY EXAM  06/01/2020   COVID-19 Vaccine (4 - Booster for Pfizer series) 07/03/2020   INFLUENZA VACCINE  01/08/2021    Colorectal cancer screening: No longer required.   Mammogram status: No longer required due to age.  Bone Density  status: Ordered 01/08/2021. Pt provided with contact info and advised to call to schedule appt.  Lung Cancer Screening: (Low Dose CT Chest recommended if Age 68-80 years, 30 pack-year currently smoking OR have quit w/in 15years.) does not qualify.   Lung Cancer Screening Referral: n/a  Additional Screening:  Hepatitis C Screening: does not qualify;   Vision Screening: Recommended annual ophthalmology exams for early detection of glaucoma and other disorders of the eye. Is the patient up to date with their annual eye exam?  Yes  Who is the provider or what is the name of the office in which the patient attends annual eye exams? Dr.Grote  If pt is not established with a provider, would they like to be referred to a provider to establish care? No .   Dental Screening: Recommended annual dental exams for proper oral hygiene  Community Resource Referral / Chronic Care Management: CRR required this visit?  No   CCM required this visit?  No      Plan:     I have personally reviewed and noted the following in the patient's chart:   Medical and social history Use of alcohol, tobacco or illicit drugs  Current medications and supplements including opioid prescriptions.  Functional ability and status Nutritional status Physical activity Advanced directives List of other physicians Hospitalizations, surgeries, and ER visits in previous 12 months Vitals Screenings to include cognitive, depression, and falls Referrals and appointments  In addition, I have reviewed and discussed with patient certain preventive protocols, quality metrics, and best practice recommendations. A written personalized care plan for preventive services as well as  general preventive health recommendations were provided to patient.     [name], LPN   08/09/7612   Nurse Notes: none

## 2021-01-15 ENCOUNTER — Encounter: Payer: Self-pay | Admitting: Family Medicine

## 2021-01-15 ENCOUNTER — Other Ambulatory Visit: Payer: Self-pay

## 2021-01-15 ENCOUNTER — Ambulatory Visit (INDEPENDENT_AMBULATORY_CARE_PROVIDER_SITE_OTHER): Payer: Medicare Other | Admitting: Family Medicine

## 2021-01-15 VITALS — BP 130/80 | HR 71 | Resp 16 | Ht 63.0 in | Wt 133.0 lb

## 2021-01-15 DIAGNOSIS — D329 Benign neoplasm of meninges, unspecified: Secondary | ICD-10-CM | POA: Diagnosis not present

## 2021-01-15 DIAGNOSIS — I739 Peripheral vascular disease, unspecified: Secondary | ICD-10-CM | POA: Insufficient documentation

## 2021-01-15 DIAGNOSIS — G63 Polyneuropathy in diseases classified elsewhere: Secondary | ICD-10-CM | POA: Diagnosis not present

## 2021-01-15 DIAGNOSIS — E1149 Type 2 diabetes mellitus with other diabetic neurological complication: Secondary | ICD-10-CM | POA: Diagnosis not present

## 2021-01-15 DIAGNOSIS — I10 Essential (primary) hypertension: Secondary | ICD-10-CM | POA: Diagnosis not present

## 2021-01-15 DIAGNOSIS — I499 Cardiac arrhythmia, unspecified: Secondary | ICD-10-CM | POA: Diagnosis not present

## 2021-01-15 LAB — MICROALBUMIN / CREATININE URINE RATIO
Creatinine,U: 112 mg/dL
Microalb Creat Ratio: 0.6 mg/g (ref 0.0–30.0)
Microalb, Ur: <0.7 mg/dL (ref 0.0–1.9)

## 2021-01-15 NOTE — Patient Instructions (Addendum)
A few things to remember from today's visit:   Type 2 diabetes mellitus with neurological complications ([place])  Hyperlipidemia associated with type 2 diabetes mellitus ([place])  Primary hypertension  PAD (peripheral artery disease) ([place])  We will do blood work next visit, fasting. No changes in medications today. Continue monitoring blood pressure and sugar.   If you need refills please call your pharmacy. Do not use My Chart to request refills or for acute issues that need immediate attention.    Please be sure medication list is accurate. If a new problem present, please set up appointment sooner than planned today.

## 2021-01-15 NOTE — Assessment & Plan Note (Signed)
Following with neurosurgeon annually. She is waiting for last brain MRI results.

## 2021-01-15 NOTE — Assessment & Plan Note (Signed)
Problem has been well controlled with nonpharmacologic treatment. Continue monitoring BS-times per week. Eye exam is up-to-date.

## 2021-01-15 NOTE — Progress Notes (Signed)
HPI:  Chief Complaint  Patient presents with   Follow-up   Julie Schmidt is a 80 y.o. female, who is here today with her daughter for chronic disease management.  Last seen on 12/25/20 for elevated blood pressure; Losartan 25 mg daily was started. BP in the ER 179/102 and 195/81.  BP readings at home:Wrist BP monitor  Side effects: None Negative for unusual or severe headache, visual changes, exertional chest pain, dyspnea,  focal weakness, or edema.  Lab Results  Component Value Date   CREATININE 0.79 01/05/2021   BUN 13 01/05/2021   NA 140 01/05/2021   K 3.9 01/05/2021   CL 103 01/05/2021   CO2 28 01/05/2021   Meningioma: She follows with neurosurgeon annually. She recently had a brain MRI and result is still pending.Accoridng to pt, problem has been stable.  PAD: Negative for leg pain/claudication like symptom. ABI 02/17/20: Right: Resting right ankle-brachial index is within normal range. No evidence of significant right lower extremity arterial disease. The right toe-brachial index is abnormal.  Left: Resting left ankle-brachial index is within normal range. No evidence of significant left lower extremity arterial disease. The left toe-brachial index is abnormal. Currently on Pravastatin 20 mg daily, resumed in 09/2020, and Aspirin 81 mg daily  Lab Results  Component Value Date   CHOL 204 (H) 09/13/2020   HDL 55.40 09/13/2020   LDLCALC 136 (H) 09/13/2020   TRIG 60.0 09/13/2020   CHOLHDL 4 09/13/2020   Diabetes Mellitus II: Dx'ed 2003. - Checking BG at home: 130-140's and in the afternoon 100-110's. - Medications: non pharmacological treatment - Diet: Yes - Exercise: She does not have an exercise routine but she is active with chores. - eye exam: 12/28/20 - foot exam: 09/13/20 - microalbumin: 12/07/19 - Negative for symptoms of hypoglycemia, polyuria, polydipsia, foot ulcers/trauma  Peripheral neuropathy: On 09/13/20 Duloxetine 30 mg was started, it has helped with  burning feet sensation.  Her feet are "fine." Gabapentin did not help.  Lab Results  Component Value Date   HGBA1C 6.3 09/13/2020   Lab Results  Component Value Date   MICROALBUR 1.3 12/07/2019   Review of Systems  Constitutional: Negative for activity change, appetite change, fatigue and fever.  HENT: Negative for mouth sores, nosebleeds and trouble swallowing.   Respiratory: Negative for cough and wheezing.   Gastrointestinal: Negative for abdominal pain, nausea and vomiting.       Negative for changes in bowel habits.  Genitourinary: Negative for decreased urine volume, difficulty urinating, and hematuria.  Musculoskeletal: Negative for gait problem and myalgias.  Skin: Negative for color change and rash.  Neurological: Negative for syncope, weakness and facial asymmetry. Psychiatric/Behavioral: Negative for confusion and anxiety.  Rest of ROS see pertinent positives and negatives in HPI.  Current Outpatient Medications on File Prior to Visit  Medication Sig Dispense Refill   Accu-Chek Softclix Lancets lancets Use to check blood sugar daily. Dx:e11.49 100 each 12   aspirin 81 MG tablet Take 81 mg by mouth daily.     Blood Glucose Monitoring Suppl (ACCU-CHEK AVIVA CONNECT) w/Device KIT 1 Device by Does not apply route daily. 1 kit 0   Blood Glucose Monitoring Suppl (ACCU-CHEK GUIDE ME) w/Device KIT Use to check blood sugars daily. 1 kit 0   Cholecalciferol (VITAMIN D3) 2000 units TABS Take by mouth daily.     Continuous Blood Gluc Receiver (DEXCOM G6 RECEIVER) DEVI Use to check blood sugars up to 3 times daily. 1 each 1  dorzolamide-timolol (COSOPT) 22.3-6.8 MG/ML ophthalmic solution Place 1 drop into both eyes 2 (two) times daily.     DULoxetine (CYMBALTA) 30 MG capsule Take 1 capsule (30 mg total) by mouth daily. 30 capsule 2   glucose blood (ACCU-CHEK GUIDE) test strip Use to check blood sugars daily. Dx:e11.49 100 each 12   ibuprofen (ADVIL,MOTRIN) 200 MG tablet Take 200 mg  by mouth every 6 (six) hours as needed.     Lancets Misc. (ACCU-CHEK SOFTCLIX LANCET DEV) KIT 1 Device by Does not apply route daily. 100 kit 3   latanoprost (XALATAN) 0.005 % ophthalmic solution 1 drop at bedtime.     losartan (COZAAR) 25 MG tablet Take 1 tablet (25 mg total) by mouth daily. 30 tablet 1   pantoprazole (PROTONIX) 40 MG tablet Take 1 tablet (40 mg total) by mouth daily. 90 tablet 2   polyethylene glycol powder (GLYCOLAX/MIRALAX) powder MIX 255 GRAM PO ONCE FOR 1 DOSE  0   pravastatin (PRAVACHOL) 20 MG tablet Take 1 tablet (20 mg total) by mouth daily. 90 tablet 2   No current facility-administered medications on file prior to visit.   Past Medical History:  Diagnosis Date   Arthritis    knees, back   Diabetes mellitus    Headache    Hypertension    Allergies  Allergen Reactions   Norvasc [Amlodipine] Other (See Comments)    Dizziness and Chest pains at night when lies down   Oxycodone-Acetaminophen Nausea And Vomiting    Social History   Socioeconomic History   Marital status: Widowed    Spouse name: Not on file   Number of children: Not on file   Years of education: Not on file   Highest education level: Not on file  Occupational History   Not on file  Tobacco Use   Smoking status: Never   Smokeless tobacco: Never  Substance and Sexual Activity   Alcohol use: No    Alcohol/week: 0.0 standard drinks   Drug use: No   Sexual activity: Not on file  Other Topics Concern   Not on file  Social History Narrative   Not on file   Social Determinants of Health   Financial Resource Strain: Low Risk    Difficulty of Paying Living Expenses: Not hard at all  Food Insecurity: No Food Insecurity   Worried About [place] in the Last Year: Never true   [place] in the Last Year: Never true  Transportation Needs: No Transportation Needs   Lack of Transportation (Medical): No   Lack of Transportation (Non-Medical): No  Physical Activity:  Insufficiently Active   Days of Exercise per Week: 5 days   Minutes of Exercise per Session: 10 min  Stress: No Stress Concern Present   Feeling of Stress : Not at all  Social Connections: Moderately Isolated   Frequency of Communication with Friends and Family: Twice a week   Frequency of Social Gatherings with Friends and Family: Twice a week   Attends Religious Services: 1 to 4 times per year   Active Member of [employer] or Organizations: No   Attends Archivist Meetings: Never   Marital Status: Never married   Today's Vitals   01/15/21 1355  BP: 130/80  Pulse: 71  Resp: 16  SpO2: 97%  Weight: 133 lb (60.3 kg)  Height: _0  (1.6 m)   Body mass index is 23.56 kg/m.  Physical Exam  Nursing note and vitals reviewed. Constitutional: She  is oriented to person, place, and time. She appears well-developed. No distress.  HENT:  Head: Normocephalic and atraumatic.  Mouth/Throat: Oropharynx is clear and moist and mucous membranes are normal.  Eyes: Conjunctivae are normal.  Cardiovascular: Normal rate and mildly irregular rhythm. Occasional extrasystoles. No murmur heard. Pulses:      Dorsalis pedis pulses are 2+ on the right side, and 2+ on the left side.  Respiratory: Effort normal and breath sounds normal. No respiratory distress.  GI: Soft. She exhibits no mass. There is no hepatomegaly. There is no tenderness.  Musculoskeletal: She exhibits no edema.  Lymphadenopathy:    She has no cervical adenopathy.  Neurological: She is alert and oriented to person, place, and time. She has normal strength. No cranial nerve deficit. Gait normal.  Skin: Skin is warm. No rash noted. No erythema.  Psychiatric: She has a normal mood and affect.  Well groomed, good eye contact.   ASSESSMENT AND PLAN:  Julie Schmidt was seen today for follow-up.  Diagnoses and all orders for this visit: Orders Placed This Encounter  Procedures   Microalbumin / creatinine urine ratio   Lab Results   Component Value Date   MICROALBUR <0.7 01/15/2021   Primary hypertension BP rechecked 132/70. We checked a couple times with her wrist BP monitor, when done appropriately BP was 129/80. Educated about appropriate technique . Continue monitoring BP. Low salt diet. Continue Losartan 25 mg daily.  Irregular heart rate Mild, asymptomatic. Hx of PVC's. Instructed about warning signs.  Type 2 diabetes mellitus with neurological complications ([place]) Problem has been well controlled with nonpharmacologic treatment. Continue monitoring BS-times per week. Eye exam is up-to-date.  PAD (peripheral artery disease) (HCC) Continue pravastatin 20 mg daily and Aspirin 81 mg daily.  Polyneuropathy associated with underlying disease ([place]) Symptoms have improved with duloxetine 30 mg daily. No changes in current management. Continue appropriate foot care.  Meningioma Following with neurosurgeon annually. She is waiting for last brain MRI results.  I spent a total of 40 minutes in both face to face and non face to face activities for this visit on the date of this encounter. During this time history was obtained and documented, examination was performed, prior labs reviewed, and assessment/plan discussed.  Return in about 3 months (around 04/17/2021) for DM II,HLD,HTN.   Kc Summerson [name], MD [facility]. [place] office.

## 2021-01-15 NOTE — Assessment & Plan Note (Signed)
Symptoms have improved with duloxetine 30 mg daily. No changes in current management. Continue appropriate foot care.

## 2021-01-15 NOTE — Assessment & Plan Note (Signed)
Continue pravastatin 20 mg daily and Aspirin 81 mg daily.

## 2021-01-18 MED ORDER — LOSARTAN POTASSIUM 25 MG PO TABS: 25.0000 mg | ORAL_TABLET | Freq: Every day | ORAL | 2 refills | Status: DC

## 2021-01-24 ENCOUNTER — Ambulatory Visit (INDEPENDENT_AMBULATORY_CARE_PROVIDER_SITE_OTHER): Payer: Medicare Other | Admitting: Family Medicine

## 2021-01-24 ENCOUNTER — Telehealth: Payer: Self-pay

## 2021-01-24 ENCOUNTER — Encounter: Payer: Self-pay | Admitting: Family Medicine

## 2021-01-24 ENCOUNTER — Other Ambulatory Visit: Payer: Self-pay

## 2021-01-24 VITALS — BP 142/78 | HR 66 | Temp 98.4°F | Wt 130.8 lb

## 2021-01-24 DIAGNOSIS — I10 Essential (primary) hypertension: Secondary | ICD-10-CM | POA: Diagnosis not present

## 2021-01-24 MED ORDER — LOSARTAN POTASSIUM 25 MG PO TABS: 37.5000 mg | ORAL_TABLET | Freq: Every day | ORAL | 3 refills | Status: DC

## 2021-01-24 NOTE — Telephone Encounter (Signed)
Message was sent to Dr Volanda [facility]

## 2021-01-24 NOTE — Progress Notes (Signed)
Subjective:    Patient ID: Julie Schmidt, female    DOB: [birth year], 80 y.o.   MRN: [id number]  Chief Complaint  Patient presents with   Hypertension    Elevated bp for a few day, wakes up with a headache  Pt accompanied by her daughter.  HPI Patient seen by Dr. [name], presents today for acute concern, elevated BP.  Pt waking up with a HA and bp elevation x last few wks.  Taking losartan 25 mg daily.  BP at home 150s sys.  Pt has wrist cuff.  Denies increased sodium intake.  Drinking 1 bottle of water per day and coffee.  Past Medical History:  Diagnosis Date   Arthritis    knees, back   Diabetes mellitus    Headache    Hypertension     Allergies  Allergen Reactions   Norvasc [Amlodipine] Other (See Comments)    Dizziness and Chest pains at night when lies down   Oxycodone-Acetaminophen Nausea And Vomiting    ROS General: Denies fever, chills, night sweats, changes in weight, changes in appetite HEENT: Denies ear pain, changes in vision, rhinorrhea, sore throat   +HA CV: Denies CP, palpitations, SOB, orthopnea Pulm: Denies SOB, cough, wheezing GI: Denies abdominal pain, nausea, vomiting, diarrhea, constipation GU: Denies dysuria, hematuria, frequency, vaginal discharge Msk: Denies muscle cramps, joint pains Neuro: Denies weakness, numbness, tingling Skin: Denies rashes, bruising Psych: Denies depression, anxiety, hallucinations     Objective:    Blood pressure (!) 142/78, pulse 66, temperature 98.4 F (36.9 C), temperature source Oral, weight 130 lb 12.8 oz (59.3 kg), SpO2 96 %.  Gen. Pleasant, well-nourished, in no distress, normal affect   HEENT: [place]/AT, face symmetric, conjunctiva clear, no scleral icterus, PERRLA, EOMI, nares patent without drainage Lungs: no accessory muscle use, CTAB, no wheezes or rales Cardiovascular: RRR, no m/r/g, no peripheral edema Musculoskeletal: No deformities, no cyanosis or clubbing, normal tone Neuro:  A&Ox3, CN II-XII intact,  normal gait Skin:  Warm, no lesions/ rash  Wt Readings from Last 3 Encounters:  01/24/21 130 lb 12.8 oz (59.3 kg)  01/15/21 133 lb (60.3 kg)  01/05/21 145 lb (65.8 kg)    Lab Results  Component Value Date   WBC 4.3 01/05/2021   HGB 11.3 (L) 01/05/2021   HCT 35.5 (L) 01/05/2021   PLT 275 01/05/2021   GLUCOSE 112 (H) 01/05/2021   CHOL 204 (H) 09/13/2020   TRIG 60.0 09/13/2020   HDL 55.40 09/13/2020   LDLCALC 136 (H) 09/13/2020   ALT 24 01/05/2021   AST 29 01/05/2021   NA 140 01/05/2021   K 3.9 01/05/2021   CL 103 01/05/2021   CREATININE 0.79 01/05/2021   BUN 13 01/05/2021   CO2 28 01/05/2021   TSH 1.47 05/07/2016   INR 1.0 11/24/2014   HGBA1C 6.3 09/13/2020   MICROALBUR <0.7 01/15/2021    Assessment/Plan:  Essential hypertension  -elevated -Low-sodium diet advised -Patient strongly encouraged to increase p.o. intake of water -We will increase losartan from 25 mg to 37.5 mg as bp controlled 130/80 at last OFV 01/15/21 -given precautions - Plan: losartan (COZAAR) 25 MG tablet  F/u in 2-3 weeks with PCP.  Grier Mitts, MD

## 2021-01-24 NOTE — Patient Instructions (Signed)
We have increased your dose of losartan from 25 mg to 37.5 mg daily.  This means you will now take a pill and a half each day.  It is important that you are drinking more water each day.  Continue checking your blood pressure daily.  If needed further adjustments to the dose can be made.

## 2021-01-25 ENCOUNTER — Telehealth: Payer: Self-pay | Admitting: Pharmacist

## 2021-01-25 NOTE — Chronic Care Management (AMB) (Signed)
    Chronic Care Management Pharmacy Assistant   Name: CAMRON ESSMAN  MRN: [id number] DOB: 1940/07/18  Call to patient for pharmacy clarification to see if she is needing them to send out her Losartan for her. Patient reports she has one tablet left has not taken yet for the day and her dose has been increased to one and a half tablets daily. Forwarded information to pharmacy to coordinate delivery. Pharmacy confirmed a short fill and will deliver  [place] Pharmacist Assistant [phone]    Medications: Outpatient Encounter Medications as of 01/25/2021  Medication Sig   Accu-Chek Softclix Lancets lancets Use to check blood sugar daily. Dx:e11.49   aspirin 81 MG tablet Take 81 mg by mouth daily.   Blood Glucose Monitoring Suppl (ACCU-CHEK AVIVA CONNECT) w/Device KIT 1 Device by Does not apply route daily.   Blood Glucose Monitoring Suppl (ACCU-CHEK GUIDE ME) w/Device KIT Use to check blood sugars daily.   Cholecalciferol (VITAMIN D3) 2000 units TABS Take by mouth daily.   Continuous Blood Gluc Receiver (DEXCOM G6 RECEIVER) DEVI Use to check blood sugars up to 3 times daily.   dorzolamide-timolol (COSOPT) 22.3-6.8 MG/ML ophthalmic solution Place 1 drop into both eyes 2 (two) times daily.   DULoxetine (CYMBALTA) 30 MG capsule Take 1 capsule (30 mg total) by mouth daily.   glucose blood (ACCU-CHEK GUIDE) test strip Use to check blood sugars daily. Dx:e11.49   ibuprofen (ADVIL,MOTRIN) 200 MG tablet Take 200 mg by mouth every 6 (six) hours as needed.   Lancets Misc. (ACCU-CHEK SOFTCLIX LANCET DEV) KIT 1 Device by Does not apply route daily.   latanoprost (XALATAN) 0.005 % ophthalmic solution 1 drop at bedtime.   losartan (COZAAR) 25 MG tablet Take 1.5 tablets (37.5 mg total) by mouth daily.   pantoprazole (PROTONIX) 40 MG tablet Take 1 tablet (40 mg total) by mouth daily.   polyethylene glycol powder (GLYCOLAX/MIRALAX) powder MIX 255 GRAM PO ONCE FOR 1 DOSE   pravastatin  (PRAVACHOL) 20 MG tablet Take 1 tablet (20 mg total) by mouth daily.   No facility-administered encounter medications on file as of 01/25/2021.

## 2021-01-26 ENCOUNTER — Telehealth: Payer: Self-pay | Admitting: Pharmacist

## 2021-01-26 NOTE — Chronic Care Management (AMB) (Signed)
Chronic Care Management Pharmacy Assistant   Name: Julie Schmidt  MRN: [id number] DOB: 23-Apr-1941  Reason for Encounter: Medication Review Medication Coordination Call and NO fill report   Recent office visits:  01-24-2021 Julie Ruddy, MD - Patient presented for hypertension. Increased Losartan to 37.5 mg daily.  01-15-2021 [name], [name], MD - Patient presented for Type 2 diabetes mellitus with neurological complications. No medication changes.   01-08-2021 Julie Pigg, LPN - Patient presented for Medicare Annual Wellness exam. No medication changes.   Recent consult visits:  None  Hospital visits:  Medication Reconciliation was completed by comparing discharge summary, patient's EMR and Pharmacy list, and upon discussion with patient.  Patient presented to [facility] ED on 01-05-2021 due to COVID infection. She was there for 8 hours.  New?Medications Started at [facility] Discharge:?? Paxlavoid Take 3 tablets by mouth 2 (two) times daily for 5 days.  Medication Changes at Hospital Discharge: None  Medications Discontinued at Hospital Discharge: None  Medications that remain the same after Hospital Discharge:??  -All other medications will remain the same.    Medications: Outpatient Encounter Medications as of 01/26/2021  Medication Sig   Accu-Chek Softclix Lancets lancets Use to check blood sugar daily. Dx:e11.49   aspirin 81 MG tablet Take 81 mg by mouth daily.   Blood Glucose Monitoring Suppl (ACCU-CHEK AVIVA CONNECT) w/Device KIT 1 Device by Does not apply route daily.   Blood Glucose Monitoring Suppl (ACCU-CHEK GUIDE ME) w/Device KIT Use to check blood sugars daily.   Cholecalciferol (VITAMIN D3) 2000 units TABS Take by mouth daily.   Continuous Blood Gluc Receiver (DEXCOM G6 RECEIVER) DEVI Use to check blood sugars up to 3 times daily.   dorzolamide-timolol (COSOPT) 22.3-6.8 MG/ML ophthalmic solution Place 1 drop into both eyes 2 (two) times  daily.   DULoxetine (CYMBALTA) 30 MG capsule Take 1 capsule (30 mg total) by mouth daily.   glucose blood (ACCU-CHEK GUIDE) test strip Use to check blood sugars daily. Dx:e11.49   ibuprofen (ADVIL,MOTRIN) 200 MG tablet Take 200 mg by mouth every 6 (six) hours as needed.   Lancets Misc. (ACCU-CHEK SOFTCLIX LANCET DEV) KIT 1 Device by Does not apply route daily.   latanoprost (XALATAN) 0.005 % ophthalmic solution 1 drop at bedtime.   losartan (COZAAR) 25 MG tablet Take 1.5 tablets (37.5 mg total) by mouth daily.   pantoprazole (PROTONIX) 40 MG tablet Take 1 tablet (40 mg total) by mouth daily.   polyethylene glycol powder (GLYCOLAX/MIRALAX) powder MIX 255 GRAM PO ONCE FOR 1 DOSE   pravastatin (PRAVACHOL) 20 MG tablet Take 1 tablet (20 mg total) by mouth daily.   No facility-administered encounter medications on file as of 01/26/2021.  Reviewed chart for medication changes ahead of medication coordination call.  COVID infection hospital visits since last care coordination call/Pharmacist visit on 01-05-21  No medication changes indicated   BP Readings from Last 3 Encounters:  01/24/21 (!) 142/78  01/15/21 130/80  01/05/21 (!) 195/81    Lab Results  Component Value Date   HGBA1C 6.3 09/13/2020     Patient obtains medications through Adherence Packaging  90 Days   Last adherence delivery included:  Duloxetine 30 mg: one capsule daily   Patient declined the following medications  due to the following reason Pravastatin (PRAVACHOL) 20 MG tablet last filled 06.08.22 for 90 DS Pantoprazole (PROTONIX) 40 MG tablet last filled 06.08.22 for 90 DS Losartan (COZAAR) 25 MG tablet last filled 07.20.22 for 30  DS Dorzolamide-timolol (COSOPT) 22.3-6.8 MG/ML ophthalmic solution last filled 06.22.22 for 50 days  Patient is due for next adherence delivery on: 02-09-2021. Called patient and reviewed medications and coordinated delivery.  This delivery to include: Pravastatin (PRAVACHOL) 20 MG tablet  last filled 06.08.22 for 90 DS Pantoprazole (PROTONIX) 40 MG tablet last filled 06.08.22 for 90 DS Added to form Losartan (COZAAR) 37.5 MG - take one and a half tab daily; Dose was increased and med was short filled for 17 DS on 01-25-2021 Added Dorzolamide-timolol (COSOPT) 22.3-6.8 MG/ML ophthalmic solution she is almost out   Patient needs refills for Dorzolamide-timolol (COSOPT) 22.3-6.8 MG/ML ophthalmic solution last filled 06.22.22 for 50 days.  Confirmed delivery date of 02-09-21, advised patient that pharmacy will contact them the morning of delivery.   Confirmed she is using Packaging for 90 DS  No fill report confirmed usage of : Dorzolamide-timolol (COSOPT) 22.3-6.8 MG/ML ophthalmic solution last filled 06.22.22 for 50 days  Care Gaps:  Star Rating Drugs: Pravastatin (Pravachol) 20 mg  Losartan (Cozaar) 20 mg   [name] CMA Clinical Pharmacist Assistant [phone]  

## 2021-02-01 DIAGNOSIS — I10 Essential (primary) hypertension: Secondary | ICD-10-CM | POA: Diagnosis not present

## 2021-02-01 DIAGNOSIS — D329 Benign neoplasm of meninges, unspecified: Secondary | ICD-10-CM | POA: Diagnosis not present

## 2021-02-01 DIAGNOSIS — R030 Elevated blood-pressure reading, without diagnosis of hypertension: Secondary | ICD-10-CM | POA: Diagnosis not present

## 2021-02-01 DIAGNOSIS — R519 Headache, unspecified: Secondary | ICD-10-CM | POA: Diagnosis not present

## 2021-02-02 ENCOUNTER — Telehealth: Payer: Self-pay | Admitting: Pharmacist

## 2021-02-02 NOTE — Progress Notes (Addendum)
Chronic Care Management Pharmacy Assistant   Name: Julie Schmidt  MRN: [id number] DOB: 04/13/1941   Per Clinical Pharmacist call to patient to see if she is taking and wants her Duloxetine added to her upcoming delivery of her medications. Spoke to patient she noted she is taking and wants it included in her delivery, updated her pharmacy form to include on 02-09-21 delivery  [name] CMA Clinical Pharmacist Assistant [phone]   Medications: Outpatient Encounter Medications as of 02/02/2021  Medication Sig   Accu-Chek Softclix Lancets lancets Use to check blood sugar daily. Dx:e11.49   aspirin 81 MG tablet Take 81 mg by mouth daily.   Blood Glucose Monitoring Suppl (ACCU-CHEK AVIVA CONNECT) w/Device KIT 1 Device by Does not apply route daily.   Blood Glucose Monitoring Suppl (ACCU-CHEK GUIDE ME) w/Device KIT Use to check blood sugars daily.   Cholecalciferol (VITAMIN D3) 2000 units TABS Take by mouth daily.   Continuous Blood Gluc Receiver (DEXCOM G6 RECEIVER) DEVI Use to check blood sugars up to 3 times daily.   dorzolamide-timolol (COSOPT) 22.3-6.8 MG/ML ophthalmic solution Place 1 drop into both eyes 2 (two) times daily.   DULoxetine (CYMBALTA) 30 MG capsule Take 1 capsule (30 mg total) by mouth daily.   glucose blood (ACCU-CHEK GUIDE) test strip Use to check blood sugars daily. Dx:e11.49   ibuprofen (ADVIL,MOTRIN) 200 MG tablet Take 200 mg by mouth every 6 (six) hours as needed.   Lancets Misc. (ACCU-CHEK SOFTCLIX LANCET DEV) KIT 1 Device by Does not apply route daily.   latanoprost (XALATAN) 0.005 % ophthalmic solution 1 drop at bedtime.   losartan (COZAAR) 25 MG tablet Take 1.5 tablets (37.5 mg total) by mouth daily.   pantoprazole (PROTONIX) 40 MG tablet Take 1 tablet (40 mg total) by mouth daily.   polyethylene glycol powder (GLYCOLAX/MIRALAX) powder MIX 255 GRAM PO ONCE FOR 1 DOSE   pravastatin (PRAVACHOL) 20 MG tablet Take 1 tablet (20 mg total) by mouth daily.    No facility-administered encounter medications on file as of 02/02/2021.    Care Gaps: Zoster Vaccine - Overdue Eye Exam - Overdue COVID Booster#4(Pfizer) - Overdue Flu Vaccine - Overdue  Star Rating Drugs: Pravastatin (Pravachol) 20 mg - Last filled 11-10-20 90 DS at [facility] Losartan (Cozaar) 20 mg - Last filled 01-25-21 17 DS at [facility]  [name] CMA Clinical Pharmacist Assistant [phone]  

## 2021-02-05 ENCOUNTER — Telehealth: Payer: Self-pay

## 2021-02-05 DIAGNOSIS — G63 Polyneuropathy in diseases classified elsewhere: Secondary | ICD-10-CM

## 2021-02-05 MED ORDER — DULOXETINE HCL 30 MG PO CPEP: 30.0000 mg | ORAL_CAPSULE | Freq: Every day | ORAL | 1 refills | Status: DC

## 2021-02-05 NOTE — Telephone Encounter (Signed)
-----   Message from Viona Gilmore, [facility] sent at 02/03/2021  1:13 AM EDT ----- Regarding: Duloxetine refill Hi,  Can you please send in a refill for a 90 days supply for duloxetine to [facility] pharmacy for Ms. Hurston? She is in packaging for 90 ds and it is set to go out this week.  Thanks, Maddie

## 2021-02-20 ENCOUNTER — Other Ambulatory Visit: Payer: Self-pay

## 2021-02-21 ENCOUNTER — Encounter: Payer: Self-pay | Admitting: Family Medicine

## 2021-02-21 ENCOUNTER — Ambulatory Visit (INDEPENDENT_AMBULATORY_CARE_PROVIDER_SITE_OTHER): Payer: Medicare Other | Admitting: Family Medicine

## 2021-02-21 VITALS — BP 140/80 | HR 70 | Resp 16 | Ht 63.0 in | Wt 132.5 lb

## 2021-02-21 DIAGNOSIS — E785 Hyperlipidemia, unspecified: Secondary | ICD-10-CM

## 2021-02-21 DIAGNOSIS — E1169 Type 2 diabetes mellitus with other specified complication: Secondary | ICD-10-CM

## 2021-02-21 DIAGNOSIS — I10 Essential (primary) hypertension: Secondary | ICD-10-CM | POA: Diagnosis not present

## 2021-02-21 DIAGNOSIS — Z23 Encounter for immunization: Secondary | ICD-10-CM

## 2021-02-21 DIAGNOSIS — I739 Peripheral vascular disease, unspecified: Secondary | ICD-10-CM | POA: Diagnosis not present

## 2021-02-21 DIAGNOSIS — E1149 Type 2 diabetes mellitus with other diabetic neurological complication: Secondary | ICD-10-CM | POA: Diagnosis not present

## 2021-02-21 LAB — HEMOGLOBIN A1C: Hgb A1c MFr Bld: 6.5 % (ref 4.6–6.5)

## 2021-02-21 LAB — LIPID PANEL
Cholesterol: 157 mg/dL (ref 0–200)
HDL: 47.5 mg/dL (ref >39.00)
LDL Cholesterol: 89 mg/dL (ref 0–99)
NonHDL: 109.33
Total CHOL/HDL Ratio: 3
Triglycerides: 104 mg/dL (ref 0.0–149.0)
VLDL: 20.8 mg/dL (ref 0.0–40.0)

## 2021-02-21 MED ORDER — LOSARTAN POTASSIUM 50 MG PO TABS: 50.0000 mg | ORAL_TABLET | Freq: Every day | ORAL | 0 refills | Status: DC

## 2021-02-21 NOTE — Assessment & Plan Note (Signed)
BPs at home still running mildly elevated. Recommend increasing losartan dose from 37.5 mg to 50 mg daily. Continue low-salt diet. Continue monitoring BP regularly. Eye exam is current. Follow-up in 4 to 6 weeks, before leaving [place].

## 2021-02-21 NOTE — Assessment & Plan Note (Signed)
HgA1C has been at goal. Continue nonpharmacologic treatment. Encouraged to continue regular physical activity and a healthful diet. Annual eye exam and foot care recommended. F/U in 5-6 months

## 2021-02-21 NOTE — Patient Instructions (Addendum)
A few things to remember from today's visit:   Type 2 diabetes mellitus with neurological complications ([place]) - Plan: Hemoglobin A1c, Fructosamine  Essential hypertension - Plan: losartan (COZAAR) 50 MG tablet  Hyperlipidemia associated with type 2 diabetes mellitus ([place]) - Plan: Lipid panel  PAD (peripheral artery disease) (HCC)  Losartan increased to 50 mg daily. Continue monitoring blood pressure.  If you need refills please call your pharmacy. Do not use My Chart to request refills or for acute issues that need immediate attention.   You can try to re-schedule your bone density, this is the number:[phone] Please be sure medication list is accurate. If a new problem present, please set up appointment sooner than planned today.

## 2021-02-21 NOTE — Assessment & Plan Note (Addendum)
Peripheral pulses present. Continue pravastatin 20 mg daily and Aspirin 81 mg daily. Further recommendation will be given according to lipid panel result.

## 2021-02-21 NOTE — Progress Notes (Signed)
HPI: Julie Schmidt is a 80 y.o. female, who is here today for follow up.   I saw her last on 01/15/2021, losartan 25 mg was started. She was seen here in the office also on 01/24/2021, when losartan was increased from 25 mg daily to 37.5 mg daily. She is tolerating medication well. Home BP readings 130s to 150s/80s. Negative for severe/frequent headache, visual changes, chest pain, dyspnea, palpitation, claudication, focal weakness, or edema. Lab Results  Component Value Date   CREATININE 0.79 01/05/2021   BUN 13 01/05/2021   NA 140 01/05/2021   K 3.9 01/05/2021   CL 103 01/05/2021   CO2 28 01/05/2021   Diabetes Mellitus II:  - Checking BG at home: 90-110 - Medications: She is on nonpharmacologic treatment. - Diet: Walking a few times per week. - Exercise: She is walking a few times per week. - eye exam: 12/28/2020, negative for diabetic retinopathy.  She is being treated for right eye glaucoma. - foot exam: 09/2020.  - Negative for symptoms of hypoglycemia, polyuria, polydipsia, foot ulcers/trauma Peripheral neuropathy, symptoms have improved with Cymbalta 30 mg daily.  Lab Results  Component Value Date   HGBA1C 6.3 09/13/2020   Lab Results  Component Value Date   MICROALBUR <0.7 01/15/2021   Hyperlipidemia: Currently she is on pravastatin 20 mg daily. She has tolerated medication well. Following low-fat diet. PAD, taking Aspirin 81 mg daily. 02/2020 ABI: Right: Resting right ankle-brachial index is within normal range. No evidence of significant right lower extremity arterial disease. The right toe-brachial index is abnormal.  Left: Resting left ankle-brachial index is within normal range. No evidence of significant left lower extremity arterial disease. The left toe-brachial index is abnormal.   Lab Results  Component Value Date   CHOL 204 (H) 09/13/2020   HDL 55.40 09/13/2020   LDLCALC 136 (H) 09/13/2020   TRIG 60.0 09/13/2020   CHOLHDL 4 09/13/2020    Review of Systems  Constitutional:  Negative for activity change, appetite change, fatigue and fever.  HENT:  Negative for mouth sores, nosebleeds and sore throat.   Respiratory:  Negative for cough and wheezing.   Gastrointestinal:  Negative for abdominal pain, nausea and vomiting.       Negative for changes in bowel habits.  Genitourinary:  Negative for decreased urine volume, dysuria and hematuria.  Musculoskeletal:  Negative for gait problem and myalgias.  Neurological:  Negative for syncope, facial asymmetry and weakness.  Rest of ROS, see pertinent positives sand negatives in HPI  Current Outpatient Medications on File Prior to Visit  Medication Sig Dispense Refill   Accu-Chek Softclix Lancets lancets Use to check blood sugar daily. Dx:e11.49 100 each 12   aspirin 81 MG tablet Take 81 mg by mouth daily.     Blood Glucose Monitoring Suppl (ACCU-CHEK AVIVA CONNECT) w/Device KIT 1 Device by Does not apply route daily. 1 kit 0   Blood Glucose Monitoring Suppl (ACCU-CHEK GUIDE ME) w/Device KIT Use to check blood sugars daily. 1 kit 0   Cholecalciferol (VITAMIN D3) 2000 units TABS Take by mouth daily.     Continuous Blood Gluc Receiver (DEXCOM G6 RECEIVER) DEVI Use to check blood sugars up to 3 times daily. 1 each 1   dorzolamide-timolol (COSOPT) 22.3-6.8 MG/ML ophthalmic solution Place 1 drop into both eyes 2 (two) times daily.     DULoxetine (CYMBALTA) 30 MG capsule Take 1 capsule (30 mg total) by mouth daily. 90 capsule 1   glucose blood (ACCU-CHEK GUIDE)  test strip Use to check blood sugars daily. Dx:e11.49 100 each 12   ibuprofen (ADVIL,MOTRIN) 200 MG tablet Take 200 mg by mouth every 6 (six) hours as needed.     Lancets Misc. (ACCU-CHEK SOFTCLIX LANCET DEV) KIT 1 Device by Does not apply route daily. 100 kit 3   latanoprost (XALATAN) 0.005 % ophthalmic solution 1 drop at bedtime.     pantoprazole (PROTONIX) 40 MG tablet Take 1 tablet (40 mg total) by mouth daily. 90 tablet 2    polyethylene glycol powder (GLYCOLAX/MIRALAX) powder MIX 255 GRAM PO ONCE FOR 1 DOSE  0   pravastatin (PRAVACHOL) 20 MG tablet Take 1 tablet (20 mg total) by mouth daily. 90 tablet 2   No current facility-administered medications on file prior to visit.   Past Medical History:  Diagnosis Date   Arthritis    knees, back   Diabetes mellitus    Headache    Hypertension    Allergies  Allergen Reactions   Norvasc [Amlodipine] Other (See Comments)    Dizziness and Chest pains at night when lies down   Oxycodone-Acetaminophen Nausea And Vomiting   Social History   Socioeconomic History   Marital status: Widowed    Spouse name: Not on file   Number of children: Not on file   Years of education: Not on file   Highest education level: Not on file  Occupational History   Not on file  Tobacco Use   Smoking status: Never   Smokeless tobacco: Never  Substance and Sexual Activity   Alcohol use: No    Alcohol/week: 0.0 standard drinks   Drug use: No   Sexual activity: Not on file  Other Topics Concern   Not on file  Social History Narrative   Not on file   Social Determinants of Health   Financial Resource Strain: Low Risk    Difficulty of Paying Living Expenses: Not hard at all  Food Insecurity: No Food Insecurity   Worried About [place] in the Last Year: Never true   [place] in the Last Year: Never true  Transportation Needs: No Transportation Needs   Lack of Transportation (Medical): No   Lack of Transportation (Non-Medical): No  Physical Activity: Insufficiently Active   Days of Exercise per Week: 5 days   Minutes of Exercise per Session: 10 min  Stress: No Stress Concern Present   Feeling of Stress : Not at all  Social Connections: Moderately Isolated   Frequency of Communication with Friends and Family: Twice a week   Frequency of Social Gatherings with Friends and Family: Twice a week   Attends Religious Services: 1 to 4 times per year   Active  Member of [employer] or Organizations: No   Attends Archivist Meetings: Never   Marital Status: Never married   Vitals:   02/21/21 1411  BP: 140/80  Pulse: 70  Resp: 16  SpO2: 98%   Body mass index is 23.47 kg/m.  Physical Exam Vitals and nursing note reviewed.  Constitutional:      General: She is not in acute distress.    Appearance: She is well-developed.  HENT:     Head: Normocephalic and atraumatic.     Mouth/Throat:     Mouth: Mucous membranes are moist.     Pharynx: Oropharynx is clear.  Eyes:     Conjunctiva/sclera: Conjunctivae normal.  Cardiovascular:     Rate and Rhythm: Normal rate and regular rhythm.  Pulses:          Dorsalis pedis pulses are 2+ on the right side and 2+ on the left side.     Heart sounds: No murmur heard. Pulmonary:     Effort: Pulmonary effort is normal. No respiratory distress.     Breath sounds: Normal breath sounds.  Abdominal:     Palpations: Abdomen is soft. There is no hepatomegaly or mass.     Tenderness: There is no abdominal tenderness.  Lymphadenopathy:     Cervical: No cervical adenopathy.  Skin:    General: Skin is warm.     Findings: No erythema or rash.  Neurological:     General: No focal deficit present.     Mental Status: She is alert and oriented to person, place, and time.     Cranial Nerves: No cranial nerve deficit.     Gait: Gait normal.  Psychiatric:     Comments: Well groomed, good eye contact.   ASSESSMENT AND PLAN:  Ms. ZEKIAH COEN was seen today for follow-up.  Orders Placed This Encounter  Procedures   Flu Vaccine QUAD High Dose(Fluad)   Lipid panel   Hemoglobin A1c   Fructosamine   Lab Results  Component Value Date   HGBA1C 6.5 02/21/2021   Lab Results  Component Value Date   CHOL 157 02/21/2021   HDL 47.50 02/21/2021   LDLCALC 89 02/21/2021   TRIG 104.0 02/21/2021   CHOLHDL 3 02/21/2021   Type 2 diabetes mellitus with neurological complications ([place]) GEF2W has been at  goal. Continue nonpharmacologic treatment. Encouraged to continue regular physical activity and a healthful diet. Annual eye exam and foot care recommended. F/U in 5-6 months   Primary hypertension BPs at home still running mildly elevated. Recommend increasing losartan dose from 37.5 mg to 50 mg daily. Continue low-salt diet. Continue monitoring BP regularly. Eye exam is current. Follow-up in 4 to 6 weeks, before leaving [place].  PAD (peripheral artery disease) (HCC) Peripheral pulses present. Continue pravastatin 20 mg daily and Aspirin 81 mg daily. Further recommendation will be given according to lipid panel result.   Return in about 6 weeks (around 04/04/2021).   Sundus Pete G. [name], MD  [facility]. [place] office.

## 2021-02-24 IMAGING — US US ABDOMEN COMPLETE
1 series · 13 of 25 positions shown · non-contrast
Comparison: None.

CLINICAL DATA: Right upper quadrant abdominal pain

EXAM:
ABDOMEN ULTRASOUND COMPLETE

[Series 1: us abdomen complete · 0.19mm/px · 13 of 96 slices shown]
[im 1/96]
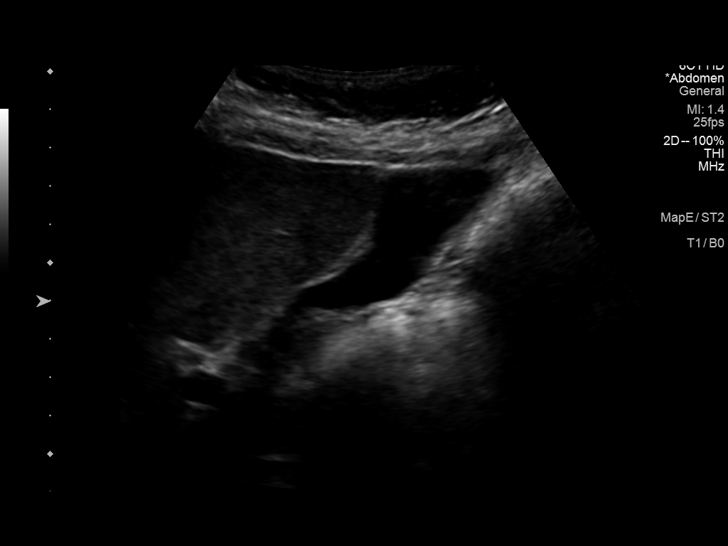
[im 8/96]
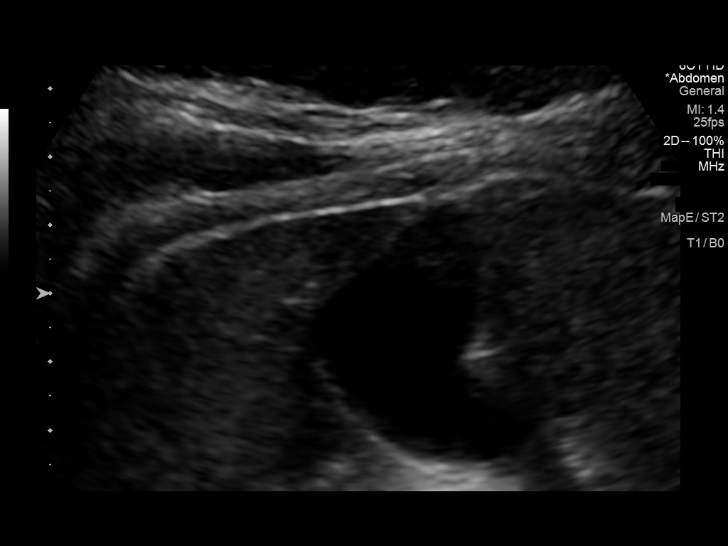
[im 16/96]
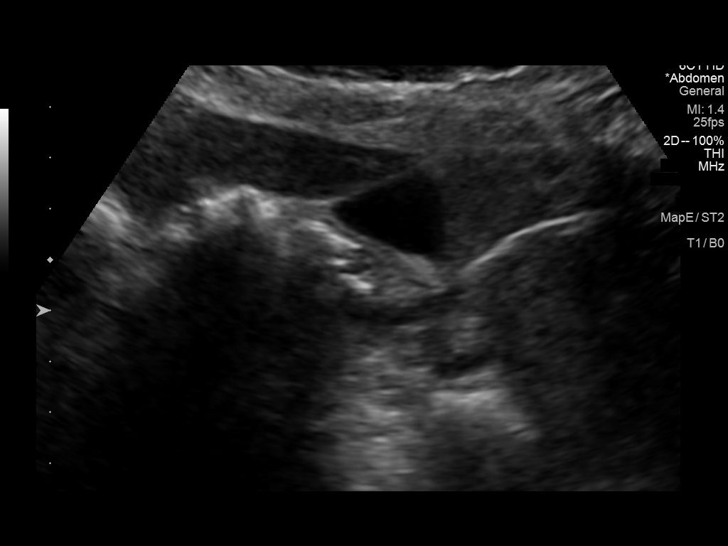
[im 24/96]
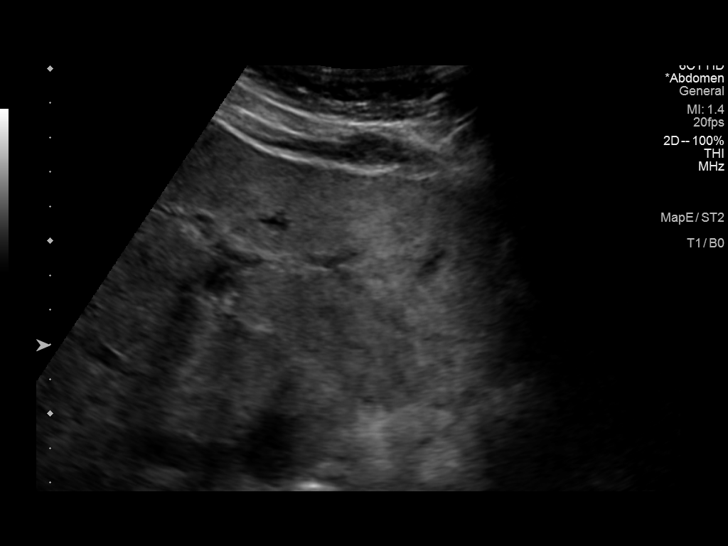
[im 32/96]
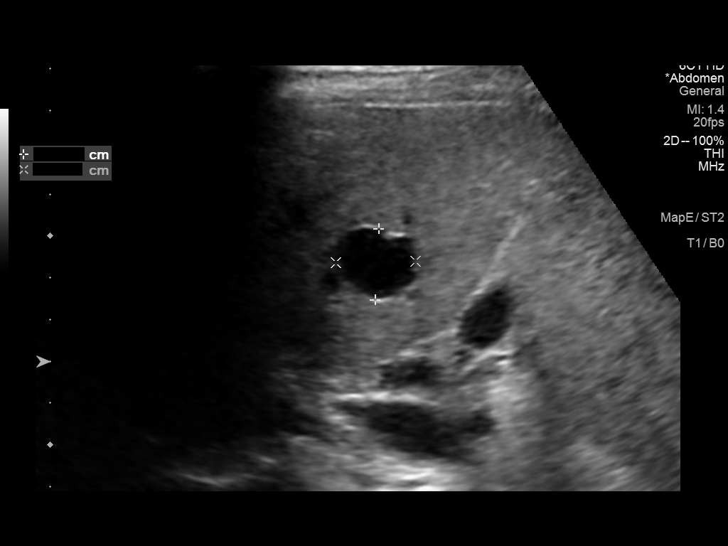
[im 40/96]
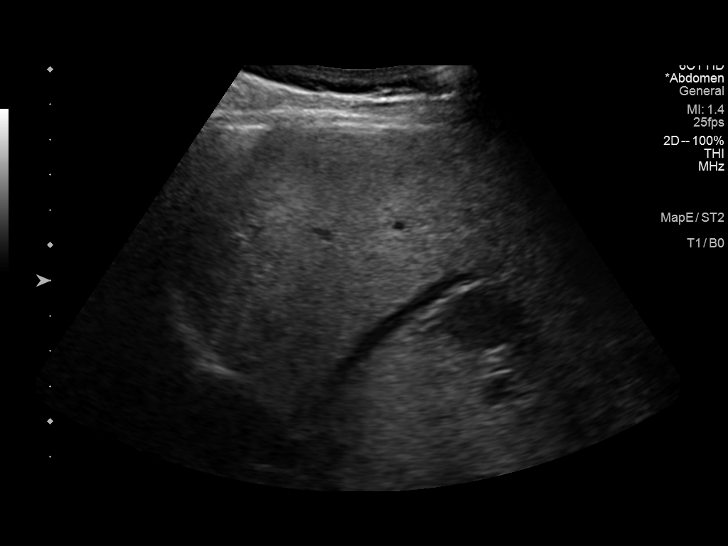
[im 48/96]
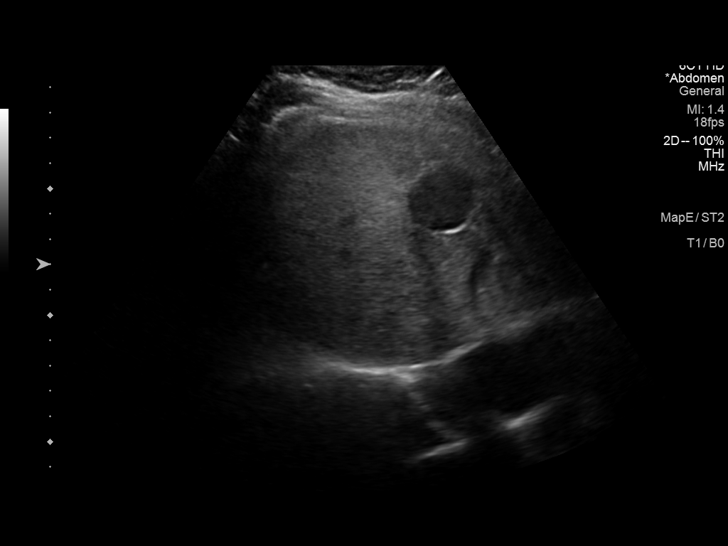
[im 56/96]
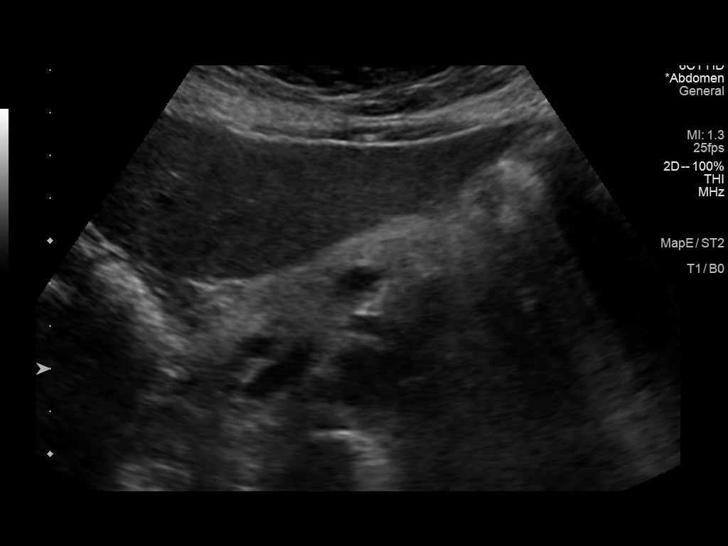
[im 64/96]
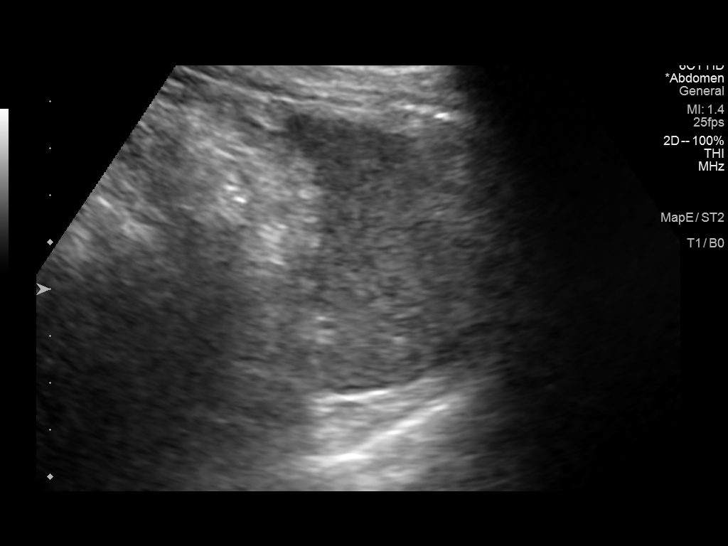
[im 72/96]
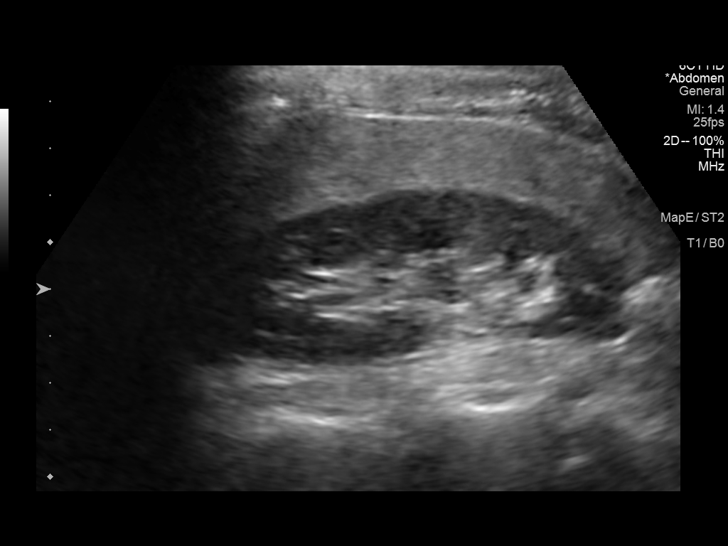
[im 80/96]
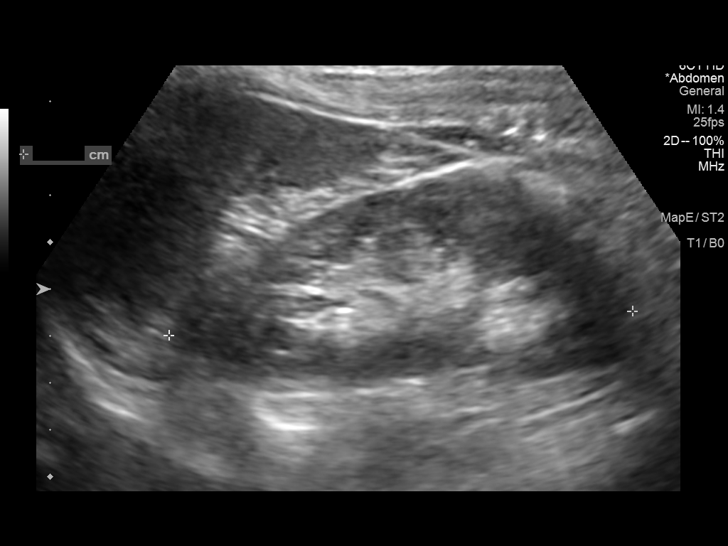
[im 88/96]
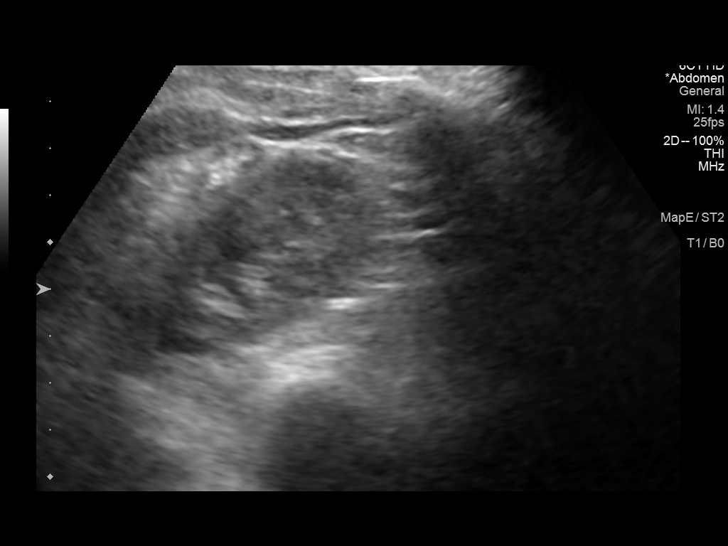
[im 96/96]
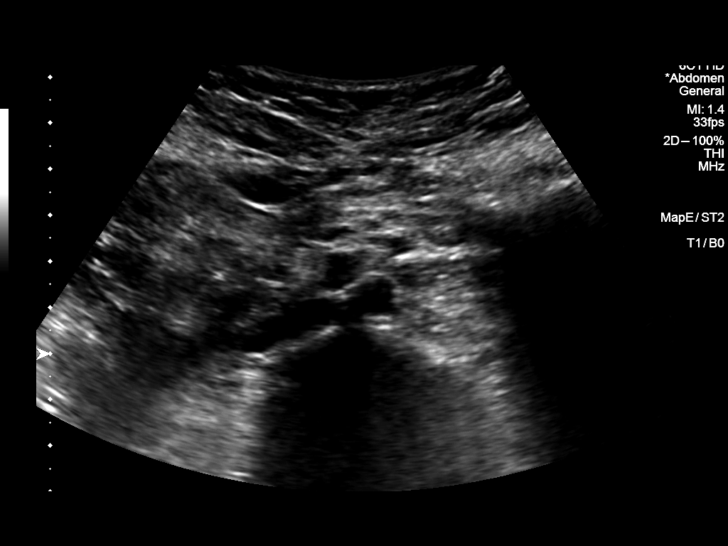

[13 of 25 positions shown; findings below may reference images not displayed]

FINDINGS: Gallbladder: No gallstones or wall thickening visualized. No
sonographic Murphy sign noted by sonographer.

Common bile duct: Diameter: 4 mm in mid diameter

Liver: The liver parenchyma is diffusely echogenic and the
echotexture is mildly coarsened in keeping with changes of mild to
moderate hepatic steatosis. No focal intrahepatic masses are seen. 2
simple cysts are identified within the right lobe measuring 1.9 and
2.6 cm in greatest dimension. There is no intrahepatic biliary
ductal dilation. Portal vein is patent on color Doppler imaging with
normal direction of blood flow towards the liver.

IVC: No abnormality visualized.

Pancreas: Visualized portion unremarkable.

Spleen: Size and appearance within normal limits.

Right Kidney: Length: 9.2 cm. Echogenicity within normal limits. No
mass or hydronephrosis visualized.

Left Kidney: Length: 9.9 cm. Echogenicity within normal limits. No
mass or hydronephrosis visualized.

Abdominal aorta: No aneurysm visualized.

Other findings: None.
IMPRESSION: 1. Mild to moderate hepatic steatosis.
2. Two simple hepatic cysts.

## 2021-02-25 LAB — FRUCTOSAMINE: Fructosamine: 260 umol/L (ref 205–285)

## 2021-02-27 ENCOUNTER — Emergency Department (HOSPITAL_COMMUNITY): Payer: Medicare Other

## 2021-02-27 ENCOUNTER — Emergency Department (HOSPITAL_COMMUNITY)
Admission: EM | Admit: 2021-02-27 | Discharge: 2021-02-28 | Disposition: A | Discharge status: Home / Self Care | Payer: Medicare Other | Attending: Emergency Medicine | Admitting: Emergency Medicine

## 2021-02-27 ENCOUNTER — Other Ambulatory Visit: Payer: Self-pay

## 2021-02-27 DIAGNOSIS — I10 Essential (primary) hypertension: Secondary | ICD-10-CM | POA: Diagnosis not present

## 2021-02-27 DIAGNOSIS — J8410 Pulmonary fibrosis, unspecified: Secondary | ICD-10-CM | POA: Diagnosis not present

## 2021-02-27 DIAGNOSIS — I700 Atherosclerosis of aorta: Secondary | ICD-10-CM | POA: Diagnosis not present

## 2021-02-27 DIAGNOSIS — E785 Hyperlipidemia, unspecified: Secondary | ICD-10-CM | POA: Diagnosis not present

## 2021-02-27 DIAGNOSIS — Z7982 Long term (current) use of aspirin: Secondary | ICD-10-CM | POA: Insufficient documentation

## 2021-02-27 DIAGNOSIS — E1169 Type 2 diabetes mellitus with other specified complication: Secondary | ICD-10-CM | POA: Insufficient documentation

## 2021-02-27 DIAGNOSIS — E1140 Type 2 diabetes mellitus with diabetic neuropathy, unspecified: Secondary | ICD-10-CM | POA: Diagnosis not present

## 2021-02-27 DIAGNOSIS — R109 Unspecified abdominal pain: Secondary | ICD-10-CM | POA: Diagnosis not present

## 2021-02-27 DIAGNOSIS — I2510 Atherosclerotic heart disease of native coronary artery without angina pectoris: Secondary | ICD-10-CM | POA: Diagnosis not present

## 2021-02-27 DIAGNOSIS — R0789 Other chest pain: Secondary | ICD-10-CM | POA: Diagnosis not present

## 2021-02-27 DIAGNOSIS — K760 Fatty (change of) liver, not elsewhere classified: Secondary | ICD-10-CM | POA: Diagnosis not present

## 2021-02-27 DIAGNOSIS — R079 Chest pain, unspecified: Secondary | ICD-10-CM | POA: Diagnosis not present

## 2021-02-27 LAB — CBC WITH DIFFERENTIAL/PLATELET
Abs Immature Granulocytes: 0.02 10*3/uL (ref 0.00–0.07)
Basophils Absolute: 0.1 10*3/uL (ref 0.0–0.1)
Basophils Relative: 1 %
Eosinophils Absolute: 0.3 10*3/uL (ref 0.0–0.5)
Eosinophils Relative: 5 %
HCT: 37.9 % (ref 36.0–46.0)
Hemoglobin: 12.4 g/dL (ref 12.0–15.0)
Immature Granulocytes: 0 %
Lymphocytes Relative: 26 %
Lymphs Abs: 1.6 10*3/uL (ref 0.7–4.0)
MCH: 30 pg (ref 26.0–34.0)
MCHC: 32.7 g/dL (ref 30.0–36.0)
MCV: 91.8 fL (ref 80.0–100.0)
Monocytes Absolute: 0.4 10*3/uL (ref 0.1–1.0)
Monocytes Relative: 6 %
Neutro Abs: 3.9 10*3/uL (ref 1.7–7.7)
Neutrophils Relative %: 62 %
Platelets: 229 10*3/uL (ref 150–400)
RBC: 4.13 MIL/uL (ref 3.87–5.11)
RDW: 12.9 % (ref 11.5–15.5)
WBC: 6.3 10*3/uL (ref 4.0–10.5)
nRBC: 0 % (ref 0.0–0.2)

## 2021-02-27 LAB — BASIC METABOLIC PANEL
Anion gap: 11 (ref 5–15)
Anion gap: 9 (ref 5–15)
BUN: 15 mg/dL (ref 8–23)
BUN: 12 mg/dL (ref 8–23)
CO2: 22 mmol/L (ref 22–32)
CO2: 25 mmol/L (ref 22–32)
Calcium: 9.3 mg/dL (ref 8.9–10.3)
Calcium: 9.1 mg/dL (ref 8.9–10.3)
Chloride: 105 mmol/L (ref 98–111)
Chloride: 105 mmol/L (ref 98–111)
Creatinine, Ser: 0.84 mg/dL (ref 0.44–1.00)
Creatinine, Ser: 0.84 mg/dL (ref 0.44–1.00)
GFR, Estimated: >60 mL/min (ref >60)
GFR, Estimated: >60 mL/min (ref >60)
Glucose, Bld: 120 mg/dL — ABNORMAL HIGH (ref 70–99)
Glucose, Bld: 192 mg/dL — ABNORMAL HIGH (ref 70–99)
Potassium: 5.8 mmol/L — ABNORMAL HIGH (ref 3.5–5.1)
Potassium: 3.5 mmol/L (ref 3.5–5.1)
Sodium: 138 mmol/L (ref 135–145)
Sodium: 139 mmol/L (ref 135–145)

## 2021-02-27 LAB — TROPONIN I (HIGH SENSITIVITY)
Troponin I (High Sensitivity): 3 ng/L (ref <18)
Troponin I (High Sensitivity): 4 ng/L (ref <18)

## 2021-02-27 MED ORDER — ACETAMINOPHEN 325 MG PO TABS
650.0000 mg | ORAL_TABLET | Freq: Once | ORAL | Status: AC
  Administered 2021-02-28: 650 mg via ORAL
  Filled 2021-02-27: qty 2

## 2021-02-27 MED ORDER — LOSARTAN POTASSIUM 50 MG PO TABS
25.0000 mg | ORAL_TABLET | Freq: Once | ORAL | Status: AC
  Administered 2021-02-28: 25 mg via ORAL
  Filled 2021-02-27: qty 1

## 2021-02-27 NOTE — ED Provider Notes (Signed)
Emergency Medicine Provider Triage Evaluation Note  Julie Schmidt , a 80 y.o. female  was evaluated in triage.  Pt complains of CP x 3 days. Constant. Mild SOB. No diaphoresis, N,V, abd pain. No LE edema. No cough, UR complaints  Review of Systems  Positive: CP, SOB Negative: Fever, cough, emesis, abd pain  Physical Exam  There were no vitals taken for this visit. Gen:   Awake, no distress   Resp:  Normal effort  MSK:   Moves extremities without difficulty  Other:  LE compartments soft  Medical Decision Making  Medically screening exam initiated at 2:36 PM.  Appropriate orders placed.  ARTA STUMP was informed that the remainder of the evaluation will be completed by another provider, this initial triage assessment does not replace that evaluation, and the importance of remaining in the ED until their evaluation is complete.  CP     Michiah Mudry A, PA-C 02/27/21 1437    Truddie Hidden, MD 02/27/21 [phone]

## 2021-02-27 NOTE — ED Triage Notes (Signed)
Pt with central chest pain x 3 days, worse with exertion. Denies any shob, n/v, or any cardiac hx. Pt did not take her Losartan today because she is wondering if the medication is causing the chest pain.

## 2021-02-27 NOTE — ED Provider Notes (Signed)
[facility] EMERGENCY DEPARTMENT Provider Note   CSN: [id number] Arrival date & time: 02/27/21  1426     History Chief Complaint  Patient presents with   Chest Pain    Julie Schmidt is a 80 y.o. female.  The history is provided by the patient and medical records.  Chest Pain Julie Schmidt is a 80 y.o. female who presents to the Emergency Department complaining of chest pain.  She presents to the ED complaining of three days of constant central chest pain.  Pain is described as heavy and hurting, radiates to the back.  Pain is worse with movement.  No fever, cough, abdominal pain, N/V, leg swelling/pain.  Has some sob.  Has mild HA.  No recent accidents or injuries.  No prior similar sxs.  She is concerned that her blood pressure medication is causing her chest pain.  Did not take her losartan today.    No hx/o DVT/PE.  Takes 39m ASA.  No hx/o CAD.      Past Medical History:  Diagnosis Date   Arthritis    knees, back   Diabetes mellitus    Headache    Hypertension     Patient Active Problem List   Diagnosis Date Noted   PAD (peripheral artery disease) ([place] 01/15/2021   Polyneuropathy associated with underlying disease ([place] 01/15/2021   GERD (gastroesophageal reflux disease) 12/07/2019   Generalized osteoarthritis of multiple sites 04/03/2018   Hyperlipidemia associated with type 2 diabetes mellitus ([place] 01/14/2018   Primary hypertension 05/07/2016   Abnormal nuclear stress test 11/25/2014   Abnormal EKG 11/02/2014   Prolonged Q-T interval on ECG 11/02/2014   Diet-controlled diabetes mellitus ([place] 033/00/7622  Helicobacter pylori gastritis 09/18/2014   Type 2 diabetes mellitus with neurological complications ([place] 163/33/5456  Meningioma ([place] 05/19/2014   Back pain, chronic 10/25/2011    Past Surgical History:  Procedure Laterality Date   ABDOMINAL HYSTERECTOMY     APPENDECTOMY     BRAIN SURGERY  05/19/2014   Craniotomy for meningioma    CARDIAC CATHETERIZATION N/A 11/25/2014   Procedure: Left Heart Cath and Coronary Angiography;  Surgeon: MJerline Pain MD;  [facility];  Service: Cardiovascular;  Laterality: N/A;   CESAREAN SECTION     CRANIOTOMY Left 05/19/2014   Procedure: CRANIOTOMY TUMOR EXCISION with Curve;  Surgeon: JErline Levine MD;  [facility];  Service: Neurosurgery;  Laterality: Left;  Left frontal craniotomy for meningioma with brain lab     OB History   No obstetric history on file.     Family History  Problem Relation Age of Onset   Cancer Father     Social History   Tobacco Use   Smoking status: Never   Smokeless tobacco: Never  Substance Use Topics   Alcohol use: No    Alcohol/week: 0.0 standard drinks   Drug use: No    Home Medications Prior to Admission medications   Medication Sig Start Date End Date Taking? Authorizing Provider  Accu-Chek Softclix Lancets lancets Use to check blood sugar daily. Dx:e11.49 09/13/20   JMartinique [name], MD  aspirin 81 MG tablet Take 81 mg by mouth daily.    [provider]  Blood Glucose Monitoring Suppl (ACCU-CHEK AVIVA CONNECT) w/Device KIT 1 Device by Does not apply route daily. 03/08/20   JMartinique [name], MD  Blood Glucose Monitoring Suppl (ACCU-CHEK GUIDE ME) w/Device KIT Use to check blood sugars daily. 09/13/20  [name], [name], MD  Cholecalciferol (VITAMIN D3) 2000 units TABS Take by mouth daily.    [provider]  Continuous Blood Gluc Receiver (DEXCOM G6 RECEIVER) DEVI Use to check blood sugars up to 3 times daily. 12/10/19   [name], [name], MD  dorzolamide-timolol (COSOPT) 22.3-6.8 MG/ML ophthalmic solution Place 1 drop into both eyes 2 (two) times daily. 11/29/20   [provider]  DULoxetine (CYMBALTA) 30 MG capsule Take 1 capsule (30 mg total) by mouth daily. 02/05/21   [name], [name], MD  glucose blood (ACCU-CHEK GUIDE) test strip Use to check blood sugars daily. Dx:e11.49 09/13/20   [name], Betty  G, MD  ibuprofen (ADVIL,MOTRIN) 200 MG tablet Take 200 mg by mouth every 6 (six) hours as needed.    [provider]  Lancets Misc. (ACCU-CHEK SOFTCLIX LANCET DEV) KIT 1 Device by Does not apply route daily. 02/16/20   [name], [name], MD  latanoprost (XALATAN) 0.005 % ophthalmic solution 1 drop at bedtime. 02/18/20   [provider]  losartan (COZAAR) 50 MG tablet Take 1 tablet (50 mg total) by mouth daily. 02/28/21   Quintella Reichert, MD  pantoprazole (PROTONIX) 40 MG tablet Take 1 tablet (40 mg total) by mouth daily. 09/13/20   [name], [name], MD  polyethylene glycol powder (GLYCOLAX/MIRALAX) powder MIX 255 GRAM PO ONCE FOR 1 DOSE 12/18/17   [provider]  pravastatin (PRAVACHOL) 20 MG tablet Take 1 tablet (20 mg total) by mouth daily. 09/13/20   [name], [name], MD    Allergies    Norvasc [amlodipine] and Oxycodone-acetaminophen  Review of Systems   Review of Systems  Cardiovascular:  Positive for chest pain.  All other systems reviewed and are negative.  Physical Exam Updated Vital Signs BP (!) 184/71   Pulse (!) 58   Temp 98.6 F (37 C)   Resp 19   SpO2 98%   Physical Exam Vitals and nursing note reviewed.  Constitutional:      Appearance: She is well-developed.  HENT:     Head: Normocephalic and atraumatic.  Cardiovascular:     Rate and Rhythm: Normal rate and regular rhythm.     Heart sounds: No murmur heard. Pulmonary:     Effort: Pulmonary effort is normal. No respiratory distress.     Breath sounds: Normal breath sounds.  Chest:     Chest wall: No tenderness.  Abdominal:     Palpations: Abdomen is soft.     Tenderness: There is no abdominal tenderness. There is no guarding or rebound.  Musculoskeletal:        General: No swelling or tenderness.     Comments: 2+ radial and DP pulses bilaterally  Skin:    General: Skin is warm and dry.  Neurological:     Mental Status: She is alert and oriented to person, place, and time.  Psychiatric:         Behavior: Behavior normal.    ED Results / Procedures / Treatments   Labs (all labs ordered are listed, but only abnormal results are displayed) Labs Reviewed  BASIC METABOLIC PANEL - Abnormal; Notable for the following components:      Result Value   Potassium 5.8 (*)    Glucose, Bld 120 (*)    All other components within normal limits  BASIC METABOLIC PANEL - Abnormal; Notable for the following components:   Glucose, Bld 192 (*)    All other components within normal limits  CBC WITH DIFFERENTIAL/PLATELET  TROPONIN  I (HIGH SENSITIVITY)  TROPONIN I (HIGH SENSITIVITY)    EKG EKG Interpretation  Date/Time:  Tuesday February 27 2021 14:36:30 EDT Ventricular Rate:  80 PR Interval:  160 QRS Duration: 86 QT Interval:  378 QTC Calculation: 435 R Axis:   -59 Text Interpretation: Sinus rhythm with frequent Premature ventricular complexes Left axis deviation Minimal voltage criteria for LVH, may be normal variant ( R in aVL ) Anterolateral infarct , age undetermined Abnormal ECG Confirmed by Quintella Reichert [id number]) on 02/27/2021 11:35:55 PM  Radiology DG Chest 2 View  Result Date: 02/27/2021 CLINICAL DATA:  Chest pain EXAM: CHEST - 2 VIEW COMPARISON:  Chest radiograph 01/05/2021 FINDINGS: The cardiomediastinal silhouette is normal. There is no focal consolidation or pulmonary edema. There is no pleural effusion or pneumothorax. There is no acute osseous abnormality. IMPRESSION: No radiographic evidence of acute cardiopulmonary process. Electronically Signed   By: Valetta Mole M.D.   On: 02/27/2021 15:32   CT Angio Chest/Abd/Pel for Dissection W and/or W/WO  Result Date: 02/28/2021 CLINICAL DATA:  Abdominal pain.  Aortic dissection is suspected. EXAM: CT ANGIOGRAPHY CHEST, ABDOMEN AND PELVIS TECHNIQUE: Non-contrast CT of the chest was initially obtained. Multidetector CT imaging through the chest, abdomen and pelvis was performed using the standard protocol during bolus  administration of intravenous contrast. Multiplanar reconstructed images and MIPs were obtained and reviewed to evaluate the vascular anatomy. CONTRAST:  161m OMNIPAQUE IOHEXOL 350 MG/ML SOLN COMPARISON:  CT chest 06/22/2016.  CT abdomen and pelvis 05/30/2005 FINDINGS: CTA CHEST FINDINGS Cardiovascular: Noncontrast images of the chest demonstrate scattered calcifications in the aorta and coronary arteries. No evidence of intramural hematoma. Surgical clips at the EG junction. Images obtained during the arterial phase after intravenous contrast administration demonstrate normal caliber thoracic aorta. Mild intimal thickening or mural thrombus in the descending aorta. No aortic aneurysm or dissection. Great vessel origins are patent. Central pulmonary arteries are patent without evidence of significant pulmonary embolus. Normal heart size. No pericardial effusions. Mediastinum/Nodes: Esophagus is decompressed. No significant lymphadenopathy in the chest. Thyroid gland is unremarkable. Lungs/Pleura: Slight fibrosis in the lung bases. Lungs are otherwise clear. No airspace disease or consolidation. No pleural effusions. No pneumothorax. Musculoskeletal: Degenerative changes in the spine. No destructive bone lesions. Review of the MIP images confirms the above findings. CTA ABDOMEN AND PELVIS FINDINGS VASCULAR Aorta: Scattered aortic calcification.  No aneurysm or dissection. Celiac: Patent without evidence of aneurysm, dissection, vasculitis or significant stenosis. SMA: Patent without evidence of aneurysm, dissection, vasculitis or significant stenosis. Renals: Duplicated renal arteries bilaterally are patent. No aneurysm or dissection. Nephrograms are symmetrical. IMA: Patent without evidence of aneurysm, dissection, vasculitis or significant stenosis. Inflow: Patent without evidence of aneurysm, dissection, vasculitis or significant stenosis. Veins: No obvious venous abnormality within the limitations of this  arterial phase study. Review of the MIP images confirms the above findings. NON-VASCULAR Hepatobiliary: Mild diffuse fatty infiltration of the liver. No focal lesions. Gallbladder and bile ducts are unremarkable. Pancreas: Unremarkable. No pancreatic ductal dilatation or surrounding inflammatory changes. Spleen: Normal in size without focal abnormality. Adrenals/Urinary Tract: Adrenal glands are unremarkable. Kidneys are normal, without renal calculi, focal lesion, or hydronephrosis. Bladder is unremarkable. Stomach/Bowel: Stomach, small bowel, and colon are not abnormally distended. Scattered stool throughout the colon. No wall thickening or inflammatory changes. Appendix is not identified. Lymphatic: No significant lymphadenopathy. Reproductive: Uterus and bilateral adnexa are unremarkable. Other: No free air or free fluid in the abdomen. Abdominal wall musculature appears intact. Musculoskeletal: Degenerative changes in the spine.  No destructive bone lesions. Review of the MIP images confirms the above findings. IMPRESSION: 1. Mild aortic atherosclerosis. No evidence of thoracic or abdominal aortic aneurysm or dissection. 2. No evidence of active pulmonary disease. 3. Fatty infiltration of the liver. 4. No evidence of bowel obstruction or inflammation. Electronically Signed   By: Lucienne Capers M.D.   On: 02/28/2021 01:08    Procedures Procedures   Medications Ordered in ED Medications  acetaminophen (TYLENOL) tablet 650 mg (650 mg Oral Given 02/28/21 0001)  losartan (COZAAR) tablet 25 mg (25 mg Oral Given 02/28/21 0001)  iohexol (OMNIPAQUE) 350 MG/ML injection 100 mL (100 mLs Intravenous Contrast Given 02/28/21 0054)    ED Course  I have reviewed the triage vital signs and the nursing notes.  Pertinent labs & imaging results that were available during my care of the patient were reviewed by me and considered in my medical decision making (see chart for details).    MDM Rules/Calculators/A&P                           patient with history of diabetes, hypertension, hyperlipidemia here for evaluation of chest pain that is constant but worse with movement (twisting, reaching). EKG with PVCs, no acute ischemic changes. She did not get her blood pressure medications today. She is concerned that the blood pressure medication may be contributing to her pain. She has been losartan for two months. Troponins are negative times two. Given radiation of her pain to her back a CTA was obtained, which is negative for PE and dissection. Current presentation is not consistent with hypertensive urgency. Discussed with patient that it is unlikely that the blood pressure medications are causing her chest pain as she has been on these medications for the last two months. Recommend continuing her medications as directed, will prescribe refill. Discussed outpatient PCP and cardiology follow-up as well as return precautions.  Final Clinical Impression(s) / ED Diagnoses Final diagnoses:  Atypical chest pain  Primary hypertension    Rx / DC Orders ED Discharge Orders          Ordered    Ambulatory referral to Cardiology        02/28/21 0135    losartan (COZAAR) 50 MG tablet  Daily       Note to Pharmacy: Dose changed.   02/28/21 0139             Quintella Reichert, MD 02/28/21 [phone]

## 2021-02-27 NOTE — ED Notes (Signed)
I introduced myself to pt. Pt here with central CP x3 days. Sometimes causes her to feel short of breath and sometimes radiates to back. Pt says pain is a 7/10. Nothing makes it worse or better. Pt didn't take losartan today because is unsure if it's causing her chest pain. Pt AxO x4 with a GCS of 15.

## 2021-02-28 ENCOUNTER — Emergency Department (HOSPITAL_COMMUNITY): Payer: Medicare Other

## 2021-02-28 DIAGNOSIS — J8410 Pulmonary fibrosis, unspecified: Secondary | ICD-10-CM | POA: Diagnosis not present

## 2021-02-28 DIAGNOSIS — K760 Fatty (change of) liver, not elsewhere classified: Secondary | ICD-10-CM | POA: Diagnosis not present

## 2021-02-28 DIAGNOSIS — R109 Unspecified abdominal pain: Secondary | ICD-10-CM | POA: Diagnosis not present

## 2021-02-28 DIAGNOSIS — I2510 Atherosclerotic heart disease of native coronary artery without angina pectoris: Secondary | ICD-10-CM | POA: Diagnosis not present

## 2021-02-28 DIAGNOSIS — I700 Atherosclerosis of aorta: Secondary | ICD-10-CM | POA: Diagnosis not present

## 2021-02-28 DIAGNOSIS — I10 Essential (primary) hypertension: Secondary | ICD-10-CM | POA: Diagnosis not present

## 2021-02-28 MED ORDER — IOHEXOL 350 MG/ML SOLN
100.0000 mL | Freq: Once | INTRAVENOUS | Status: AC | PRN
  Administered 2021-02-28: 100 mL via INTRAVENOUS

## 2021-02-28 MED ORDER — LOSARTAN POTASSIUM 50 MG PO TABS: 50.0000 mg | ORAL_TABLET | Freq: Every day | ORAL | 0 refills | Status: DC

## 2021-02-28 NOTE — ED Notes (Signed)
Pt transported to CT ?

## 2021-02-28 NOTE — ED Notes (Signed)
Patient transported to CT 

## 2021-02-28 NOTE — ED Notes (Signed)
Pt's daughter brought food for pt.Ok'd by Dr. Ralene Bathe.

## 2021-02-28 NOTE — ED Notes (Signed)
Pt d/c'd via wheelchair. Steady on feet. GCS 15. D/c'd with sister.

## 2021-02-28 NOTE — ED Notes (Signed)
Pt back from CT and ambulatory to restroom. Steady on feet.

## 2021-03-07 ENCOUNTER — Telehealth: Payer: Self-pay | Admitting: Pharmacist

## 2021-03-07 NOTE — Chronic Care Management (AMB) (Signed)
    Chronic Care Management Pharmacy Assistant   Name: Julie Schmidt  MRN: [id number] DOB: 06-07-1941  Spoke with Julie Schmidt patients daughter who stated that they will be leaving the country soon for an undisclosed amount of time. Julie Schmidt stated she would call me upon arrival back to reschedule appointments.  Medications: Outpatient Encounter Medications as of 03/07/2021  Medication Sig   Accu-Chek Softclix Lancets lancets Use to check blood sugar daily. Dx:e11.49   aspirin 81 MG tablet Take 81 mg by mouth daily.   Blood Glucose Monitoring Suppl (ACCU-CHEK AVIVA CONNECT) w/Device KIT 1 Device by Does not apply route daily.   Blood Glucose Monitoring Suppl (ACCU-CHEK GUIDE ME) w/Device KIT Use to check blood sugars daily.   Cholecalciferol (VITAMIN D3) 2000 units TABS Take by mouth daily.   Continuous Blood Gluc Receiver (DEXCOM G6 RECEIVER) DEVI Use to check blood sugars up to 3 times daily.   dorzolamide-timolol (COSOPT) 22.3-6.8 MG/ML ophthalmic solution Place 1 drop into both eyes 2 (two) times daily.   DULoxetine (CYMBALTA) 30 MG capsule Take 1 capsule (30 mg total) by mouth daily.   glucose blood (ACCU-CHEK GUIDE) test strip Use to check blood sugars daily. Dx:e11.49   ibuprofen (ADVIL,MOTRIN) 200 MG tablet Take 200 mg by mouth every 6 (six) hours as needed.   Lancets Misc. (ACCU-CHEK SOFTCLIX LANCET DEV) KIT 1 Device by Does not apply route daily.   latanoprost (XALATAN) 0.005 % ophthalmic solution 1 drop at bedtime.   losartan (COZAAR) 50 MG tablet Take 1 tablet (50 mg total) by mouth daily.   pantoprazole (PROTONIX) 40 MG tablet Take 1 tablet (40 mg total) by mouth daily.   polyethylene glycol powder (GLYCOLAX/MIRALAX) powder MIX 255 GRAM PO ONCE FOR 1 DOSE   pravastatin (PRAVACHOL) 20 MG tablet Take 1 tablet (20 mg total) by mouth daily.   No facility-administered encounter medications on file as of 03/07/2021.    Care Gaps:  AWV - completed on 01/08/21 Zoster vaccines - never  done Ophthalmology exam - overdue since 06/01/20 Covid-19 vaccine booster 4 - overdue since 07/03/20  Star Rating Drugs:  Losartan 50mg  - last filled on 03/06/21 30DS at [facility] Pravastatin 20mg  - last filled on 02/06/21 90DS at [facility] [phone]

## 2021-03-09 ENCOUNTER — Other Ambulatory Visit: Payer: Self-pay

## 2021-03-09 ENCOUNTER — Encounter: Payer: Self-pay | Admitting: Internal Medicine

## 2021-03-09 ENCOUNTER — Ambulatory Visit (INDEPENDENT_AMBULATORY_CARE_PROVIDER_SITE_OTHER): Payer: Medicare Other | Admitting: Internal Medicine

## 2021-03-09 VITALS — BP 170/80 | HR 61 | Temp 98.3°F | Wt 134.8 lb

## 2021-03-09 DIAGNOSIS — I808 Phlebitis and thrombophlebitis of other sites: Secondary | ICD-10-CM | POA: Diagnosis not present

## 2021-03-09 NOTE — Progress Notes (Signed)
Acute office Visit     This visit occurred during the SARS-CoV-2 public health emergency.  Safety protocols were in place, including screening questions prior to the visit, additional usage of staff PPE, and extensive cleaning of exam room while observing appropriate contact time as indicated for disinfecting solutions.    CC/Reason for Visit: Left arm pain  HPI: Julie Schmidt is a 80 y.o. female who is coming in today for the above mentioned reasons.  She is here today for evaluation of pain of her left arm that began 2 days ago.  She locates the pain to the inner aspect of her elbow right at the site of a blood draw last week.  She has a visible bruise in that area with apparent thrombophlebitis.  Past Medical/Surgical History: Past Medical History:  Diagnosis Date   Arthritis    knees, back   Diabetes mellitus    Headache    Hypertension     Past Surgical History:  Procedure Laterality Date   ABDOMINAL HYSTERECTOMY     APPENDECTOMY     BRAIN SURGERY  05/19/2014   Craniotomy for meningioma   CARDIAC CATHETERIZATION N/A 11/25/2014   Procedure: Left Heart Cath and Coronary Angiography;  Surgeon: Jerline Pain, MD;  Location: [facility];  Service: Cardiovascular;  Laterality: N/A;   CESAREAN SECTION     CRANIOTOMY Left 05/19/2014   Procedure: CRANIOTOMY TUMOR EXCISION with Curve;  Surgeon: Erline Levine, MD;  Location: [facility];  Service: Neurosurgery;  Laterality: Left;  Left frontal craniotomy for meningioma with brain lab    Social History:  reports that she has never smoked. She has never used smokeless tobacco. She reports that she does not drink alcohol and does not use drugs.  Allergies: Allergies  Allergen Reactions   Norvasc [Amlodipine] Other (See Comments)    Dizziness and Chest pains at night when lies down   Oxycodone-Acetaminophen Nausea And Vomiting    Family History:  Family History  Problem Relation Age of Onset   Cancer Father       Current Outpatient Medications:    Accu-Chek Softclix Lancets lancets, Use to check blood sugar daily. Dx:e11.49, Disp: 100 each, Rfl: 12   aspirin 81 MG tablet, Take 81 mg by mouth daily., Disp: , Rfl:    Blood Glucose Monitoring Suppl (ACCU-CHEK AVIVA CONNECT) w/Device KIT, 1 Device by Does not apply route daily., Disp: 1 kit, Rfl: 0   Blood Glucose Monitoring Suppl (ACCU-CHEK GUIDE ME) w/Device KIT, Use to check blood sugars daily., Disp: 1 kit, Rfl: 0   Cholecalciferol (VITAMIN D3) 2000 units TABS, Take by mouth daily., Disp: , Rfl:    Continuous Blood Gluc Receiver ([place]) DEVI, Use to check blood sugars up to 3 times daily., Disp: 1 each, Rfl: 1   dorzolamide-timolol (COSOPT) 22.3-6.8 MG/ML ophthalmic solution, Place 1 drop into both eyes 2 (two) times daily., Disp: , Rfl:    DULoxetine (CYMBALTA) 30 MG capsule, Take 1 capsule (30 mg total) by mouth daily., Disp: 90 capsule, Rfl: 1   glucose blood (ACCU-CHEK GUIDE) test strip, Use to check blood sugars daily. Dx:e11.49, Disp: 100 each, Rfl: 12   ibuprofen (ADVIL,MOTRIN) 200 MG tablet, Take 200 mg by mouth every 6 (six) hours as needed., Disp: , Rfl:    Lancets Misc. (ACCU-CHEK SOFTCLIX LANCET DEV) KIT, 1 Device by Does not apply route daily., Disp: 100 kit, Rfl: 3   latanoprost (XALATAN) 0.005 % ophthalmic solution,  1 drop at bedtime., Disp: , Rfl:    losartan (COZAAR) 50 MG tablet, Take 1 tablet (50 mg total) by mouth daily., Disp: 30 tablet, Rfl: 0   pantoprazole (PROTONIX) 40 MG tablet, Take 1 tablet (40 mg total) by mouth daily., Disp: 90 tablet, Rfl: 2   polyethylene glycol powder (GLYCOLAX/MIRALAX) powder, MIX 255 GRAM PO ONCE FOR 1 DOSE, Disp: , Rfl: 0   pravastatin (PRAVACHOL) 20 MG tablet, Take 1 tablet (20 mg total) by mouth daily., Disp: 90 tablet, Rfl: 2  Review of Systems:  Constitutional: Denies fever, chills, diaphoresis, appetite change and fatigue.  HEENT: Denies photophobia, eye pain, redness, hearing  loss, ear pain, congestion, sore throat, rhinorrhea, sneezing, mouth sores, trouble swallowing, neck pain, neck stiffness and tinnitus.   Respiratory: Denies SOB, DOE, cough, chest tightness,  and wheezing.   Cardiovascular: Denies chest pain, palpitations and leg swelling.  Gastrointestinal: Denies nausea, vomiting, abdominal pain, diarrhea, constipation, blood in stool and abdominal distention.  Genitourinary: Denies dysuria, urgency, frequency, hematuria, flank pain and difficulty urinating.  Endocrine: Denies: hot or cold intolerance, sweats, changes in hair or nails, polyuria, polydipsia. Musculoskeletal: Denies myalgias, back pain, joint swelling, arthralgias and gait problem.  Skin: Denies pallor, rash and wound.  Neurological: Denies dizziness, seizures, syncope, weakness, light-headedness, numbness and headaches.  Hematological: Denies adenopathy. Easy bruising, personal or family bleeding history  Psychiatric/Behavioral: Denies suicidal ideation, mood changes, confusion, nervousness, sleep disturbance and agitation    Physical Exam: Vitals:   03/09/21 0940  BP: (!) 170/80  Pulse: 61  Temp: 98.3 F (36.8 C)  TempSrc: Oral  SpO2: 96%  Weight: 134 lb 12.8 oz (61.1 kg)    Body mass index is 23.88 kg/m.   Constitutional: NAD, calm, comfortable Eyes: PERRL, lids and conjunctivae normal ENMT: Mucous membranes are moist.  Skin: Purplish bruise around her left inner elbow extending about an inch upwards Neurologic: Grossly intact and nonfocal Psychiatric: Normal judgment and insight. Alert and oriented x 3. Normal mood.    Impression and Plan:  Superficial thrombophlebitis of left upper extremity -For now have advised conservative management with icing and as needed anti-inflammatories.  Time spent: 20 minutes reviewing chart, interviewing patient and caregiver, examining patient and formulating plan of care.     Lelon Frohlich, MD [facility]  [facility]

## 2021-03-16 ENCOUNTER — Telehealth: Payer: Medicare Other

## 2021-03-27 ENCOUNTER — Telehealth: Payer: Self-pay | Admitting: Family Medicine

## 2021-03-27 MED ORDER — TYPHOID VACCINE PO CPDR: 1.0000 | DELAYED_RELEASE_CAPSULE | ORAL | 0 refills | Status: DC

## 2021-03-27 NOTE — Telephone Encounter (Signed)
Rx sent in as requested. 

## 2021-03-27 NOTE — Telephone Encounter (Signed)
Patient is traveling and needs the Typhoid vaccine capsule called in to the pharmacy.  Pharmacy- [facility]

## 2021-03-30 ENCOUNTER — Telehealth: Payer: Self-pay | Admitting: Pharmacist

## 2021-03-30 NOTE — Chronic Care Management (AMB) (Addendum)
Chronic Care Management Pharmacy Assistant   Name: Julie Schmidt  MRN: [id number] DOB: 30-Oct-1940   Reason for Encounter: Medication Review Medication Coordination   Recent office visits:  03/09/21 Isaac Bliss, Rayford Halsted, MD - Patient presented for Superficial thrombophlebitis of left upper extremity. No medication changes.  02/21/21 [name], [name], MD - Patient presented for Type 2 diabetes mellitus with neurological complications and other concerns. Increased Losartan to 50 mg.  Recent consult visits:  None  Hospital visits:  Medication Reconciliation was completed by comparing discharge summary, patient's EMR and Pharmacy list, and upon discussion with patient.  Patient presented to [facility] ED on 02/27/21 due to Atypical chest pain and other concerns. Patient was there for 11 hours.  New?Medications Started at [facility] Discharge:?? -started None   Medication Changes at Hospital Discharge: -Changed None  Medications Discontinued at Hospital Discharge: -Stopped None  Medications that remain the same after Hospital Discharge:??  -All other medications will remain the same.    Medications: Outpatient Encounter Medications as of 03/30/2021  Medication Sig   Accu-Chek Softclix Lancets lancets Use to check blood sugar daily. Dx:e11.49   aspirin 81 MG tablet Take 81 mg by mouth daily.   Blood Glucose Monitoring Suppl (ACCU-CHEK AVIVA CONNECT) w/Device KIT 1 Device by Does not apply route daily.   Blood Glucose Monitoring Suppl (ACCU-CHEK GUIDE ME) w/Device KIT Use to check blood sugars daily.   Cholecalciferol (VITAMIN D3) 2000 units TABS Take by mouth daily.   Continuous Blood Gluc Receiver (DEXCOM G6 RECEIVER) DEVI Use to check blood sugars up to 3 times daily.   dorzolamide-timolol (COSOPT) 22.3-6.8 MG/ML ophthalmic solution Place 1 drop into both eyes 2 (two) times daily.   DULoxetine (CYMBALTA) 30 MG capsule Take 1 capsule (30 mg total) by mouth  daily.   glucose blood (ACCU-CHEK GUIDE) test strip Use to check blood sugars daily. Dx:e11.49   ibuprofen (ADVIL,MOTRIN) 200 MG tablet Take 200 mg by mouth every 6 (six) hours as needed.   Lancets Misc. (ACCU-CHEK SOFTCLIX LANCET DEV) KIT 1 Device by Does not apply route daily.   latanoprost (XALATAN) 0.005 % ophthalmic solution 1 drop at bedtime.   losartan (COZAAR) 50 MG tablet Take 1 tablet (50 mg total) by mouth daily.   pantoprazole (PROTONIX) 40 MG tablet Take 1 tablet (40 mg total) by mouth daily.   polyethylene glycol powder (GLYCOLAX/MIRALAX) powder MIX 255 GRAM PO ONCE FOR 1 DOSE   pravastatin (PRAVACHOL) 20 MG tablet Take 1 tablet (20 mg total) by mouth daily.   typhoid (VIVOTIF) DR capsule Take 1 capsule by mouth every other day. For 8 days. Last dose to be completed a week before traveling.   No facility-administered encounter medications on file as of 03/30/2021.  Reviewed chart for medication changes ahead of medication coordination call.  No OVs, Consults, or hospital visits since last care coordination call/Pharmacist visit.  Hospital visit noted above  No medication changes indicated  BP Readings from Last 3 Encounters:  03/09/21 (!) 170/80  02/28/21 (!) 184/71  02/21/21 140/80    Lab Results  Component Value Date   HGBA1C 6.5 02/21/2021     Patient obtains medications through Adherence Packaging   6 months    Last adherence delivery included: (medication name and frequency) Pravastatin (PRAVACHOL) 20 MG  Pantoprazole (PROTONIX) 40 MG  Added to form Losartan (COZAAR) 37.5 MG - take one and a half tab daily; Dose was increased and med was short filled for  17 DS on 01-25-2021  Added Dorzolamide-timolol (COSOPT) 22.3-6.8 MG/ML ophthalmic solution she is almost out   Patient is due for next adherence delivery on: 04/11/21. Called patient and reviewed medications and coordinated delivery. Confirmed VIALS (without safety caps) for 6 months   This delivery to  include: Pravastatin (PRAVACHOL) 20 MG : take one tablet at Breakfast Duloxetine (Cymbalta) 30 MG: take one tablet at Breakfast Pantoprazole (PROTONIX) 40 MG: take one tablet Before Breakfast  Losartan (COZAAR) 37.5 MG : take one tablet at Breakfast Dorzolamide-timolol (COSOPT) 22.3-6.8 MG/ML : one drop in each eye twice daily Metoprolol succinate 25 mg: take one tablet daily  Patient also requested fills for her Latanoprost eye drops added to form  Confirmed delivery date of 04/11/21, advised patient that pharmacy will contact them the morning of delivery.   Care Gaps: Zoster Vaccines - Overdue COVID Booster #4 - Overdue Eye Exam - Overdue Dexa - Overdue AWV - 8/22 CCM - Not Scheduled Pt leaving the country A1C- 6.5 BP- 140/80  Star Rating Drugs: Pravastatin (Pravachol) 20 mg - Last filled 02/06/21 90 DS at [facility] Losartan (Cozaar) 20 mg - Last filled 02/2721 17 DS at [facility] Pharmacist Assistant [phone]

## 2021-04-03 ENCOUNTER — Encounter: Payer: Self-pay | Admitting: Cardiovascular Disease

## 2021-04-03 ENCOUNTER — Other Ambulatory Visit: Payer: Self-pay

## 2021-04-03 ENCOUNTER — Ambulatory Visit (INDEPENDENT_AMBULATORY_CARE_PROVIDER_SITE_OTHER): Payer: Medicare Other | Admitting: Cardiovascular Disease

## 2021-04-03 DIAGNOSIS — I10 Essential (primary) hypertension: Secondary | ICD-10-CM | POA: Diagnosis not present

## 2021-04-03 DIAGNOSIS — E785 Hyperlipidemia, unspecified: Secondary | ICD-10-CM | POA: Diagnosis not present

## 2021-04-03 DIAGNOSIS — E1169 Type 2 diabetes mellitus with other specified complication: Secondary | ICD-10-CM

## 2021-04-03 DIAGNOSIS — I493 Ventricular premature depolarization: Secondary | ICD-10-CM | POA: Diagnosis not present

## 2021-04-03 DIAGNOSIS — R9431 Abnormal electrocardiogram [ECG] [EKG]: Secondary | ICD-10-CM

## 2021-04-03 DIAGNOSIS — I739 Peripheral vascular disease, unspecified: Secondary | ICD-10-CM

## 2021-04-03 MED ORDER — METOPROLOL SUCCINATE ER 25 MG PO TB24: 25.0000 mg | ORAL_TABLET | Freq: Every day | ORAL | 3 refills | Status: DC

## 2021-04-03 NOTE — Patient Instructions (Addendum)
Medication Instructions:  Start metoprolol 25 mg once a day.  *If you need a refill on your cardiac medications before your next appointment, please call your pharmacy*   Lab Work: Please return for fasting lab work in the morning (cmet, cbc, lipids, tsh) If you have labs (blood work) drawn today and your tests are completely normal, you will receive your results only by: [place] (if you have MyChart) OR A paper copy in the mail If you have any lab test that is abnormal or we need to change your treatment, we will call you to review the results.   Testing/Procedures: Your physician has requested that you have an echocardiogram. Echocardiography is a painless test that uses sound waves to create images of your heart. It provides your doctor with information about the size and shape of your heart and how well your heart's chambers and valves are working. This procedure takes approximately one hour. There are no restrictions for this procedure.   Follow-Up: At [facility], you and your health needs are our priority.  As part of our continuing mission to provide you with exceptional heart care, we have created designated Provider Care Teams.  These Care Teams include your primary Cardiologist (physician) and Advanced Practice Providers (APPs -  Physician Assistants and Nurse Practitioners) who all work together to provide you with the care you need, when you need it.  We recommend signing up for the patient portal called "MyChart".  Sign up information is provided on this After Visit Summary.  MyChart is used to connect with patients for Virtual Visits (Telemedicine).  Patients are able to view lab/test results, encounter notes, upcoming appointments, etc.  Non-urgent messages can be sent to your provider as well.   To learn more about what you can do with MyChart, go to [email or web address].    Your next appointment:   April 12, 2021 at 9 am  The format for your next  appointment:   In Person  Provider:   Shelva Majestic, MD

## 2021-04-03 NOTE — Progress Notes (Signed)
Cardiology Office Note    Date:  04/05/2021   ID:  Julie, Schmidt 05-25-1941, MRN [id number]  PCP:  [name], [name], MD  Cardiologist:  Shelva Majestic, MD   New cardiology evaluation, referred to the courtesy of Dr. Marin Roberts from [facility] emergency room for evaluation of atypical chest pain.   History of Present Illness:  Julie Schmidt is a 80 y.o. female who is originally from [place], [place].  She has a history of hypertension, hyperlipidemia, and was recently evaluated in the [facility], ER on February 27, 2021 for chest pain.  At that time, she presented complaining of 3 days of constant central chest discomfort.  The pain was described as heavy and radiating to her back.  It was worse with movement.  She denied any precipitation of her chest discomfort with exertion.  There was no associated fever, cough, abdominal pain, nausea vomiting lower extremity edema.  She denies any recent trauma.  During her evaluation, she underwent CT angio of her chest which showed scattered calcification in the aorta and coronary arteries.  There was no evidence for thoracic or abdominal aortic aneurysm or dissection.  There was no evidence for active pulmonary disease.  There was mild fatty infiltration of her liver.  There is no evidence for bowel obstruction or inflammation.  It was felt that her chest pain was noncardiac.  ECG in the emergency room showed sinus rhythm with occasional to frequent PVCs with several beats of bigeminal rhythm.  Ventricular rate was 80 bpm. The patient will be going to [place] next week for approximately 6 months.  Cardiology evaluation was recommended and she presents to the office today for evaluation.  Presently, she has been on losartan 50 mg daily for hypertension for several months and has been on pravastatin 20 mg daily for hyperlipidemia.  She takes Cymbalta 30 mg daily as well as a baby aspirin.  She denies any exertional angina pectoris.  At times there is  minimal shortness of breath with activity.  She is unaware of palpitations.  She denies presyncope or syncope.  She had undergone a lower extremity Doppler study which had been ordered by Dr. Betty [name] on February 17, 2020 which essentially was normal.   Past Medical History:  Diagnosis Date   Arthritis    knees, back   Diabetes mellitus    Headache    Hypertension     Past Surgical History:  Procedure Laterality Date   ABDOMINAL HYSTERECTOMY     APPENDECTOMY     BRAIN SURGERY  05/19/2014   Craniotomy for meningioma   CARDIAC CATHETERIZATION N/A 11/25/2014   Procedure: Left Heart Cath and Coronary Angiography;  Surgeon: Jerline Pain, MD;  Location: [facility];  Service: Cardiovascular;  Laterality: N/A;   CESAREAN SECTION     CRANIOTOMY Left 05/19/2014   Procedure: CRANIOTOMY TUMOR EXCISION with Curve;  Surgeon: Erline Levine, MD;  Location: [facility];  Service: Neurosurgery;  Laterality: Left;  Left frontal craniotomy for meningioma with brain lab    Current Medications: Outpatient Medications Prior to Visit  Medication Sig Dispense Refill   Accu-Chek Softclix Lancets lancets Use to check blood sugar daily. Dx:e11.49 100 each 12   aspirin 81 MG tablet Take 81 mg by mouth daily.     Blood Glucose Monitoring Suppl (ACCU-CHEK AVIVA CONNECT) w/Device KIT 1 Device by Does not apply route daily. 1 kit 0   Blood Glucose Monitoring Suppl ([place] ME)  w/Device KIT Use to check blood sugars daily. 1 kit 0   Cholecalciferol (VITAMIN D3) 2000 units TABS Take by mouth daily.     Continuous Blood Gluc Receiver (DEXCOM G6 RECEIVER) DEVI Use to check blood sugars up to 3 times daily. 1 each 1   dorzolamide-timolol (COSOPT) 22.3-6.8 MG/ML ophthalmic solution Place 1 drop into both eyes 2 (two) times daily.     glucose blood (ACCU-CHEK GUIDE) test strip Use to check blood sugars daily. Dx:e11.49 100 each 12   ibuprofen (ADVIL,MOTRIN) 200 MG tablet Take 200 mg by mouth every  6 (six) hours as needed.     Lancets Misc. (ACCU-CHEK SOFTCLIX LANCET DEV) KIT 1 Device by Does not apply route daily. 100 kit 3   latanoprost (XALATAN) 0.005 % ophthalmic solution 1 drop at bedtime.     polyethylene glycol powder (GLYCOLAX/MIRALAX) powder MIX 255 GRAM PO ONCE FOR 1 DOSE  0   typhoid (VIVOTIF) DR capsule Take 1 capsule by mouth every other day. For 8 days. Last dose to be completed a week before traveling. 4 capsule 0   DULoxetine (CYMBALTA) 30 MG capsule Take 1 capsule (30 mg total) by mouth daily. 90 capsule 1   losartan (COZAAR) 50 MG tablet Take 1 tablet (50 mg total) by mouth daily. 30 tablet 0   pantoprazole (PROTONIX) 40 MG tablet Take 1 tablet (40 mg total) by mouth daily. 90 tablet 2   pravastatin (PRAVACHOL) 20 MG tablet Take 1 tablet (20 mg total) by mouth daily. 90 tablet 2   No facility-administered medications prior to visit.     Allergies:   Norvasc [amlodipine] and Oxycodone-acetaminophen   Social History   Socioeconomic History   Marital status: Widowed    Spouse name: Not on file   Number of children: Not on file   Years of education: Not on file   Highest education level: Not on file  Occupational History   Not on file  Tobacco Use   Smoking status: Never   Smokeless tobacco: Never  Substance and Sexual Activity   Alcohol use: No    Alcohol/week: 0.0 standard drinks   Drug use: No   Sexual activity: Not on file  Other Topics Concern   Not on file  Social History Narrative   Not on file   Social Determinants of Health   Financial Resource Strain: Low Risk    Difficulty of Paying Living Expenses: Not hard at all  Food Insecurity: No Food Insecurity   Worried About [place] in the Last Year: Never true   [place] in the Last Year: Never true  Transportation Needs: No Transportation Needs   Lack of Transportation (Medical): No   Lack of Transportation (Non-Medical): No  Physical Activity: Insufficiently Active   Days  of Exercise per Week: 5 days   Minutes of Exercise per Session: 10 min  Stress: No Stress Concern Present   Feeling of Stress : Not at all  Social Connections: Moderately Isolated   Frequency of Communication with Friends and Family: Twice a week   Frequency of Social Gatherings with Friends and Family: Twice a week   Attends Religious Services: 1 to 4 times per year   Active Member of [employer] or Organizations: No   Attends Archivist Meetings: Never   Marital Status: Never married    Socially she was born in [place], [place].  She moved to [place] in 1999.  Apparently, her mother had 4  children and her father at 61 children from 35 different wives.  She is widowed since 43.  She has 1 daughter.  She completed high school.  There is no history of tobacco use.  She does not drink alcohol.  She does walk at least 3 times per week.  Family History:  The patient's family history includes Cancer in her father.  Her mother died of unknown cause.  Father had cancer and died at 49.  ROS General: Negative; No fevers, chills, or night sweats;  HEENT: Negative; No changes in vision or hearing, sinus congestion, difficulty swallowing Pulmonary: Negative; No cough, wheezing, shortness of breath, hemoptysis Cardiovascular: See HPI GI: Negative; No nausea, vomiting, diarrhea, or abdominal pain GU: Negative; No dysuria, hematuria, or difficulty voiding Musculoskeletal: Negative; no myalgias, joint pain, or weakness Hematologic/Oncology: Negative; no easy bruising, bleeding Endocrine: Negative; no heat/cold intolerance; no diabetes Neuro: Negative; no changes in balance, headaches Skin: Negative; No rashes or skin lesions Psychiatric: Negative; No behavioral problems, depression Sleep: Negative; No snoring, daytime sleepiness, hypersomnolence, bruxism, restless legs, hypnogognic hallucinations, no cataplexy Other comprehensive 14 point system review is negative.   PHYSICAL EXAM:    VS:  BP (!) 160/80 (BP Location: Right Arm)   Pulse 96   Ht _0  (1.549 m)   Wt 135 lb 3.2 oz (61.3 kg)   SpO2 95%   BMI 25.55 kg/m     Repeat blood pressure by me was 162/82.  Wt Readings from Last 3 Encounters:  04/04/21 133 lb 8 oz (60.6 kg)  04/03/21 135 lb 3.2 oz (61.3 kg)  03/09/21 134 lb 12.8 oz (61.1 kg)    General: Alert, oriented, no distress.  Skin: normal turgor, no rashes, warm and dry HEENT: Normocephalic, atraumatic. Pupils equal round and reactive to light; sclera anicteric; extraocular muscles intact;  Nose without nasal septal hypertrophy Mouth/Parynx benign; Mallinpatti scale 3 Neck: No JVD, no carotid bruits; normal carotid upstroke Lungs: clear to ausculatation and percussion; no wheezing or rales Chest wall: No chest wall tenderness over the costochondral region. Heart: PMI not displaced, RRR, s1 s2 normal, 1/6 systolic murmur, no diastolic murmur, no rubs, gallops, thrills, or heaves Abdomen: soft, nontender; no hepatosplenomehaly, BS+; abdominal aorta nontender and not dilated by palpation. Back: no CVA tenderness Pulses 2+ Musculoskeletal: full range of motion, normal strength, no joint deformities Extremities: no clubbing cyanosis or edema, Homan's sign negative  Neurologic: grossly nonfocal; Cranial nerves grossly wnl Psychologic: Normal mood and affect   Studies/Labs Reviewed:   EKG:  EKG is ordered today. ECG (independently read by me):  NSR at 96, LAD, Q waves, inferiorly and anterolaterally, QTc 462 msec  Recent Labs: BMP Latest Ref Rng & Units 04/04/2021 02/27/2021 02/27/2021  Glucose 70 - 99 mg/dL 101(H) 192(H) 120(H)  BUN 8 - 27 mg/dL _1 Creatinine 0.57 - 1.00 mg/dL 0.75 0.84 0.84  BUN/Creat Ratio 12 - 28 13 - -  Sodium 134 - 144 mmol/L 142 139 138  Potassium 3.5 - 5.2 mmol/L 4.5 3.5 5.8(H)  Chloride 96 - 106 mmol/L 103 105 105  CO2 20 - 29 mmol/L _2 Calcium 8.7 - 10.3 mg/dL 9.3 9.1 9.3     Hepatic Function Latest Ref  Rng & Units 04/04/2021 01/05/2021 09/13/2020  Total Protein 6.0 - 8.5 g/dL 7.2 7.3 7.3  Albumin 3.7 - 4.7 g/dL 4.8(H) 3.8 4.3  AST 0 - 40 IU/L _3 ALT 0 - 32 IU/L 17 24 31  Alk Phosphatase 44 - 121 IU/L 131(H) 92 118(H)  Total Bilirubin 0.0 - 1.2 mg/dL 0.5 0.7 0.5  Bilirubin, Direct 0.0 - 0.2 mg/dL - - -    CBC Latest Ref Rng & Units 04/04/2021 02/27/2021 01/05/2021  WBC 3.4 - 10.8 x10E3/uL 5.0 6.3 4.3  Hemoglobin 11.1 - 15.9 g/dL 11.9 12.4 11.3(L)  Hematocrit 34.0 - 46.6 % 35.9 37.9 35.5(L)  Platelets 150 - 450 x10E3/uL 250 229 275   Lab Results  Component Value Date   MCV 89 04/04/2021   MCV 91.8 02/27/2021   MCV 94.2 01/05/2021   Lab Results  Component Value Date   TSH 3.250 04/04/2021   Lab Results  Component Value Date   HGBA1C 6.5 02/21/2021     BNP No results found for: BNP  ProBNP No results found for: PROBNP   Lipid Panel     Component Value Date/Time   CHOL 158 04/04/2021 0918   TRIG 71 04/04/2021 0918   HDL 51 04/04/2021 0918   CHOLHDL 3.1 04/04/2021 0918   CHOLHDL 3 02/21/2021 1505   VLDL 20.8 02/21/2021 1505   LDLCALC 93 04/04/2021 0918   LDLCALC 92 03/08/2020 1602   LABVLDL 14 04/04/2021 0918     RADIOLOGY: No results found.   Additional studies/ records that were reviewed today include:  I reviewed the patient's emergency room evaluation as well as clinical studies.  She sees Dr. Betty [name] for primary care.  ASSESSMENT:    1. Essential hypertension   2. Abnormal EKG   3. PVC's (premature ventricular contractions)   4. Hyperlipidemia    5. Prolonged Q-T interval on ECG     PLAN:  Ms.Mysti Jowers is a very pleasant 80 year old female who is originally from [place] who has been living in the [place] since 1999.  She often goes back to [place] frequently and is scheduled to return on April 17, 2021 and be there for approximately 6 months.  She was recently seen in the emergency room with complaints of 3 days of  nonexertional constant chest discomfort which seem to radiate in her back.  CT imaging did not reveal any evidence for aortic aneurysm or dissection.  She was noted to have very mild aortic atherosclerosis as well as coronary calcification.  She was found to have mild diffuse fatty infiltration of her liver and there was no evidence for any active pulmonary disease or bowel obstruction or inflammation.  She does not give a history suggesting angina pectoris.  I suspect her chest pain symptomatology was most likely musculoskeletal in etiology.  Her blood pressure today is elevated.  Of note, when she was seen in the emergency room her resting pulse was 80 and she did have occasional PVCs with transient ventricular bigeminy.  With her blood pressure elevation and her current resting pulse in the mid 90s, I have recommended the addition of metoprolol succinate 25 mg daily.  I am recommending she undergo fasting laboratory with a comprehensive metabolic panel, TSH, CBC and lipid studies in the a.m.  Of note in the emergency room her potassium was 3.5 after a probable hemolyzed specimen which showed 5.8.  Troponins were negative.  Her ECG showed today showed normal sinus rhythm at 96 bpm with Q waves inferiorly and laterally without ectopy.  QTc interval was minimally prolonged at 462 ms. I am trying to expedite her to get a 2D echo Doppler study prior to leaving for [place] in 2 weeks.  I will work her into  my office schedule and see her in 1 week for reassessment of initiation of metoprolol to her medical therapy.  If blood pressure continues to be elevated additional treatment will be recommended but I will defer this until we results of her laboratory are available.   Medication Adjustments/Labs and Tests Ordered: Current medicines are reviewed at length with the patient today.  Concerns regarding medicines are outlined above.  Medication changes, Labs and Tests ordered today are listed in the Patient Instructions  below. Patient Instructions  Medication Instructions:  Start metoprolol 25 mg once a day.  *If you need a refill on your cardiac medications before your next appointment, please call your pharmacy*   Lab Work: Please return for fasting lab work in the morning (cmet, cbc, lipids, tsh) If you have labs (blood work) drawn today and your tests are completely normal, you will receive your results only by: [place] (if you have MyChart) OR A paper copy in the mail If you have any lab test that is abnormal or we need to change your treatment, we will call you to review the results.   Testing/Procedures: Your physician has requested that you have an echocardiogram. Echocardiography is a painless test that uses sound waves to create images of your heart. It provides your doctor with information about the size and shape of your heart and how well your heart's chambers and valves are working. This procedure takes approximately one hour. There are no restrictions for this procedure.   Follow-Up: At [facility], you and your health needs are our priority.  As part of our continuing mission to provide you with exceptional heart care, we have created designated Provider Care Teams.  These Care Teams include your primary Cardiologist (physician) and Advanced Practice Providers (APPs -  Physician Assistants and Nurse Practitioners) who all work together to provide you with the care you need, when you need it.  We recommend signing up for the patient portal called "MyChart".  Sign up information is provided on this After Visit Summary.  MyChart is used to connect with patients for Virtual Visits (Telemedicine).  Patients are able to view lab/test results, encounter notes, upcoming appointments, etc.  Non-urgent messages can be sent to your provider as well.   To learn more about what you can do with MyChart, go to [email or web address].    Your next appointment:   April 12, 2021 at 9 am  The  format for your next appointment:   In Person  Provider:   Shelva Majestic, MD     Signed, Shelva Majestic, MD  04/05/2021 12:29 PM    [facility] [address], [place], [place], [place]  [address] Phone: [phone]

## 2021-04-04 ENCOUNTER — Ambulatory Visit (INDEPENDENT_AMBULATORY_CARE_PROVIDER_SITE_OTHER): Payer: Medicare Other | Admitting: Family Medicine

## 2021-04-04 ENCOUNTER — Telehealth: Payer: Self-pay

## 2021-04-04 ENCOUNTER — Encounter: Payer: Self-pay | Admitting: Family Medicine

## 2021-04-04 VITALS — BP 130/80 | HR 87 | Resp 16 | Ht 61.0 in | Wt 133.5 lb

## 2021-04-04 DIAGNOSIS — I739 Peripheral vascular disease, unspecified: Secondary | ICD-10-CM | POA: Diagnosis not present

## 2021-04-04 DIAGNOSIS — G63 Polyneuropathy in diseases classified elsewhere: Secondary | ICD-10-CM | POA: Diagnosis not present

## 2021-04-04 DIAGNOSIS — E1169 Type 2 diabetes mellitus with other specified complication: Secondary | ICD-10-CM

## 2021-04-04 DIAGNOSIS — E785 Hyperlipidemia, unspecified: Secondary | ICD-10-CM | POA: Diagnosis not present

## 2021-04-04 DIAGNOSIS — E1149 Type 2 diabetes mellitus with other diabetic neurological complication: Secondary | ICD-10-CM | POA: Diagnosis not present

## 2021-04-04 DIAGNOSIS — M79645 Pain in left finger(s): Secondary | ICD-10-CM | POA: Diagnosis not present

## 2021-04-04 DIAGNOSIS — K219 Gastro-esophageal reflux disease without esophagitis: Secondary | ICD-10-CM

## 2021-04-04 DIAGNOSIS — I10 Essential (primary) hypertension: Secondary | ICD-10-CM | POA: Diagnosis not present

## 2021-04-04 LAB — COMPREHENSIVE METABOLIC PANEL
ALT: 17 IU/L (ref 0–32)
AST: 22 IU/L (ref 0–40)
Albumin/Globulin Ratio: 2 (ref 1.2–2.2)
Albumin: 4.8 g/dL — ABNORMAL HIGH (ref 3.7–4.7)
Alkaline Phosphatase: 131 IU/L — ABNORMAL HIGH (ref 44–121)
BUN/Creatinine Ratio: 13 (ref 12–28)
BUN: 10 mg/dL (ref 8–27)
Bilirubin Total: 0.5 mg/dL (ref 0.0–1.2)
CO2: 25 mmol/L (ref 20–29)
Calcium: 9.3 mg/dL (ref 8.7–10.3)
Chloride: 103 mmol/L (ref 96–106)
Creatinine, Ser: 0.75 mg/dL (ref 0.57–1.00)
Globulin, Total: 2.4 g/dL (ref 1.5–4.5)
Glucose: 101 mg/dL — ABNORMAL HIGH (ref 70–99)
Potassium: 4.5 mmol/L (ref 3.5–5.2)
Sodium: 142 mmol/L (ref 134–144)
Total Protein: 7.2 g/dL (ref 6.0–8.5)
eGFR: 81 mL/min/{1.73_m2} (ref >59)

## 2021-04-04 LAB — CBC
Hematocrit: 35.9 % (ref 34.0–46.6)
Hemoglobin: 11.9 g/dL (ref 11.1–15.9)
MCH: 29.5 pg (ref 26.6–33.0)
MCHC: 33.1 g/dL (ref 31.5–35.7)
MCV: 89 fL (ref 79–97)
Platelets: 250 10*3/uL (ref 150–450)
RBC: 4.03 x10E6/uL (ref 3.77–5.28)
RDW: 12.6 % (ref 11.7–15.4)
WBC: 5 10*3/uL (ref 3.4–10.8)

## 2021-04-04 LAB — LIPID PANEL
Chol/HDL Ratio: 3.1 ratio (ref 0.0–4.4)
Cholesterol, Total: 158 mg/dL (ref 100–199)
HDL: 51 mg/dL (ref >39)
LDL Chol Calc (NIH): 93 mg/dL (ref 0–99)
Triglycerides: 71 mg/dL (ref 0–149)
VLDL Cholesterol Cal: 14 mg/dL (ref 5–40)

## 2021-04-04 LAB — TSH: TSH: 3.25 u[IU]/mL (ref 0.450–4.500)

## 2021-04-04 MED ORDER — PRAVASTATIN SODIUM 20 MG PO TABS: 20.0000 mg | ORAL_TABLET | Freq: Every day | ORAL | 2 refills | Status: DC

## 2021-04-04 MED ORDER — DULOXETINE HCL 30 MG PO CPEP: 30.0000 mg | ORAL_CAPSULE | Freq: Every day | ORAL | 0 refills | Status: DC

## 2021-04-04 MED ORDER — PANTOPRAZOLE SODIUM 40 MG PO TBEC: 40.0000 mg | DELAYED_RELEASE_TABLET | Freq: Every day | ORAL | 0 refills | Status: DC

## 2021-04-04 MED ORDER — LOSARTAN POTASSIUM 50 MG PO TABS: 50.0000 mg | ORAL_TABLET | Freq: Every day | ORAL | 0 refills | Status: DC

## 2021-04-04 NOTE — Assessment & Plan Note (Addendum)
Problem has been well controlled. Continue non pharmacologic treatment. Regular exercise and healthy diet with avoidance of added sugar food intake to continue. Annual eye exam and foot care recommended. F/U in 5-6 months

## 2021-04-04 NOTE — Assessment & Plan Note (Signed)
Problem is well controlled. Continue Protonix 40 mg daily, she could try every other day. 24-month supply sent to her pharmacy, she is planning on being [place] at least [place]. Continue GERD precautions.

## 2021-04-04 NOTE — Patient Instructions (Signed)
  A few things to remember from today's visit:   Primary hypertension - Plan: losartan (COZAAR) 50 MG tablet  Type 2 diabetes mellitus with neurological complications (HCC)  Gastroesophageal reflux disease without esophagitis - Plan: pantoprazole (PROTONIX) 40 MG tablet  Polyneuropathy associated with underlying disease ([place]) - Plan: DULoxetine (CYMBALTA) 30 MG capsule  If you need refills please call your pharmacy. Do not use My Chart to request refills or for acute issues that need immediate attention.   No changes today. Monitor blood pressure at home.  Please be sure medication list is accurate. If a new problem present, please set up appointment sooner than planned today.

## 2021-04-04 NOTE — Assessment & Plan Note (Signed)
BP rechecked today and 140/75. Continue losartan 50 mg daily. Planning on starting metoprolol succinate 25 mg daily. We discussed some side effects of medications. Recommend monitoring BP regularly. Continue low-salt diet. She has a follow-up appointment with cardiologist next week.

## 2021-04-04 NOTE — Telephone Encounter (Signed)
-----   Message from Viona Gilmore, [facility] sent at 04/04/2021  3:26 PM EDT ----- Regarding: 6 month refill Hi,  Can you please send in a 180 days supply for Ms. Denicola's pravastatin to [facility]?  Thanks! Maddie

## 2021-04-04 NOTE — Progress Notes (Signed)
HPI: Chief Complaint  Patient presents with   Follow-up   [name] is a 80 y.o. female, who is here today for chronic disease management. Planning on traveling back home, [place]. She is going to stay at least 6 months overseas, she needs 6 months supply of her meds.  Since her last visit she was evaluated here in the office and Dx'ed superficial thrombophlebitis, conservative treatment recommended.  Problem has resolved.  She was cardiologist yesterday, Dr Claiborne Billings. BP was elevated yesterday at 160/80, Metoprolol succinate 25 mg daily, she has not started medication yet. She is also on Losartan 50 mg daily. She is not checking BP at home.  She is following with cardio and having and having echo next week.  BP readings at home:Not checking. Side effects:None.  Negative for unusual or severe headache, visual changes, exertional chest pain, dyspnea,  focal weakness, or edema.  Lab Results  Component Value Date   CREATININE 0.84 02/27/2021   BUN 12 02/27/2021   NA 139 02/27/2021   K 3.5 02/27/2021   CL 105 02/27/2021   CO2 25 02/27/2021   Left index pain, DIP joint for a year or so. Pain is exacerbated by palpation. She has not noted any skin color changes, cyanosis, erythema, or edema. No history of trauma. She has not tried OTC medications.  Diabetes Mellitus II:  - Checking BG at home: 90s to 110s. - Medications: On nonpharmacologic treatment. Last eye exam 12/2020. Feet burning sensation has resolved with duloxetine 30 mg daily. Gabapentin did not help. She has tolerated medication well.  - Negative for symptoms of hypoglycemia, polyuria, polydipsia, foot ulcers/trauma  Lab Results  Component Value Date   HGBA1C 6.5 02/21/2021   Lab Results  Component Value Date   MICROALBUR <0.7 01/15/2021   Hyperlipidemia on pravastatin 20 mg daily. PAD, she is also on Aspirin 81 mg daily.  Lab Results  Component Value Date   CHOL 157 02/21/2021   HDL 47.50  02/21/2021   LDLCALC 89 02/21/2021   TRIG 104.0 02/21/2021   CHOLHDL 3 02/21/2021   Review of Systems  Constitutional:  Negative for activity change, appetite change, chills and fever.  HENT:  Negative for mouth sores, nosebleeds and sore throat.   Respiratory:  Negative for cough and wheezing.   Gastrointestinal:  Negative for abdominal pain, nausea and vomiting.  Genitourinary:  Negative for decreased urine volume and hematuria.  Musculoskeletal:  Negative for gait problem, joint swelling and myalgias.  Neurological:  Negative for syncope, facial asymmetry and weakness.  Rest of ROS see pertinent positives and negatives in HPI.  Current Outpatient Medications on File Prior to Visit  Medication Sig Dispense Refill   Accu-Chek Softclix Lancets lancets Use to check blood sugar daily. Dx:e11.49 100 each 12   aspirin 81 MG tablet Take 81 mg by mouth daily.     Blood Glucose Monitoring Suppl (ACCU-CHEK AVIVA CONNECT) w/Device KIT 1 Device by Does not apply route daily. 1 kit 0   Blood Glucose Monitoring Suppl (ACCU-CHEK GUIDE ME) w/Device KIT Use to check blood sugars daily. 1 kit 0   Cholecalciferol (VITAMIN D3) 2000 units TABS Take by mouth daily.     Continuous Blood Gluc Receiver (DEXCOM G6 RECEIVER) DEVI Use to check blood sugars up to 3 times daily. 1 each 1   dorzolamide-timolol (COSOPT) 22.3-6.8 MG/ML ophthalmic solution Place 1 drop into both eyes 2 (two) times daily.     glucose blood (ACCU-CHEK GUIDE) test strip Use to  check blood sugars daily. Dx:e11.49 100 each 12   ibuprofen (ADVIL,MOTRIN) 200 MG tablet Take 200 mg by mouth every 6 (six) hours as needed.     Lancets Misc. (ACCU-CHEK SOFTCLIX LANCET DEV) KIT 1 Device by Does not apply route daily. 100 kit 3   latanoprost (XALATAN) 0.005 % ophthalmic solution 1 drop at bedtime.     metoprolol succinate (TOPROL-XL) 25 MG 24 hr tablet Take 1 tablet (25 mg total) by mouth daily. Take with or immediately following a meal. 90 tablet 3    polyethylene glycol powder (GLYCOLAX/MIRALAX) powder MIX 255 GRAM PO ONCE FOR 1 DOSE  0   pravastatin (PRAVACHOL) 20 MG tablet Take 1 tablet (20 mg total) by mouth daily. 90 tablet 2   typhoid (VIVOTIF) DR capsule Take 1 capsule by mouth every other day. For 8 days. Last dose to be completed a week before traveling. 4 capsule 0   No current facility-administered medications on file prior to visit.    Past Medical History:  Diagnosis Date   Arthritis    knees, back   Diabetes mellitus    Headache    Hypertension     Allergies  Allergen Reactions   Norvasc [Amlodipine] Other (See Comments)    Dizziness and Chest pains at night when lies down   Oxycodone-Acetaminophen Nausea And Vomiting    Social History   Socioeconomic History   Marital status: Widowed    Spouse name: Not on file   Number of children: Not on file   Years of education: Not on file   Highest education level: Not on file  Occupational History   Not on file  Tobacco Use   Smoking status: Never   Smokeless tobacco: Never  Substance and Sexual Activity   Alcohol use: No    Alcohol/week: 0.0 standard drinks   Drug use: No   Sexual activity: Not on file  Other Topics Concern   Not on file  Social History Narrative   Not on file   Social Determinants of Health   Financial Resource Strain: Low Risk    Difficulty of Paying Living Expenses: Not hard at all  Food Insecurity: No Food Insecurity   Worried About [place] in the Last Year: Never true   [place] in the Last Year: Never true  Transportation Needs: No Transportation Needs   Lack of Transportation (Medical): No   Lack of Transportation (Non-Medical): No  Physical Activity: Insufficiently Active   Days of Exercise per Week: 5 days   Minutes of Exercise per Session: 10 min  Stress: No Stress Concern Present   Feeling of Stress : Not at all  Social Connections: Moderately Isolated   Frequency of Communication with Friends and  Family: Twice a week   Frequency of Social Gatherings with Friends and Family: Twice a week   Attends Religious Services: 1 to 4 times per year   Active Member of [employer] or Organizations: No   Attends Archivist Meetings: Never   Marital Status: Never married   Today's Vitals   04/04/21 1300  BP: 130/80  Pulse: 87  Resp: 16  SpO2: 97%  Weight: 133 lb 8 oz (60.6 kg)  Height: _0  (1.549 m)   Body mass index is 25.22 kg/m.  Physical Exam Vitals and nursing note reviewed.  Constitutional:      General: She is not in acute distress.    Appearance: She is well-developed.  HENT:  Head: Normocephalic and atraumatic.     Mouth/Throat:     Mouth: Mucous membranes are moist.     Pharynx: Oropharynx is clear.  Eyes:     Conjunctiva/sclera: Conjunctivae normal.  Cardiovascular:     Rate and Rhythm: Normal rate and regular rhythm.     Pulses:          Dorsalis pedis pulses are 2+ on the right side and 2+ on the left side.     Heart sounds: No murmur heard. Pulmonary:     Effort: Pulmonary effort is normal. No respiratory distress.     Breath sounds: Normal breath sounds.  Abdominal:     Palpations: Abdomen is soft. There is no hepatomegaly or mass.     Tenderness: There is no abdominal tenderness.  Musculoskeletal:       Hands:  Lymphadenopathy:     Cervical: No cervical adenopathy.  Skin:    General: Skin is warm.     Findings: No erythema or rash.  Neurological:     General: No focal deficit present.     Mental Status: She is alert and oriented to person, place, and time.     Cranial Nerves: No cranial nerve deficit.     Gait: Gait normal.  Psychiatric:     Comments: Well groomed, good eye contact.   ASSESSMENT AND PLAN:  Ms.Elaynah was seen today for follow-up.  Diagnoses and all orders for this visit:  Pain in finger of left hand No significant findings on examination today except for tenderness. ? OA. She is not interested in imaging at this  time. Recommend monitoring for new symptoms. She can try topical IcyHot or Aspercreme.  Type 2 diabetes mellitus with neurological complications ([place]) Problem has been well controlled. Continue non pharmacologic treatment. Regular exercise and healthy diet with avoidance of added sugar food intake to continue. Annual eye exam and foot care recommended. F/U in 5-6 months  Polyneuropathy associated with underlying disease ([place]) Well controlled. Continue Duloxetine 30 mg daily, 6 months supply sent. Appropriate foot care discussed.  Primary hypertension BP rechecked today and 140/75. Continue losartan 50 mg daily. Planning on starting metoprolol succinate 25 mg daily. We discussed some side effects of medications. Recommend monitoring BP regularly. Continue low-salt diet. She has a follow-up appointment with cardiologist next week.  GERD (gastroesophageal reflux disease) Problem is well controlled. Continue Protonix 40 mg daily, she could try every other day. 2-monthsupply sent to her pharmacy, she is planning on being [place] at [place]. Continue GERD precautions.   Return in about 6 months (around 10/03/2021) for DM II,HTN.   Raylene Carmickle JMartinique MD [facility].

## 2021-04-04 NOTE — Assessment & Plan Note (Signed)
Well controlled. Continue Duloxetine 30 mg daily, 6 months supply sent. Appropriate foot care discussed.

## 2021-04-05 ENCOUNTER — Encounter: Payer: Self-pay | Admitting: Cardiovascular Disease

## 2021-04-10 ENCOUNTER — Other Ambulatory Visit: Payer: Self-pay

## 2021-04-10 ENCOUNTER — Other Ambulatory Visit (HOSPITAL_BASED_OUTPATIENT_CLINIC_OR_DEPARTMENT_OTHER): Payer: Self-pay | Admitting: Cardiovascular Disease

## 2021-04-10 ENCOUNTER — Ambulatory Visit (INDEPENDENT_AMBULATORY_CARE_PROVIDER_SITE_OTHER): Payer: Medicare Other

## 2021-04-10 DIAGNOSIS — R9431 Abnormal electrocardiogram [ECG] [EKG]: Secondary | ICD-10-CM

## 2021-04-10 LAB — ECHOCARDIOGRAM COMPLETE
Area-P 1/2: 3.1 cm2
MV M vel: 6.16 m/s
MV Peak grad: 151.8 mmHg
S' Lateral: 1.89 cm

## 2021-04-12 ENCOUNTER — Other Ambulatory Visit: Payer: Self-pay

## 2021-04-12 ENCOUNTER — Encounter: Payer: Self-pay | Admitting: Cardiovascular Disease

## 2021-04-12 ENCOUNTER — Ambulatory Visit (INDEPENDENT_AMBULATORY_CARE_PROVIDER_SITE_OTHER): Payer: Medicare Other | Admitting: Cardiovascular Disease

## 2021-04-12 DIAGNOSIS — I10 Essential (primary) hypertension: Secondary | ICD-10-CM | POA: Diagnosis not present

## 2021-04-12 DIAGNOSIS — E785 Hyperlipidemia, unspecified: Secondary | ICD-10-CM | POA: Diagnosis not present

## 2021-04-12 DIAGNOSIS — E1169 Type 2 diabetes mellitus with other specified complication: Secondary | ICD-10-CM

## 2021-04-12 DIAGNOSIS — I5189 Other ill-defined heart diseases: Secondary | ICD-10-CM

## 2021-04-12 DIAGNOSIS — R9431 Abnormal electrocardiogram [ECG] [EKG]: Secondary | ICD-10-CM

## 2021-04-12 NOTE — Progress Notes (Signed)
Cardiology Office Note    Date:  04/14/2021   ID:  Julie, Schmidt 10-08-1940, MRN [id number]  PCP:  Julie, [name], MD  Cardiologist:  Julie Majestic, MD   1 week follow-up cardiology evaluation, initially referred to the courtesy of Dr. Marin Schmidt from [facility] emergency room for evaluation of atypical chest pain.   History of Present Illness:  Julie Schmidt is a 80 y.o. female who is originally from [place], [place].  She has a history of hypertension, hyperlipidemia, and was recently evaluated in the [facility], ER on February 27, 2021 for chest pain.  At that time, she presented complaining of 3 days of constant central chest discomfort.  The pain was described as heavy and radiating to her back.  It was worse with movement.  She denied any precipitation of her chest discomfort with exertion.  There was no associated fever, cough, abdominal pain, nausea vomiting lower extremity edema.  She denies any recent trauma.  During her evaluation, she underwent CT angio of her chest which showed scattered calcification in the aorta and coronary arteries.  There was no evidence for thoracic or abdominal aortic aneurysm or dissection.  There was no evidence for active pulmonary disease.  There was mild fatty infiltration of her liver.  There is no evidence for bowel obstruction or inflammation.  It was felt that her chest pain was noncardiac.  ECG in the emergency room showed sinus rhythm with occasional to frequent PVCs with several beats of bigeminal rhythm.  Ventricular rate was 80 bpm. The patient will be going to [place] next week for approximately 6 months.  Cardiology evaluation was recommended and she presents to the office today for evaluation.  When I saw her for her initial evaluation with me on April 03, 2021 she was onlosartan 50 mg daily for hypertension for several months and has been on pravastatin 20 mg daily for hyperlipidemia.  She takes Cymbalta 30 mg daily as well as a baby  aspirin.  She denied any exertional angina pectoris.  At times there is minimal shortness of breath with activity.  She is unaware of palpitations.  She denies presyncope or syncope.  She had undergone a lower extremity Doppler study which had been ordered by Dr. Betty Julie on February 17, 2020 which essentially was normal.  When I initially saw her, her pulse was in the mid 90s blood pressure was elevated.  As result I recommended the addition of low-dose metoprolol succinate 25 mg daily.  I recommended she undergo fasting laboratory.  Of note, when she was in the emergency room her potassium on 1 specimen was 5.8 and on another specimen was 3.5.  Her troponins remain negative.  I recommended she undergo a 2D echo Doppler study.  At that time she told me that she was leaving for [place] on November 8 and would be there for at least 6 months.  As result, I worked her into my office today to see her prior to her departure make certain she was tolerating the metoprolol and did not need additional medication adjustment before her long stay in [place].  Over the past week she has done well.  Her echo Doppler study was done on April 10, 2021 which showed an EF of 55 to 60% without wall motion abnormalities.  There was grade 2 diastolic dysfunction.  Laboratory now showed a potassium of 4.5.  Lipid studies revealed total cholesterol 158, triglycerides 71, HDL 51, and LDL  93.  TSH was normal at 3.25.  Hemoglobin hematocrit were stable however she did not receive her dose of metoprolol succinate until late yesterday.  As result she had not yet taken a dose prior to today's evaluation.  She denies any chest pain.  She denies any dizziness.  Her resting pulse has improved.  Past Medical History:  Diagnosis Date   Arthritis    knees, back   Diabetes mellitus    Headache    Hypertension     Past Surgical History:  Procedure Laterality Date   ABDOMINAL HYSTERECTOMY     APPENDECTOMY     BRAIN  SURGERY  05/19/2014   Craniotomy for meningioma   CARDIAC CATHETERIZATION N/A 11/25/2014   Procedure: Left Heart Cath and Coronary Angiography;  Surgeon: Jerline Pain, MD;  Location: [facility];  Service: Cardiovascular;  Laterality: N/A;   CESAREAN SECTION     CRANIOTOMY Left 05/19/2014   Procedure: CRANIOTOMY TUMOR EXCISION with Curve;  Surgeon: Erline Levine, MD;  Location: [facility];  Service: Neurosurgery;  Laterality: Left;  Left frontal craniotomy for meningioma with brain lab    Current Medications: Outpatient Medications Prior to Visit  Medication Sig Dispense Refill   Accu-Chek Softclix Lancets lancets Use to check blood sugar daily. Dx:e11.49 100 each 12   aspirin 81 MG tablet Take 81 mg by mouth daily.     Blood Glucose Monitoring Suppl (ACCU-CHEK AVIVA CONNECT) w/Device KIT 1 Device by Does not apply route daily. 1 kit 0   Blood Glucose Monitoring Suppl (ACCU-CHEK GUIDE ME) w/Device KIT Use to check blood sugars daily. 1 kit 0   Cholecalciferol (VITAMIN D3) 2000 units TABS Take by mouth daily.     Continuous Blood Gluc Receiver (DEXCOM G6 RECEIVER) DEVI Use to check blood sugars up to 3 times daily. 1 each 1   dorzolamide-timolol (COSOPT) 22.3-6.8 MG/ML ophthalmic solution Place 1 drop into both eyes 2 (two) times daily.     DULoxetine (CYMBALTA) 30 MG capsule Take 1 capsule (30 mg total) by mouth daily. 180 capsule 0   glucose blood (ACCU-CHEK GUIDE) test strip Use to check blood sugars daily. Dx:e11.49 100 each 12   ibuprofen (ADVIL,MOTRIN) 200 MG tablet Take 200 mg by mouth every 6 (six) hours as needed.     Lancets Misc. (ACCU-CHEK SOFTCLIX LANCET DEV) KIT 1 Device by Does not apply route daily. 100 kit 3   latanoprost (XALATAN) 0.005 % ophthalmic solution 1 drop at bedtime.     losartan (COZAAR) 50 MG tablet Take 1 tablet (50 mg total) by mouth daily. 180 tablet 0   metoprolol succinate (TOPROL-XL) 25 MG 24 hr tablet Take 1 tablet (25 mg total) by mouth daily.  Take with or immediately following a meal. 90 tablet 3   pantoprazole (PROTONIX) 40 MG tablet Take 1 tablet (40 mg total) by mouth daily. 180 tablet 0   polyethylene glycol powder (GLYCOLAX/MIRALAX) powder MIX 255 GRAM PO ONCE FOR 1 DOSE  0   pravastatin (PRAVACHOL) 20 MG tablet Take 1 tablet (20 mg total) by mouth daily. 180 tablet 2   typhoid (VIVOTIF) DR capsule Take 1 capsule by mouth every other day. For 8 days. Last dose to be completed a week before traveling. 4 capsule 0   No facility-administered medications prior to visit.     Allergies:   Norvasc [amlodipine] and Oxycodone-acetaminophen   Social History   Socioeconomic History   Marital status: Widowed    Spouse name: Not  on file   Number of children: Not on file   Years of education: Not on file   Highest education level: Not on file  Occupational History   Not on file  Tobacco Use   Smoking status: Never   Smokeless tobacco: Never  Substance and Sexual Activity   Alcohol use: No    Alcohol/week: 0.0 standard drinks   Drug use: No   Sexual activity: Not on file  Other Topics Concern   Not on file  Social History Narrative   Not on file   Social Determinants of Health   Financial Resource Strain: Low Risk    Difficulty of Paying Living Expenses: Not hard at all  Food Insecurity: No Food Insecurity   Worried About Charity fundraiser in the Last Year: Never true   [place] in the Last Year: Never true  Transportation Needs: No Transportation Needs   Lack of Transportation (Medical): No   Lack of Transportation (Non-Medical): No  Physical Activity: Insufficiently Active   Days of Exercise per Week: 5 days   Minutes of Exercise per Session: 10 min  Stress: No Stress Concern Present   Feeling of Stress : Not at all  Social Connections: Moderately Isolated   Frequency of Communication with Friends and Family: Twice a week   Frequency of Social Gatherings with Friends and Family: Twice a week   Attends  Religious Services: 1 to 4 times per year   Active Member of [employer] or Organizations: No   Attends Archivist Meetings: Never   Marital Status: Never married    Socially she was born in [place], [place].  She moved to [place] in 1999.  Apparently, her mother had 4 children and her father at 68 children from 54 different wives.  She is widowed since 56.  She has 1 daughter.  She completed high school.  There is no history of tobacco use.  She does not drink alcohol.  She does walk at least 3 times per week.  Family History:  The patient's family history includes Cancer in her father.  Her mother died of unknown cause.  Father had cancer and died at 57.  ROS General: Negative; No fevers, chills, or night sweats;  HEENT: Negative; No changes in vision or hearing, sinus congestion, difficulty swallowing Pulmonary: Negative; No cough, wheezing, shortness of breath, hemoptysis Cardiovascular: See HPI GI: Negative; No nausea, vomiting, diarrhea, or abdominal pain GU: Negative; No dysuria, hematuria, or difficulty voiding Musculoskeletal: Negative; no myalgias, joint pain, or weakness Hematologic/Oncology: Negative; no easy bruising, bleeding Endocrine: Negative; no heat/cold intolerance; no diabetes Neuro: Negative; no changes in balance, headaches Skin: Negative; No rashes or skin lesions Psychiatric: Negative; No behavioral problems, depression Sleep: Negative; No snoring, daytime sleepiness, hypersomnolence, bruxism, restless legs, hypnogognic hallucinations, no cataplexy Other comprehensive 14 point system review is negative.   PHYSICAL EXAM:   VS:  BP (!) 162/48   Pulse 65   Ht '5\' 1"'  (1.549 m)   Wt 133 lb (60.3 kg)   SpO2 98%   BMI 25.13 kg/m     Pete blood pressure by me was 148/70  Wt Readings from Last 3 Encounters:  04/12/21 133 lb (60.3 kg)  04/04/21 133 lb 8 oz (60.6 kg)  04/03/21 135 lb 3.2 oz (61.3 kg)    General: Alert, oriented, no distress.   Skin: normal turgor, no rashes, warm and dry HEENT: Normocephalic, atraumatic. Pupils equal round and reactive to light; sclera  anicteric; extraocular muscles intact; Nose without nasal septal hypertrophy Mouth/Parynx benign; Mallinpatti scale 3 Neck: No JVD, no carotid bruits; normal carotid upstroke Lungs: clear to ausculatation and percussion; no wheezing or rales Chest wall: without tenderness to palpitation Heart: PMI not displaced, RRR, s1 s2 normal, 1/6 systolic murmur, no diastolic murmur, no rubs, gallops, thrills, or heaves Abdomen: soft, nontender; no hepatosplenomehaly, BS+; abdominal aorta nontender and not dilated by palpation. Back: no CVA tenderness Pulses 2+ Musculoskeletal: full range of motion, normal strength, no joint deformities Extremities: no clubbing cyanosis or edema, Homan's sign negative  Neurologic: grossly nonfocal; Cranial nerves grossly wnl Psychologic: Normal mood and affect   Studies/Labs Reviewed:   April 12, 2021 ECG (independently read by me): Sinus rhythm at 65, PVC, LAD  April 03, 2021 ECG (independently read by me):  NSR at 96, LAD, Q waves, inferiorly and anterolaterally, QTc 462 msec  Recent Labs: BMP Latest Ref Rng & Units 04/04/2021 02/27/2021 02/27/2021  Glucose 70 - 99 mg/dL 101(H) 192(H) 120(H)  BUN 8 - 27 mg/dL '10 12 15  ' Creatinine 0.57 - 1.00 mg/dL 0.75 0.84 0.84  BUN/Creat Ratio 12 - 28 13 - -  Sodium 134 - 144 mmol/L 142 139 138  Potassium 3.5 - 5.2 mmol/L 4.5 3.5 5.8(H)  Chloride 96 - 106 mmol/L 103 105 105  CO2 20 - 29 mmol/L '25 25 22  ' Calcium 8.7 - 10.3 mg/dL 9.3 9.1 9.3     Hepatic Function Latest Ref Rng & Units 04/04/2021 01/05/2021 09/13/2020  Total Protein 6.0 - 8.5 Schmidt/dL 7.2 7.3 7.3  Albumin 3.7 - 4.7 Schmidt/dL 4.8(H) 3.8 4.3  AST 0 - 40 IU/L '22 29 30  ' ALT 0 - 32 IU/L '17 24 31  ' Alk Phosphatase 44 - 121 IU/L 131(H) 92 118(H)  Total Bilirubin 0.0 - 1.2 mg/dL 0.5 0.7 0.5  Bilirubin, Direct 0.0 - 0.2 mg/dL - - -    CBC  Latest Ref Rng & Units 04/04/2021 02/27/2021 01/05/2021  WBC 3.4 - 10.8 x10E3/uL 5.0 6.3 4.3  Hemoglobin 11.1 - 15.9 Schmidt/dL 11.9 12.4 11.3(L)  Hematocrit 34.0 - 46.6 % 35.9 37.9 35.5(L)  Platelets 150 - 450 x10E3/uL 250 229 275   Lab Results  Component Value Date   MCV 89 04/04/2021   MCV 91.8 02/27/2021   MCV 94.2 01/05/2021   Lab Results  Component Value Date   TSH 3.250 04/04/2021   Lab Results  Component Value Date   HGBA1C 6.5 02/21/2021     BNP No results found for: BNP  ProBNP No results found for: PROBNP   Lipid Panel     Component Value Date/Time   CHOL 158 04/04/2021 0918   TRIG 71 04/04/2021 0918   HDL 51 04/04/2021 0918   CHOLHDL 3.1 04/04/2021 0918   CHOLHDL 3 02/21/2021 1505   VLDL 20.8 02/21/2021 1505   LDLCALC 93 04/04/2021 0918   LDLCALC 92 03/08/2020 1602   LABVLDL 14 04/04/2021 0918     RADIOLOGY: ECHOCARDIOGRAM COMPLETE  Result Date: 04/10/2021 bas     ECHOCARDIOGRAM REPORT   Patient Name:   Julie Schmidt Date of Exam: 04/10/2021 Medical Rec #:  [id number]      Height:       61.0 in Accession #:    [id number]     Weight:       133.5 lb Date of Birth:  September 27, 1940      BSA:          1.591 m Patient Age:  80 years       BP:           130/80 mmHg Patient Gender: F              HR:           69 bpm. Exam Location:  Outpatient Procedure: 2D Echo, Color Doppler and Cardiac Doppler Indications:    R94.31 Abnormal EKG  History:        Patient has no prior history of Echocardiogram examinations.                 Risk Factors:Hypertension, Diabetes and Dyslipidemia. PAD,                 Prolonged Q-T interval on ECG.  Sonographer:    Leavy Cella RDCS Referring Phys: [place]  1. Left ventricular ejection fraction, by estimation, is 55 to 60%. The left ventricle has normal function. The left ventricle has no regional wall motion abnormalities. Left ventricular diastolic parameters are consistent with Grade II diastolic dysfunction  (pseudonormalization). Basal septal hypertrophy noted. Study is imaged off axis.  2. The mitral valve is grossly normal in structure. Moderate mitral valve regurgitation: there are two jets of mitral regurgitation noted. Study may underestimated level of regurgitation, consider secondary imaging modality (TEE vs CMR) if clinically indicated.  3. Left atrial size was mildly dilated. Atrial septum bows to the right suggestiving elevated LA pressures.  4. The aortic valve is tricuspid. Aortic valve regurgitation is not visualized. Mild to moderate aortic valve sclerosis/calcification is present, without any evidence of aortic stenosis.  5. The inferior vena cava is normal in size with greater than 50% respiratory variability, suggesting right atrial pressure of 3 mmHg.  6. Right ventricular systolic function is normal. The right ventricular size is normal. There is normal pulmonary artery systolic pressure. The estimated right ventricular systolic pressure is 16.1 mmHg. Comparison(s): No prior Echocardiogram. Conclusion(s)/Recommendation(s): Frequent PVCs through study. FINDINGS  Left Ventricle: BSE. Left ventricular ejection fraction, by estimation, is 55 to 60%. The left ventricle has normal function. The left ventricle has no regional wall motion abnormalities. The left ventricular internal cavity size was small. There is no left ventricular hypertrophy. Left ventricular diastolic parameters are consistent with Grade II diastolic dysfunction (pseudonormalization). Right Ventricle: The right ventricular size is normal. No increase in right ventricular wall thickness. Right ventricular systolic function is normal. There is normal pulmonary artery systolic pressure. The tricuspid regurgitant velocity is 2.69 m/s, and  with an assumed right atrial pressure of 3 mmHg, the estimated right ventricular systolic pressure is 09.6 mmHg. Left Atrium: Left atrial size was mildly dilated. Right Atrium: Right atrial size was  normal in size. Pericardium: There is no evidence of pericardial effusion. Mitral Valve: The mitral valve is normal in structure. Moderate mitral valve regurgitation. Tricuspid Valve: The tricuspid valve is normal in structure. Tricuspid valve regurgitation is mild . No evidence of tricuspid stenosis. Aortic Valve: The aortic valve is tricuspid. Aortic valve regurgitation is not visualized. Mild to moderate aortic valve sclerosis/calcification is present, without any evidence of aortic stenosis. Pulmonic Valve: The pulmonic valve was not well visualized. Pulmonic valve regurgitation is mild. No evidence of pulmonic stenosis. Aorta: The aortic root and ascending aorta are structurally normal, with no evidence of dilitation. Venous: The inferior vena cava is normal in size with greater than 50% respiratory variability, suggesting right atrial pressure of 3 mmHg. IAS/Shunts: There is right bowing of the interatrial septum,  suggestive of elevated left atrial pressure. The atrial septum is grossly normal.  LEFT VENTRICLE PLAX 2D LVIDd:         3.06 cm   Diastology LVIDs:         1.89 cm   LV e' medial:    5.11 cm/s LV PW:         1.19 cm   LV E/e' medial:  21.9 LV IVS:        1.07 cm   LV e' lateral:   7.94 cm/s LVOT diam:     1.90 cm   LV E/e' lateral: 14.1 LV SV:         75 LV SV Index:   47 LVOT Area:     2.84 cm  RIGHT VENTRICLE RV Basal diam:  3.61 cm RV Mid diam:    2.61 cm RV S prime:     9.25 cm/s TAPSE (M-mode): 1.6 cm LEFT ATRIUM             Index        RIGHT ATRIUM           Index LA diam:        3.20 cm 2.01 cm/m   RA Area:     14.90 cm LA Vol (A2C):   39.3 ml 24.71 ml/m  RA Volume:   41.90 ml  26.34 ml/m LA Vol (A4C):   56.2 ml 35.33 ml/m LA Biplane Vol: 50.0 ml 31.43 ml/m  AORTIC VALVE LVOT Vmax:   102.00 cm/s LVOT Vmean:  70.600 cm/s LVOT VTI:    0.265 m  AORTA Ao Root diam: 3.20 cm Ao Asc diam:  3.00 cm MITRAL VALVE                TRICUSPID VALVE MV Area (PHT): 3.10 cm     TR Peak grad:   28.9  mmHg MV Decel Time: 245 msec     TR Vmax:        269.00 cm/s MR Peak grad: 151.8 mmHg MR Vmax:      616.00 cm/s   SHUNTS MV E velocity: 112.00 cm/s  Systemic VTI:  0.26 m MV A velocity: 108.00 cm/s  Systemic Diam: 1.90 cm MV E/A ratio:  1.04 Rudean Haskell MD Electronically signed by Rudean Haskell MD Signature Date/Time: 04/10/2021/2:24:39 PM    Final      Additional studies/ records that were reviewed today include:  I reviewed the patient's emergency room evaluation as well as clinical studies.  She sees Dr. Betty Julie for primary care.  ASSESSMENT:    1. Essential hypertension   2. Grade II diastolic dysfunction   3. PVCs   4. Hyperlipidemia      PLAN:  [name] is a very pleasant 80 year old female who is originally from [place] who has been living in the [place] since 1999.  She often goes back to [place] frequently and is scheduled to return on April 17, 2021 and be there for approximately 6 months.  She was seen in the emergency room on February 27, 2021 with complaints of 3 days of nonexertional constant chest discomfort which seem to radiate in her back.  CT imaging did not reveal any evidence for aortic aneurysm or dissection.  She was noted to have very mild aortic atherosclerosis as well as coronary calcification.  She was found to have mild diffuse fatty infiltration of her liver and there was no evidence for any active pulmonary disease or bowel obstruction or  inflammation.  She does not give a history suggesting angina pectoris.  I initially saw her, my suspicion was most likely her chest pain was of musculoskeletal etiology.  At her initial evaluation with me, her resting pulse was in the mid 90s and she was having occasional PVCs with transient ventricular bigeminy.  As result, I recommended she initiate low-dose metoprolol succinate 25 mg.  I worked her into my schedule to be seen today after 1 week of presumed therapy.  Apparently, unfortunately she  did not receive the medication until late yesterday and did not take it yet this today prior to her evaluation.  As result I was unable to evaluate whether or not her dose was appropriate or additional medication is necessary.  However with her resting pulse now in the mid 60s rather than 98 other than initiate metoprolol succinate at 25 mg I have suggested she start initially with 12.5 mg for the next several days.  We will monitor heart rate and blood pressure.  If her blood pressure is consistently above 135 and pulse is in the 60s or 70s she can try increasing the dose to 25 mg daily.  However if her pulse is in the 50s and blood pressure is consistently less than 135 she will just continue to 12.5 mg regimen. I reviewed her laboratory with her in detail as well as her 2D echo Doppler study.  I suspect her initial elevated potassium when she presented to the emergency room may have been on hemolyzed specimen. I will see her upon her return from [place]   Medication Adjustments/Labs and Tests Ordered: Current medicines are reviewed at length with the patient today.  Concerns regarding medicines are outlined above.  Medication changes, Labs and Tests ordered today are listed in the Patient Instructions below. Patient Instructions  Medication Instructions:  FOR THE NEXT TWO DAYS, TAKE 12.5 MG OF METOPROLOL (ONE-HALF TABLET). AFTER THAT, TAKE THE WHOLE TABLET (25 MG) KEEP A DIARY OF YOUR BLOOD PRESSURE AND HEART RATE. THE GOAL IS FOR A BLOOD PRESSURE OF 120.  *If you need a refill on your cardiac medications before your next appointment, please call your pharmacy*   Follow-Up: At [facility], you and your health needs are our priority.  As part of our continuing mission to provide you with exceptional heart care, we have created designated Provider Care Teams.  These Care Teams include your primary Cardiologist (physician) and Advanced Practice Providers (APPs -  Physician Assistants and Nurse  Practitioners) who all work together to provide you with the care you need, when you need it.  We recommend signing up for the patient portal called "MyChart".  Sign up information is provided on this After Visit Summary.  MyChart is used to connect with patients for Virtual Visits (Telemedicine).  Patients are able to view lab/test results, encounter notes, upcoming appointments, etc.  Non-urgent messages can be sent to your provider as well.   To learn more about what you can do with MyChart, go to [email or web address].    Your next appointment:   8 month(s)  The format for your next appointment:   In Person  Provider:   Shelva Majestic, MD      Signed, Julie Majestic, MD  04/14/2021 4:52 PM    [facility] [address], [place], [place], [place]  [address] Phone: [phone]

## 2021-04-12 NOTE — Patient Instructions (Signed)
Medication Instructions:  FOR THE NEXT TWO DAYS, TAKE 12.5 MG OF METOPROLOL (ONE-HALF TABLET). AFTER THAT, TAKE THE WHOLE TABLET (25 MG) KEEP A DIARY OF YOUR BLOOD PRESSURE AND HEART RATE. THE GOAL IS FOR A BLOOD PRESSURE OF 120.  *If you need a refill on your cardiac medications before your next appointment, please call your pharmacy*   Follow-Up: At [facility], you and your health needs are our priority.  As part of our continuing mission to provide you with exceptional heart care, we have created designated Provider Care Teams.  These Care Teams include your primary Cardiologist (physician) and Advanced Practice Providers (APPs -  Physician Assistants and Nurse Practitioners) who all work together to provide you with the care you need, when you need it.  We recommend signing up for the patient portal called "MyChart".  Sign up information is provided on this After Visit Summary.  MyChart is used to connect with patients for Virtual Visits (Telemedicine).  Patients are able to view lab/test results, encounter notes, upcoming appointments, etc.  Non-urgent messages can be sent to your provider as well.   To learn more about what you can do with MyChart, go to [email or web address].    Your next appointment:   8 month(s)  The format for your next appointment:   In Person  Provider:   Shelva Majestic, MD

## 2021-04-14 ENCOUNTER — Encounter: Payer: Self-pay | Admitting: Cardiovascular Disease

## 2021-04-17 ENCOUNTER — Ambulatory Visit: Payer: Medicare Other | Admitting: Family Medicine

## 2021-05-31 ENCOUNTER — Telehealth: Payer: Medicare Other

## 2021-07-03 ENCOUNTER — Other Ambulatory Visit: Payer: Medicare Other

## 2021-09-25 ENCOUNTER — Other Ambulatory Visit: Payer: Self-pay | Admitting: Family Medicine

## 2021-09-25 DIAGNOSIS — K219 Gastro-esophageal reflux disease without esophagitis: Secondary | ICD-10-CM

## 2021-09-25 DIAGNOSIS — G63 Polyneuropathy in diseases classified elsewhere: Secondary | ICD-10-CM

## 2021-09-25 DIAGNOSIS — I10 Essential (primary) hypertension: Secondary | ICD-10-CM

## 2021-09-27 ENCOUNTER — Telehealth: Payer: Self-pay | Admitting: Pharmacist

## 2021-09-27 NOTE — Chronic Care Management (AMB) (Signed)
? ? ?Chronic Care Management ?Pharmacy Assistant  ? ?Name: Julie Schmidt  MRN: [id number] DOB: [birth year] ? ?Reason for Encounter: Medication Review Medication Coordination  ?  ?Conditions to be addressed/monitored: ?HTN ? ? ?Recent office visits:  ?04/04/21 [name], [name], MD - Patient presented for Primary hypertension and other concerns. No medication changes. ? ?Recent consult visits:  ?04/12/21 Troy Sine, MD (Cardiology) - Patient presented for Essential hypertension and other concerns. No medication changes ? ?04/10/21 Rudean Haskell A (Cardiology) - Claims data for Abnormal ECG/EKG. No other visit details available. ? ?04/03/21 Troy Sine, MD (Cardiology) - Patient presented for Essential hypertension and other concerns. Prescribed Metoprolol Succinate. ? ?Hospital visits:  ?None in previous 6 months ? ?Medications: ?Outpatient Encounter Medications as of 09/27/2021  ?Medication Sig  ? Accu-Chek Softclix Lancets lancets Use to check blood sugar daily. Dx:e11.49  ? aspirin 81 MG tablet Take 81 mg by mouth daily.  ? Blood Glucose Monitoring Suppl (ACCU-CHEK AVIVA CONNECT) w/Device KIT 1 Device by Does not apply route daily.  ? Blood Glucose Monitoring Suppl (ACCU-CHEK GUIDE ME) w/Device KIT Use to check blood sugars daily.  ? Cholecalciferol (VITAMIN D3) 2000 units TABS Take by mouth daily.  ? Continuous Blood Gluc Receiver (DEXCOM G6 RECEIVER) DEVI Use to check blood sugars up to 3 times daily.  ? dorzolamide-timolol (COSOPT) 22.3-6.8 MG/ML ophthalmic solution Place 1 drop into both eyes 2 (two) times daily.  ? DULoxetine (CYMBALTA) 30 MG capsule TAKE ONE CAPSULE BY MOUTH ONCE DAILY  ? glucose blood (ACCU-CHEK GUIDE) test strip Use to check blood sugars daily. Dx:e11.49  ? ibuprofen (ADVIL,MOTRIN) 200 MG tablet Take 200 mg by mouth every 6 (six) hours as needed.  ? Lancets Misc. (ACCU-CHEK SOFTCLIX LANCET DEV) KIT 1 Device by Does not apply route daily.  ? latanoprost (XALATAN) 0.005 %  ophthalmic solution 1 drop at bedtime.  ? losartan (COZAAR) 50 MG tablet TAKE ONE TABLET BY MOUTH ONCE DAILY  ? metoprolol succinate (TOPROL-XL) 25 MG 24 hr tablet Take 1 tablet (25 mg total) by mouth daily. Take with or immediately following a meal.  ? pantoprazole (PROTONIX) 40 MG tablet TAKE ONE TABLET BY MOUTH ONCE DAILY  ? polyethylene glycol powder (GLYCOLAX/MIRALAX) powder MIX 255 GRAM PO ONCE FOR 1 DOSE  ? pravastatin (PRAVACHOL) 20 MG tablet Take 1 tablet (20 mg total) by mouth daily.  ? typhoid (VIVOTIF) DR capsule Take 1 capsule by mouth every other day. For 8 days. Last dose to be completed a week before traveling.  ? ?No facility-administered encounter medications on file as of 09/27/2021.  ?Reviewed chart prior to disease state call. Spoke with patient regarding BP ? ?Recent Office Vitals: ?BP Readings from Last 3 Encounters:  ?04/12/21 (!) 162/48  ?04/04/21 130/80  ?04/03/21 (!) 160/80  ? ?Pulse Readings from Last 3 Encounters:  ?04/12/21 65  ?04/04/21 87  ?04/03/21 96  ?  ?Wt Readings from Last 3 Encounters:  ?04/12/21 133 lb (60.3 kg)  ?04/04/21 133 lb 8 oz (60.6 kg)  ?04/03/21 135 lb 3.2 oz (61.3 kg)  ?  ? ?Kidney Function ?Lab Results  ?Component Value Date/Time  ? CREATININE 0.75 04/04/2021 09:18 AM  ? CREATININE 0.84 02/27/2021 10:45 PM  ? GFR 72.09 09/13/2020 04:28 PM  ? GFRNONAA >60 02/27/2021 10:45 PM  ? GFRAA >60 06/21/2016 05:30 PM  ? ? ? ?  Latest Ref Rng & Units 04/04/2021  ?  9:18 AM 02/27/2021  ? 10:45 PM 02/27/2021  ?  2:35 PM  ?BMP  ?Glucose 70 - 99 mg/dL 101   192   120    ?BUN 8 - 27 mg/dL '10   12   15    ' ?Creatinine 0.57 - 1.00 mg/dL 0.75   0.84   0.84    ?BUN/Creat Ratio 12 - 28 13      ?Sodium 134 - 144 mmol/L 142   139   138    ?Potassium 3.5 - 5.2 mmol/L 4.5   3.5   5.8    ?Chloride 96 - 106 mmol/L 103   105   105    ?CO2 20 - 29 mmol/L '25   25   22    ' ?Calcium 8.7 - 10.3 mg/dL 9.3   9.1   9.3    ? ? ?Current antihypertensive regimen:  ?losartan 50 mg daily ?metoprolol succinate  25 mg daily ? ? ? ?BP Readings from Last 3 Encounters:  ?04/12/21 (!) 162/48  ?04/04/21 130/80  ?04/03/21 (!) 160/80  ?  ?Lab Results  ?Component Value Date  ? HGBA1C 6.5 02/21/2021  ?  ? ?Patient obtains medications through V[facility]   last delivery was 6 months supply   ? ?Last adherence delivery included:  ?Pravastatin (PRAVACHOL) 20 MG : take one tablet at Breakfast ?Duloxetine (Cymbalta) 30 MG: take one tablet at Breakfast ?Pantoprazole (PROTONIX) 40 MG: take one tablet Before Breakfast ? Losartan (COZAAR) 37.5 MG : take one tablet at Breakfast ?Dorzolamide-timolol (COSOPT) 22.3-6.8 MG/ML : one drop in each eye twice daily ?Metoprolol succinate 25 mg: take one tablet daily ?  ?Patient also requested fills for her Latanoprost eye drops added to form ? ?Patient is due for next adherence delivery on: 10/09/21. ?Called patient and reviewed medications and coordinated delivery. ?Vials for 90 DS ? ?This delivery to include: ?Losartan (COZAAR) 37.5 MG : take one tablet at Breakfast ?Duloxetine (Cymbalta) 30 MG: take one tablet at Breakfast ?Pantoprazole (PROTONIX) 40 MG: take one tablet Before Breakfast ?Latanoprost (Xalatan) 0.005% Drops : One drop in each eye at bedtime ?Dorzolamide-timolol (COSOPT) 22.3-6.8 MG/ML : one drop in each eye twice daily ?Metoprolol succinate 25 mg: take one tablet daily ?Pravastatin (PRAVACHOL) 20 MG : take one tablet at Breakfast ? ? ?Unable to reach to:  ?Confirm delivery date of 10/09/21, or leave voicemail ? ?Care Gaps: ?BP- 148/70 ( 04/12/21) ?DEXA - Overdue ?Zoster Vaccine - Overdue ?COVID Booster - Overdue ?HGB A1C - Overdue ?Foot Exam - Overdue ?CCM- Unable to reach ?AWV- 8/22 ?Lab Results  ?Component Value Date  ? HGBA1C 6.5 02/21/2021  ? ? ?Star Rating Drugs: ?Pravastatin (Pravachol) 20 mg - Last filled 04/05/21 180 DS at [facility] ?Losartan (Cozaar) 20 mg - Last filled 04/05/21 180 DS at [facility] ? ? ?Ned Clines CMA ?Clinical Pharmacist Assistant ?[phone] ? ?

## 2021-10-26 ENCOUNTER — Telehealth: Payer: Self-pay | Admitting: Pharmacist

## 2021-10-26 NOTE — Chronic Care Management (AMB) (Signed)
Chronic Care Management Pharmacy Assistant   Name: Julie Schmidt  MRN: [id number] DOB: [birth year]  Reason for Encounter: Medication Review   Conditions to be addressed/monitored: HTN  Recent office visits:  None  Recent consult visits:  None  Hospital visits:  None in previous 6 months  Medications: Outpatient Encounter Medications as of 10/26/2021  Medication Sig   Accu-Chek Softclix Lancets lancets Use to check blood sugar daily. Dx:e11.49   aspirin 81 MG tablet Take 81 mg by mouth daily.   Blood Glucose Monitoring Suppl (ACCU-CHEK AVIVA CONNECT) w/Device KIT 1 Device by Does not apply route daily.   Blood Glucose Monitoring Suppl (ACCU-CHEK GUIDE ME) w/Device KIT Use to check blood sugars daily.   Cholecalciferol (VITAMIN D3) 2000 units TABS Take by mouth daily.   Continuous Blood Gluc Receiver (DEXCOM G6 RECEIVER) DEVI Use to check blood sugars up to 3 times daily.   dorzolamide-timolol (COSOPT) 22.3-6.8 MG/ML ophthalmic solution Place 1 drop into both eyes 2 (two) times daily.   DULoxetine (CYMBALTA) 30 MG capsule TAKE ONE CAPSULE BY MOUTH ONCE DAILY   glucose blood (ACCU-CHEK GUIDE) test strip Use to check blood sugars daily. Dx:e11.49   ibuprofen (ADVIL,MOTRIN) 200 MG tablet Take 200 mg by mouth every 6 (six) hours as needed.   Lancets Misc. (ACCU-CHEK SOFTCLIX LANCET DEV) KIT 1 Device by Does not apply route daily.   latanoprost (XALATAN) 0.005 % ophthalmic solution 1 drop at bedtime.   losartan (COZAAR) 50 MG tablet TAKE ONE TABLET BY MOUTH ONCE DAILY   metoprolol succinate (TOPROL-XL) 25 MG 24 hr tablet Take 1 tablet (25 mg total) by mouth daily. Take with or immediately following a meal.   pantoprazole (PROTONIX) 40 MG tablet TAKE ONE TABLET BY MOUTH ONCE DAILY   polyethylene glycol powder (GLYCOLAX/MIRALAX) powder MIX 255 GRAM PO ONCE FOR 1 DOSE   pravastatin (PRAVACHOL) 20 MG tablet Take 1 tablet (20 mg total) by mouth daily.   typhoid (VIVOTIF) DR capsule Take  1 capsule by mouth every other day. For 8 days. Last dose to be completed a week before traveling.   No facility-administered encounter medications on file as of 10/26/2021.  Reviewed chart prior to disease state call. Spoke with patient regarding BP  Recent Office Vitals: BP Readings from Last 3 Encounters:  04/12/21 (!) 162/48  04/04/21 130/80  04/03/21 (!) 160/80   Pulse Readings from Last 3 Encounters:  04/12/21 65  04/04/21 87  04/03/21 96    Wt Readings from Last 3 Encounters:  04/12/21 133 lb (60.3 kg)  04/04/21 133 lb 8 oz (60.6 kg)  04/03/21 135 lb 3.2 oz (61.3 kg)     Kidney Function Lab Results  Component Value Date/Time   CREATININE 0.75 04/04/2021 09:18 AM   CREATININE 0.84 02/27/2021 10:45 PM   GFR 72.09 09/13/2020 04:28 PM   GFRNONAA >60 02/27/2021 10:45 PM   GFRAA >60 06/21/2016 05:30 PM       Latest Ref Rng & Units 04/04/2021    9:18 AM 02/27/2021   10:45 PM 02/27/2021    2:35 PM  BMP  Glucose 70 - 99 mg/dL 101   192   120    BUN 8 - 27 mg/dL '10   12   15    ' Creatinine 0.57 - 1.00 mg/dL 0.75   0.84   0.84    BUN/Creat Ratio 12 - 28 13      Sodium 134 - 144 mmol/L 142   139  138    Potassium 3.5 - 5.2 mmol/L 4.5   3.5   5.8    Chloride 96 - 106 mmol/L 103   105   105    CO2 20 - 29 mmol/L '25   25   22    ' Calcium 8.7 - 10.3 mg/dL 9.3   9.1   9.3      Current antihypertensive regimen:  losartan 50 mg daily metoprolol succinate 25 mg daily   Adherence Review: Is the patient currently on ACE/ARB medication? Yes Does the patient have >5 day gap between last estimated fill dates? Yes    Patient obtains medications through [facility]   last delivery was 6 months supply     Last adherence delivery included:  Pravastatin (PRAVACHOL) 20 MG : take one tablet at Breakfast Duloxetine (Cymbalta) 30 MG: take one tablet at Breakfast Pantoprazole (PROTONIX) 40 MG: take one tablet Before Breakfast  Losartan (COZAAR) 37.5 MG : take one tablet at  Breakfast Dorzolamide-timolol (COSOPT) 22.3-6.8 MG/ML : one drop in each eye twice daily Metoprolol succinate 25 mg: take one tablet daily   Patient also requested fills for her Latanoprost eye drops added to form   Patient is due for next adherence delivery on: 10/09/21. Called patient and reviewed medications and coordinated delivery. Vials for 90 DS   This delivery to include: Losartan (COZAAR) 37.5 MG : take one tablet at Breakfast Duloxetine (Cymbalta) 30 MG: take one tablet at Breakfast Pantoprazole (PROTONIX) 40 MG: take one tablet Before Breakfast Latanoprost (Xalatan) 0.005% Drops : One drop in each eye at bedtime Dorzolamide-timolol (COSOPT) 22.3-6.8 MG/ML : one drop in each eye twice daily Metoprolol succinate 25 mg: take one tablet daily Pravastatin (PRAVACHOL) 20 MG : take one tablet at Breakfast     Unable to reach to confirm delivery date  Care Gaps: BP- 148/70 ( 04/12/21) DEXA - Overdue Zoster Vaccine - Overdue COVID Booster - Overdue HGB A1C - Overdue Foot Exam - Overdue CCM- Unable to reach AWV- 8/22 Lab Results  Component Value Date   HGBA1C 6.5 02/21/2021    Star Rating Drugs: Pravastatin (Pravachol) 20 mg - Last filled 04/05/21 180 DS at [facility] Losartan (Cozaar) 20 mg - Last filled 04/05/21 180 DS at [facility] Pharmacist Assistant [phone]

## 2021-11-27 ENCOUNTER — Telehealth: Payer: Self-pay | Admitting: Pharmacist

## 2021-11-27 NOTE — Chronic Care Management (AMB) (Signed)
Chronic Care Management Pharmacy Assistant   Name: Julie Schmidt  MRN: [id number] DOB: 05-13-1941  Reason for Encounter: Medication Review/Medication Coordination   Conditions to be addressed/monitored: HTN  Recent office visits:  None  Recent consult visits:  None  Hospital visits:  None in previous 6 months  Medications: Outpatient Encounter Medications as of 11/27/2021  Medication Sig   Accu-Chek Softclix Lancets lancets Use to check blood sugar daily. Dx:e11.49   aspirin 81 MG tablet Take 81 mg by mouth daily.   Blood Glucose Monitoring Suppl (ACCU-CHEK AVIVA CONNECT) w/Device KIT 1 Device by Does not apply route daily.   Blood Glucose Monitoring Suppl (ACCU-CHEK GUIDE ME) w/Device KIT Use to check blood sugars daily.   Cholecalciferol (VITAMIN D3) 2000 units TABS Take by mouth daily.   Continuous Blood Gluc Receiver (DEXCOM G6 RECEIVER) DEVI Use to check blood sugars up to 3 times daily.   dorzolamide-timolol (COSOPT) 22.3-6.8 MG/ML ophthalmic solution Place 1 drop into both eyes 2 (two) times daily.   DULoxetine (CYMBALTA) 30 MG capsule TAKE ONE CAPSULE BY MOUTH ONCE DAILY   glucose blood (ACCU-CHEK GUIDE) test strip Use to check blood sugars daily. Dx:e11.49   ibuprofen (ADVIL,MOTRIN) 200 MG tablet Take 200 mg by mouth every 6 (six) hours as needed.   Lancets Misc. (ACCU-CHEK SOFTCLIX LANCET DEV) KIT 1 Device by Does not apply route daily.   latanoprost (XALATAN) 0.005 % ophthalmic solution 1 drop at bedtime.   losartan (COZAAR) 50 MG tablet TAKE ONE TABLET BY MOUTH ONCE DAILY   metoprolol succinate (TOPROL-XL) 25 MG 24 hr tablet Take 1 tablet (25 mg total) by mouth daily. Take with or immediately following a meal.   pantoprazole (PROTONIX) 40 MG tablet TAKE ONE TABLET BY MOUTH ONCE DAILY   polyethylene glycol powder (GLYCOLAX/MIRALAX) powder MIX 255 GRAM PO ONCE FOR 1 DOSE   pravastatin (PRAVACHOL) 20 MG tablet Take 1 tablet (20 mg total) by mouth daily.   typhoid  (VIVOTIF) DR capsule Take 1 capsule by mouth every other day. For 8 days. Last dose to be completed a week before traveling.   No facility-administered encounter medications on file as of 11/27/2021.  Reviewed chart prior to disease state call. Spoke with patient regarding BP  Recent Office Vitals: BP Readings from Last 3 Encounters:  04/12/21 (!) 162/48  04/04/21 130/80  04/03/21 (!) 160/80   Pulse Readings from Last 3 Encounters:  04/12/21 65  04/04/21 87  04/03/21 96    Wt Readings from Last 3 Encounters:  04/12/21 133 lb (60.3 kg)  04/04/21 133 lb 8 oz (60.6 kg)  04/03/21 135 lb 3.2 oz (61.3 kg)     Kidney Function Lab Results  Component Value Date/Time   CREATININE 0.75 04/04/2021 09:18 AM   CREATININE 0.84 02/27/2021 10:45 PM   GFR 72.09 09/13/2020 04:28 PM   GFRNONAA >60 02/27/2021 10:45 PM   GFRAA >60 06/21/2016 05:30 PM       Latest Ref Rng & Units 04/04/2021    9:18 AM 02/27/2021   10:45 PM 02/27/2021    2:35 PM  BMP  Glucose 70 - 99 mg/dL 101  192  120   BUN 8 - 27 mg/dL '10  12  15   ' Creatinine 0.57 - 1.00 mg/dL 0.75  0.84  0.84   BUN/Creat Ratio 12 - 28 13     Sodium 134 - 144 mmol/L 142  139  138   Potassium 3.5 - 5.2 mmol/L 4.5  3.5  5.8   Chloride 96 - 106 mmol/L 103  105  105   CO2 20 - 29 mmol/L '25  25  22   ' Calcium 8.7 - 10.3 mg/dL 9.3  9.1  9.3     Current antihypertensive regimen:  losartan 50 mg daily metoprolol succinate 25 mg daily     Adherence Review: Is the patient currently on ACE/ARB medication? Yes Does the patient have >5 day gap between last estimated fill dates? Yes     Patient obtains medications through [facility]   last delivery was 6 months supply     Last adherence delivery included:  Pravastatin (PRAVACHOL) 20 MG : take one tablet at Breakfast Duloxetine (Cymbalta) 30 MG: take one tablet at Breakfast Pantoprazole (PROTONIX) 40 MG: take one tablet Before Breakfast  Losartan (COZAAR) 37.5 MG : take one tablet at  Breakfast Dorzolamide-timolol (COSOPT) 22.3-6.8 MG/ML : one drop in each eye twice daily Metoprolol succinate 25 mg: take one tablet daily   Patient also requested fills for her Latanoprost eye drops added to form   Patient is due for next adherence delivery on: 10/09/21. Called patient and reviewed medications and coordinated delivery. Vials for 90 DS   This delivery to include: Losartan (COZAAR) 37.5 MG : take one tablet at Breakfast Duloxetine (Cymbalta) 30 MG: take one tablet at Breakfast Pantoprazole (PROTONIX) 40 MG: take one tablet Before Breakfast Latanoprost (Xalatan) 0.005% Drops : One drop in each eye at bedtime Dorzolamide-timolol (COSOPT) 22.3-6.8 MG/ML : one drop in each eye twice daily Metoprolol succinate 25 mg: take one tablet daily Pravastatin (PRAVACHOL) 20 MG : take one tablet at Breakfast     Unable to reach to confirm delivery date  Care Gaps: BP- 148/70 ( 04/12/21) DEXA - Overdue Zoster Vaccine - Overdue COVID Booster - Overdue HGB A1C - Overdue Foot Exam - Overdue CCM- Unable to reach AWV- 8/22 Lab Results  Component Value Date   HGBA1C 6.5 02/21/2021    Star Rating Drugs: Pravastatin (Pravachol) 20 mg - Last filled 04/05/21 180 DS at [facility] Losartan (Cozaar) 20 mg - Last filled 04/05/21 180 DS at [facility] Pharmacist Assistant [phone]

## 2022-01-08 ENCOUNTER — Telehealth: Payer: Self-pay | Admitting: Family Medicine

## 2022-01-08 NOTE — Telephone Encounter (Signed)
Tried calling patient to schedule Medicare Annual Wellness Visit (AWV) either virtually or in office. Left  my jabber number [phone]  No answer   Last AWV ;01/08/21  please schedule at anytime with [facility] Nurse Health Advisor 1 or 2

## 2022-02-20 ENCOUNTER — Telehealth: Payer: Self-pay | Admitting: Family Medicine

## 2022-02-20 NOTE — Telephone Encounter (Signed)
Left message for patient to call back and schedule Medicare Annual Wellness Visit (AWV).   Please offer to do virtually or by telephone.   Last AWV: 01/08/2021  Please schedule at anytime with [facility] Advisor schedule   45 minute appointment for AWVI or in office 30 minute appointment for AWVS  If any questions, please contact me at [phone]

## 2022-03-05 ENCOUNTER — Telehealth: Payer: Self-pay | Admitting: Family Medicine

## 2022-03-05 NOTE — Telephone Encounter (Signed)
Tried calling patient to schedule AWV  No answer  phone kept hanging up  Last awv 01/08/21

## 2022-03-17 IMAGING — CT CT ANGIO CHEST-ABD-PELV FOR DISSECTION W/ AND WO/W CM
2 of 7 series · 13 of 46 positions shown, 15 images · non-contrast
Comparison: CT chest 06/22/2016.  CT abdomen and pelvis 05/30/2005

CLINICAL DATA: Abdominal pain.  Aortic dissection is suspected.

EXAM:
CT ANGIOGRAPHY CHEST, ABDOMEN AND PELVIS
TECHNIQUE: Non-contrast CT of the chest was initially obtained.

[Series 8: dissection 2mm · axial · 0.70mm/px · z∈[-994,-490]mm · 10 of 286 slices shown, 12 images]
[im 17/286  soft-tissue]
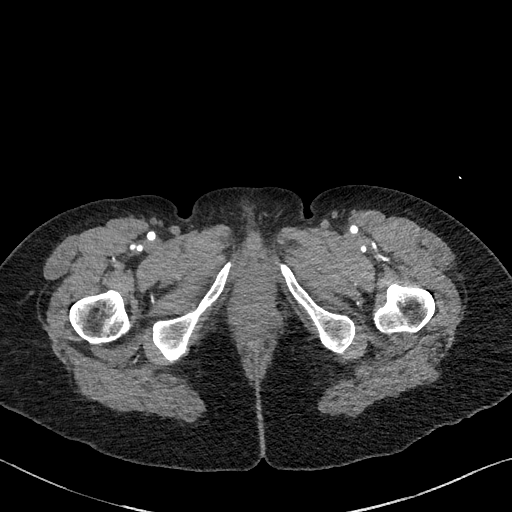
[im 17/286  bone]
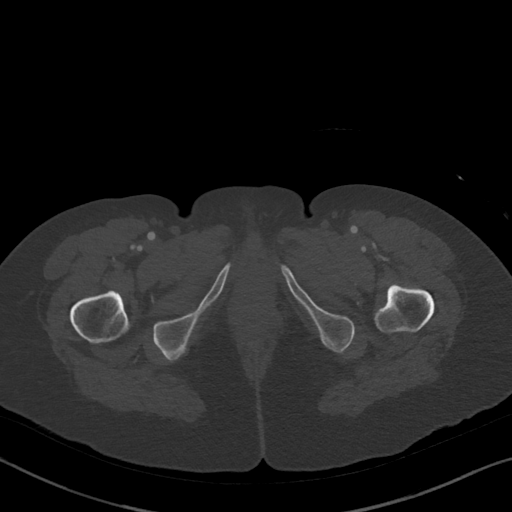
[im 51/286  soft-tissue]
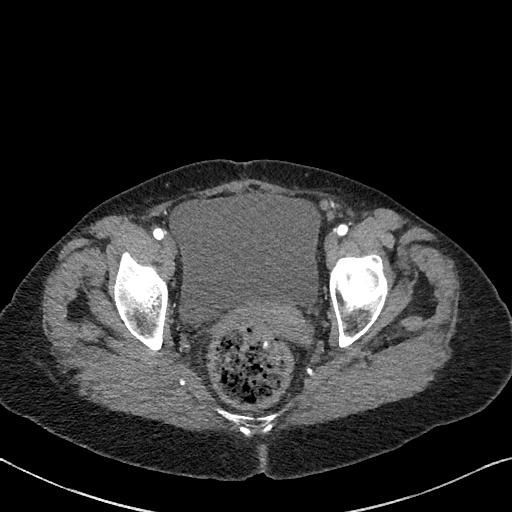
[im 84/286  soft-tissue]
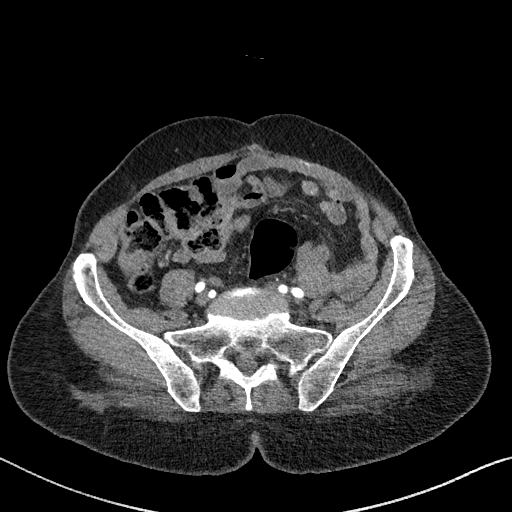
[im 101/286  soft-tissue]
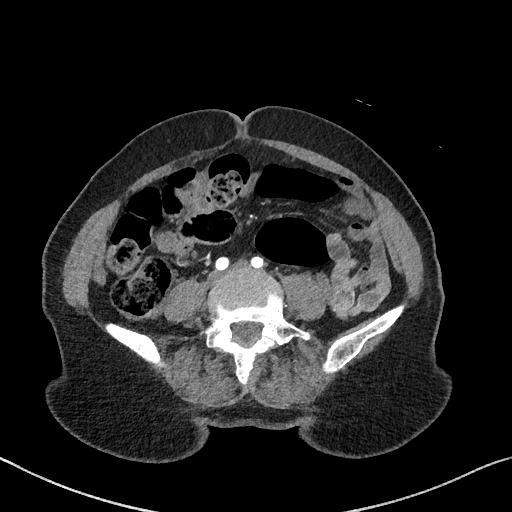
[im 135/286  soft-tissue]
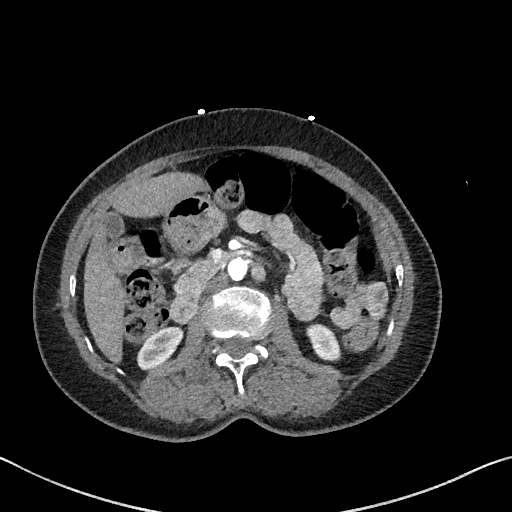
[im 151/286  soft-tissue]
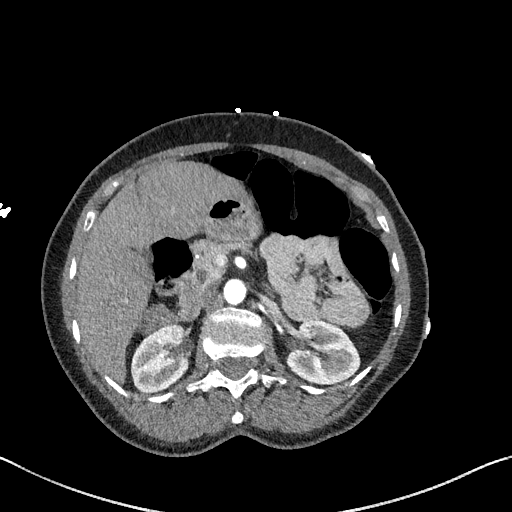
[im 185/286  soft-tissue]
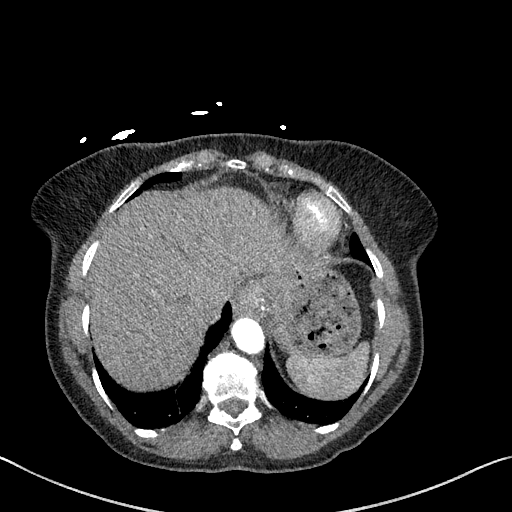
[im 218/286  soft-tissue]
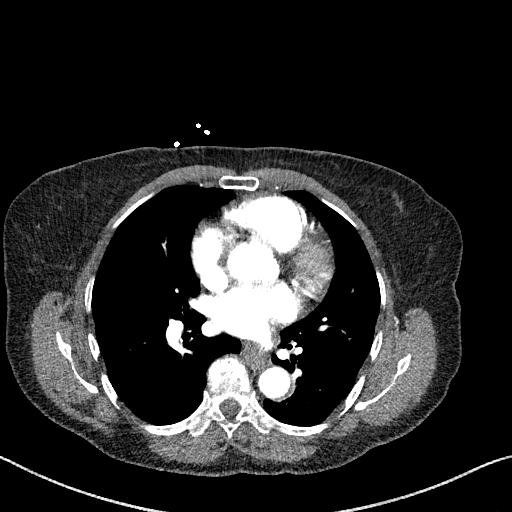
[im 235/286  soft-tissue]
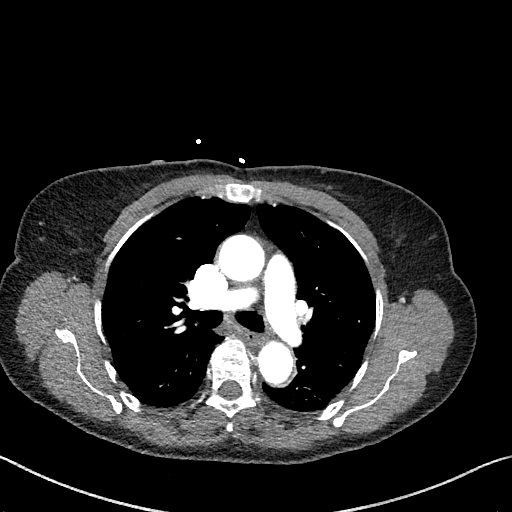
[im 235/286  bone]
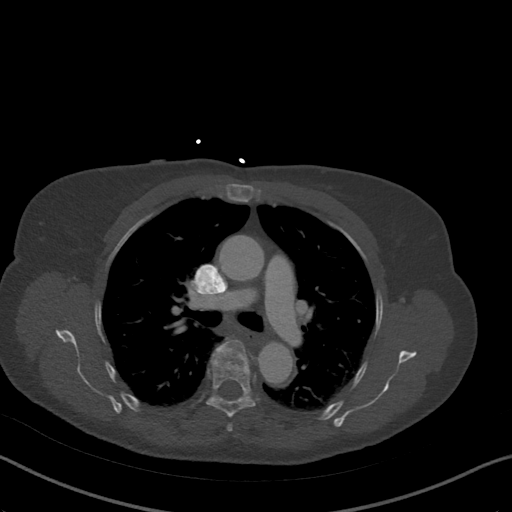
[im 269/286  soft-tissue]
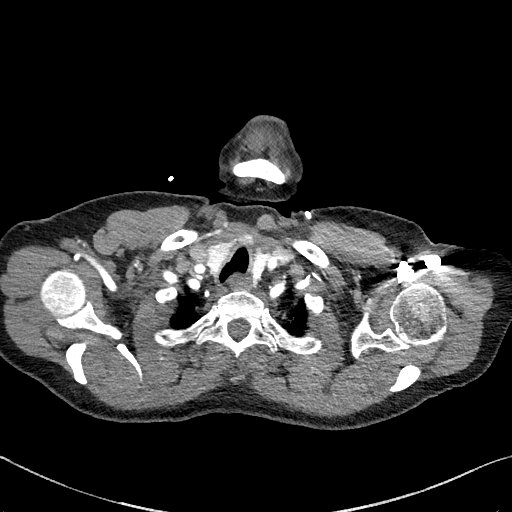

[Series 11: dissection 2mm cor · coronal · 0.68mm/px · 3 of 119 slices shown]
[im 30/119  soft-tissue]
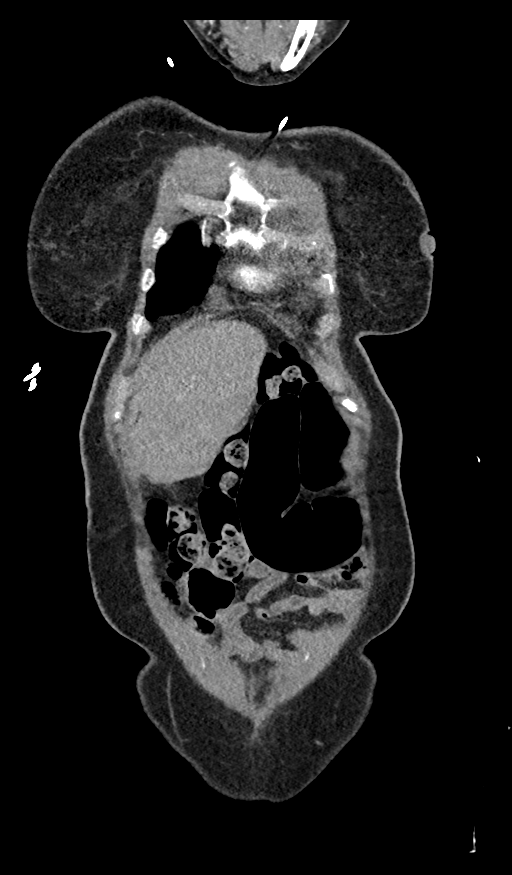
[im 60/119  soft-tissue]
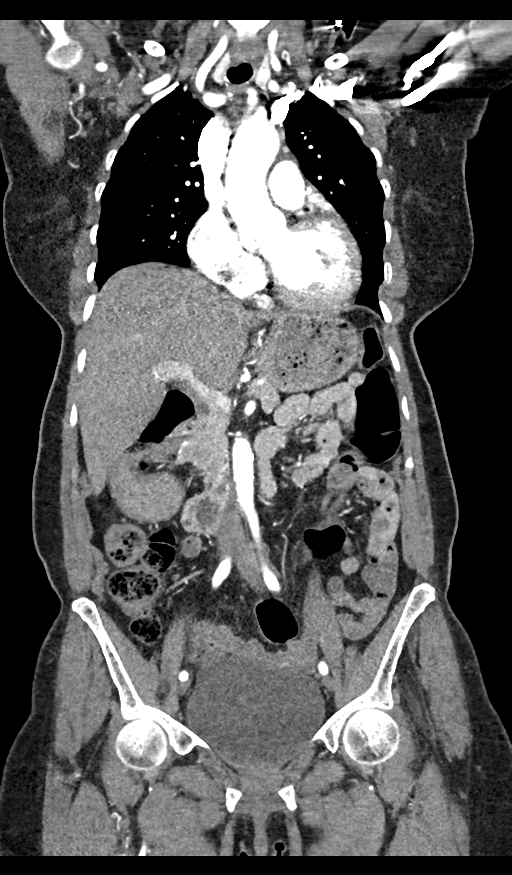
[im 89/119  soft-tissue]
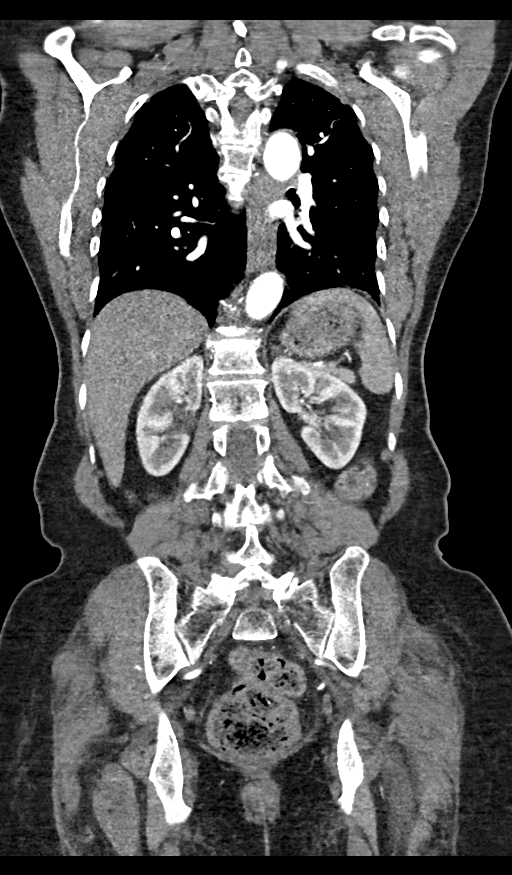

[13 of 46 positions shown; findings below may reference images not displayed]

Multidetector CT imaging through the chest, abdomen and pelvis was
performed using the standard protocol during bolus administration of
intravenous contrast. Multiplanar reconstructed images and MIPs were
obtained and reviewed to evaluate the vascular anatomy.

CONTRAST:  100mL OMNIPAQUE IOHEXOL 350 MG/ML SOLN
FINDINGS: CTA CHEST FINDINGS

Cardiovascular: Noncontrast images of the chest demonstrate
scattered calcifications in the aorta and coronary arteries. No
evidence of intramural hematoma. Surgical clips at the EG junction.

Images obtained during the arterial phase after intravenous contrast
administration demonstrate normal caliber thoracic aorta. Mild
intimal thickening or mural thrombus in the descending aorta. No
aortic aneurysm or dissection. Great vessel origins are patent.
Central pulmonary arteries are patent without evidence of
significant pulmonary embolus. Normal heart size. No pericardial
effusions.

Mediastinum/Nodes: Esophagus is decompressed. No significant
lymphadenopathy in the chest. Thyroid gland is unremarkable.

Lungs/Pleura: Slight fibrosis in the lung bases. Lungs are otherwise
clear. No airspace disease or consolidation. No pleural effusions.
No pneumothorax.

Musculoskeletal: Degenerative changes in the spine. No destructive
bone lesions.

Review of the MIP images confirms the above findings.

CTA ABDOMEN AND PELVIS FINDINGS

VASCULAR

Aorta: Scattered aortic calcification.  No aneurysm or dissection.

Celiac: Patent without evidence of aneurysm, dissection, vasculitis
or significant stenosis.

SMA: Patent without evidence of aneurysm, dissection, vasculitis or
significant stenosis.

Renals: Duplicated renal arteries bilaterally are patent. No
aneurysm or dissection. Nephrograms are symmetrical.

IMA: Patent without evidence of aneurysm, dissection, vasculitis or
significant stenosis.

Inflow: Patent without evidence of aneurysm, dissection, vasculitis
or significant stenosis.

Veins: No obvious venous abnormality within the limitations of this
arterial phase study.

Review of the MIP images confirms the above findings.

NON-VASCULAR

Hepatobiliary: Mild diffuse fatty infiltration of the liver. No
focal lesions. Gallbladder and bile ducts are unremarkable.

Pancreas: Unremarkable. No pancreatic ductal dilatation or
surrounding inflammatory changes.

Spleen: Normal in size without focal abnormality.

Adrenals/Urinary Tract: Adrenal glands are unremarkable. Kidneys are
normal, without renal calculi, focal lesion, or hydronephrosis.
Bladder is unremarkable.

Stomach/Bowel: Stomach, small bowel, and colon are not abnormally
distended. Scattered stool throughout the colon. No wall thickening
or inflammatory changes. Appendix is not identified.

Lymphatic: No significant lymphadenopathy.

Reproductive: Uterus and bilateral adnexa are unremarkable.

Other: No free air or free fluid in the abdomen. Abdominal wall
musculature appears intact.

Musculoskeletal: Degenerative changes in the spine. No destructive
bone lesions.

Review of the MIP images confirms the above findings.
IMPRESSION: 1. Mild aortic atherosclerosis. No evidence of thoracic or abdominal
aortic aneurysm or dissection.
2. No evidence of active pulmonary disease.
3. Fatty infiltration of the liver.
4. No evidence of bowel obstruction or inflammation.

## 2022-04-02 ENCOUNTER — Telehealth: Payer: Self-pay | Admitting: Family Medicine

## 2022-04-02 NOTE — Telephone Encounter (Signed)
Tried calling patient to schedule Medicare Annual Wellness Visit (AWV) either virtually or in office.   No answer  Last AWV  01/08/21 please schedule with Nurse Health Adviser   45 min for awv-i and in office appointments 30 min for awv-s  phone/virtual appointments  

## 2022-05-20 ENCOUNTER — Telehealth: Payer: Self-pay | Admitting: Family Medicine

## 2022-05-20 NOTE — Telephone Encounter (Signed)
Tried calling patient to schedule Medicare Annual Wellness Visit (AWV) either virtually or in office.   No answer  Last AWV  01/08/21 please schedule with Nurse Health Adviser   45 min for awv-i and in office appointments 30 min for awv-s  phone/virtual appointments

## 2022-11-29 NOTE — Progress Notes (Signed)
HPI: Julie Schmidt is a 82 y.o. female with PMHx significant for DM II,PAD,peripheral neuropathy, PVC's, grade II diastolic dysfunction,HLD,and HTN here today with her daughter for her routine physical and follow up.  Last CPE: 05/2019 She was last seen on 04/04/21.  Her exercise routine consists of walking daily, and she maintains a healthy diet by cooking at home and consuming vegetables daily.  She sleeps for an average of six hours per night and does not smoke or drink alcohol.   Immunization History  Administered Date(s) Administered   Fluad Quad(high Dose 65+) 06/08/2019, 02/16/2020, 02/21/2021   Hepatitis A, Adult 04/03/2018   Influenza, High Dose Seasonal PF 04/23/2016   Influenza-Unspecified 03/29/2014   PFIZER(Purple Top)SARS-COV-2 Vaccination 06/28/2019, 07/19/2019, 03/03/2020   Pneumococcal Conjugate-13 01/30/2015, 03/13/2018   Pneumococcal Polysaccharide-23 06/07/2016   Tdap 04/02/2021   Health Maintenance  Topic Date Due   DEXA SCAN  Never done   HEMOGLOBIN A1C  08/21/2021   OPHTHALMOLOGY EXAM  01/01/2022   Diabetic kidney evaluation - Urine ACR  01/15/2022   Diabetic kidney evaluation - eGFR measurement  04/04/2022   COVID-19 Vaccine (4 - 2023-24 season) 12/18/2022 (Originally 02/08/2022)   Medicare Annual Wellness (AWV)  01/01/2023 (Originally 01/08/2022)   Zoster Vaccines- Shingrix (1 of 2) 06/13/2023 (Originally 03/03/1993)   INFLUENZA VACCINE  01/09/2023   FOOT EXAM  12/02/2023   DTaP/Tdap/Td (2 - Td or Tdap) 04/03/2031   Pneumonia Vaccine 62+ Years old  Completed   Hepatitis C Screening  Completed   HPV VACCINES  Aged Out   Diabetes Mellitus II: Dx'ed 5+ years ago. She is not checking BS's. She is on non pharmacologic treatment. Eye exam: > a year ago. Foot exam: Over a year ago. Negative for symptoms of hypoglycemia, polyuria, polydipsia,foot ulcers/trauma + Burning feet sensation. She is not taking Cymbalta 30 mg for a few months, medication  helped.  Lab Results  Component Value Date   HGBA1C 6.5 02/21/2021   Lab Results  Component Value Date   MICROALBUR <0.7 01/15/2021   Lab Results  Component Value Date   CHOL 158 04/04/2021   HDL 51 04/04/2021   LDLCALC 93 04/04/2021   TRIG 71 04/04/2021   CHOLHDL 3.1 04/04/2021   She is not longer on Losartan. She has not checked BP regularly.  Lab Results  Component Value Date   CREATININE 0.75 04/04/2021   BUN 10 04/04/2021   NA 142 04/04/2021   K 4.5 04/04/2021   CL 103 04/04/2021   CO2 25 04/04/2021   Additionally, she reports a new onset of pain in the right lower quadrant for the past five days. She describes the pain as constant, with a pain level of 5/10, radiating from the front to the back, and exacerbated by movement.  She denies any associated nausea, vomiting, fever, chills, or changes in appetite. She took Alka-Seltzer yesterday, but it did not alleviate the pain. She reports no change in bowel habits or blood in the urine, and her last bowel movement was yesterday with no difference in pain after the bowel movement. She returned from [place] a week ago, where she stayed since 04/2021. No known sick contact.  Review of Systems  Constitutional:  Negative for activity change, appetite change and fever.  HENT:  Negative for hearing loss, mouth sores, sore throat and trouble swallowing.   Eyes:  Negative for redness and visual disturbance.  Respiratory:  Negative for cough, shortness of breath and wheezing.   Cardiovascular:  Negative for  chest pain and leg swelling.  Gastrointestinal:  Positive for abdominal pain. Negative for nausea and vomiting.  Endocrine: Negative for cold intolerance, heat intolerance, polydipsia, polyphagia and polyuria.  Genitourinary:  Negative for decreased urine volume, dysuria, hematuria, vaginal bleeding and vaginal discharge.  Musculoskeletal:  Positive for arthralgias. Negative for gait problem and myalgias.  Skin:  Negative for  color change and rash.  Allergic/Immunologic: Negative for environmental allergies.  Neurological:  Negative for syncope, weakness and headaches.  Hematological:  Negative for adenopathy. Does not bruise/bleed easily.  Psychiatric/Behavioral:  Negative for confusion and sleep disturbance. The patient is not nervous/anxious.   All other systems reviewed and are negative.  Current Outpatient Medications on File Prior to Visit  Medication Sig Dispense Refill   Accu-Chek Softclix Lancets lancets Use to check blood sugar daily. Dx:e11.49 100 each 12   aspirin 81 MG tablet Take 81 mg by mouth daily.     Blood Glucose Monitoring Suppl (ACCU-CHEK AVIVA CONNECT) w/Device KIT 1 Device by Does not apply route daily. 1 kit 0   Blood Glucose Monitoring Suppl (ACCU-CHEK GUIDE ME) w/Device KIT Use to check blood sugars daily. 1 kit 0   Cholecalciferol (VITAMIN D3) 2000 units TABS Take by mouth daily.     Continuous Blood Gluc Receiver (DEXCOM G6 RECEIVER) DEVI Use to check blood sugars up to 3 times daily. 1 each 1   dorzolamide-timolol (COSOPT) 22.3-6.8 MG/ML ophthalmic solution Place 1 drop into both eyes 2 (two) times daily.     glucose blood (ACCU-CHEK GUIDE) test strip Use to check blood sugars daily. Dx:e11.49 100 each 12   ibuprofen (ADVIL,MOTRIN) 200 MG tablet Take 200 mg by mouth every 6 (six) hours as needed.     Lancets Misc. (ACCU-CHEK SOFTCLIX LANCET DEV) KIT 1 Device by Does not apply route daily. 100 kit 3   latanoprost (XALATAN) 0.005 % ophthalmic solution 1 drop at bedtime.     polyethylene glycol powder (GLYCOLAX/MIRALAX) powder MIX 255 GRAM PO ONCE FOR 1 DOSE  0   No current facility-administered medications on file prior to visit.   Past Medical History:  Diagnosis Date   Arthritis    knees, back   Diabetes mellitus    Headache    Hypertension    Past Surgical History:  Procedure Laterality Date   ABDOMINAL HYSTERECTOMY     APPENDECTOMY     BRAIN SURGERY  05/19/2014    Craniotomy for meningioma   CARDIAC CATHETERIZATION N/A 11/25/2014   Procedure: Left Heart Cath and Coronary Angiography;  Surgeon: Jake Bathe, MD;  Location: [facility];  Service: Cardiovascular;  Laterality: N/A;   CESAREAN SECTION     CRANIOTOMY Left 05/19/2014   Procedure: CRANIOTOMY TUMOR EXCISION with Curve;  Surgeon: Maeola Harman, MD;  Location: [facility];  Service: Neurosurgery;  Laterality: Left;  Left frontal craniotomy for meningioma with brain lab    Allergies  Allergen Reactions   Norvasc [Amlodipine] Other (See Comments)    Dizziness and Chest pains at night when lies down   Oxycodone-Acetaminophen Nausea And Vomiting    Family History  Problem Relation Age of Onset   Cancer Father    Social History   Socioeconomic History   Marital status: Widowed    Spouse name: Not on file   Number of children: Not on file   Years of education: Not on file   Highest education level: Not on file  Occupational History   Not on file  Tobacco Use  Smoking status: Never   Smokeless tobacco: Never  Substance and Sexual Activity   Alcohol use: No    Alcohol/week: 0.0 standard drinks of alcohol   Drug use: No   Sexual activity: Not on file  Other Topics Concern   Not on file  Social History Narrative   Not on file   Social Determinants of Health   Financial Resource Strain: Low Risk  (01/08/2021)   Overall Financial Resource Strain (CARDIA)    Difficulty of Paying Living Expenses: Not hard at all  Food Insecurity: No Food Insecurity (01/08/2021)   Hunger Vital Sign    Worried About Running Out of Food in the Last Year: Never true    Ran Out of Food in the Last Year: Never true  Transportation Needs: No Transportation Needs (01/08/2021)   PRAPARE - Administrator, Civil Service (Medical): No    Lack of Transportation (Non-Medical): No  Physical Activity: Insufficiently Active (01/08/2021)   Exercise Vital Sign    Days of Exercise per Week: 5 days     Minutes of Exercise per Session: 10 min  Stress: No Stress Concern Present (01/08/2021)   [employer] of Occupational Health - Occupational Stress Questionnaire    Feeling of Stress : Not at all  Social Connections: Moderately Isolated (01/08/2021)   Social Connection and Isolation Panel [NHANES]    Frequency of Communication with Friends and Family: Twice a week    Frequency of Social Gatherings with Friends and Family: Twice a week    Attends Religious Services: 1 to 4 times per year    Active Member of Clubs or Organizations: No    Attends Banker Meetings: Never    Marital Status: Never married   Vitals:   12/02/22 0846  BP: 120/70  Pulse: 60  Resp: 16  Temp: 98.5 F (36.9 C)  SpO2: 98%   Body mass index is 22.56 kg/m.  Wt Readings from Last 3 Encounters:  12/02/22 119 lb 6 oz (54.1 kg)  04/12/21 133 lb (60.3 kg)  04/04/21 133 lb 8 oz (60.6 kg)   Physical Exam Vitals and nursing note reviewed.  Constitutional:      General: She is not in acute distress.    Appearance: She is well-developed.  HENT:     Head: Normocephalic and atraumatic.     Right Ear: Hearing, tympanic membrane, ear canal and external ear normal.     Left Ear: Hearing, tympanic membrane, ear canal and external ear normal.     Mouth/Throat:     Mouth: Mucous membranes are moist.     Pharynx: Oropharynx is clear. Uvula midline.  Eyes:     Conjunctiva/sclera: Conjunctivae normal.     Pupils: Pupils are equal, round, and reactive to light.  Neck:     Thyroid: No thyromegaly.     Trachea: No tracheal deviation.  Cardiovascular:     Rate and Rhythm: Normal rate. Rhythm irregular. Occasional Extrasystoles are present.    Pulses:          Dorsalis pedis pulses are 2+ on the right side and 2+ on the left side.     Heart sounds: Murmur (SEM I/VI LUSB) heard.     Comments: Trace pitting LE edema, bilateral. Pulmonary:     Effort: Pulmonary effort is normal. No respiratory distress.      Breath sounds: Normal breath sounds.  Abdominal:     Palpations: Abdomen is soft. There is no hepatomegaly or mass.  Tenderness: There is abdominal tenderness in the right lower quadrant and suprapubic area. There is no guarding or rebound.  Musculoskeletal:     Comments: No signs of synovitis appreciated.  Lymphadenopathy:     Cervical: No cervical adenopathy.  Skin:    General: Skin is warm.     Findings: No erythema or rash.  Neurological:     General: No focal deficit present.     Mental Status: She is alert and oriented to person, place, and time.     Cranial Nerves: No cranial nerve deficit.     Coordination: Coordination normal.     Gait: Gait normal.     Deep Tendon Reflexes:     Reflex Scores:      Bicep reflexes are 2+ on the right side and 2+ on the left side.      Patellar reflexes are 2+ on the right side and 2+ on the left side. Psychiatric:        Mood and Affect: Mood and affect normal.    Diabetic Foot Exam - Simple   Simple Foot Form Diabetic Foot exam was performed with the following findings: Yes 12/02/2022  9:33 AM  Visual Inspection No deformities, no ulcerations, no other skin breakdown bilaterally: Yes Sensation Testing Intact to touch and monofilament testing bilaterally: Yes Pulse Check Posterior Tibialis and Dorsalis pulse intact bilaterally: Yes Comments    ASSESSMENT AND PLAN: Ms. Julie Schmidt was here today annual physical examination, follow up,and abdominal pain.  Orders Placed This Encounter  Procedures   DG Bone Density   Basic metabolic panel   Hepatic function panel   Hemoglobin A1c   Microalbumin / creatinine urine ratio   Lipid panel   CBC with Differential/Platelet   Urinalysis with Culture Reflex   Lab Results  Component Value Date   WBC 4.6 12/02/2022   HGB 11.2 (L) 12/02/2022   HCT 34.7 (L) 12/02/2022   MCV 92.9 12/02/2022   PLT 337.0 12/02/2022   Lab Results  Component Value Date   ALT 33 12/02/2022   AST 31  12/02/2022   ALKPHOS 103 12/02/2022   BILITOT 0.3 12/02/2022   Lab Results  Component Value Date   CHOL 183 12/02/2022   HDL 47.10 12/02/2022   LDLCALC 113 (H) 12/02/2022   TRIG 113.0 12/02/2022   CHOLHDL 4 12/02/2022   Lab Results  Component Value Date   HGBA1C 6.1 12/02/2022   Lab Results  Component Value Date   MICROALBUR 1.1 12/02/2022   MICROALBUR <0.7 01/15/2021   Routine general medical examination at a health care facility Assessment & Plan: We discussed the importance of regular physical activity and healthy diet for prevention of chronic illness and/or complications. Preventive guidelines reviewed. Vaccination: She can get shingrix at her pharmacy. Ca++ and vit D supplementation recommended. Fall precautions discussed. Next CPE in a year.  PAD (peripheral artery disease) (HCC) Assessment & Plan: ABI 02/2020 with right toe-brachial index abnormal, rest normal. She takes Aspirin 81 mg daily. Today we are resuming Pravastatin 20 mg daily.  Type 2 diabetes mellitus with neurological complications [facility]) Assessment & Plan: Problem has been well controlled with non pharmacologic treatment. Further recommendations according to HgA1C. Annual eye exam and foot care recommended. F/U in 5-6 months  Orders: -     Hemoglobin A1c; Future -     Microalbumin / creatinine urine ratio; Future  Hyperlipidemia associated with type 2 diabetes mellitus (HCC) Assessment & Plan: Resume Pravastatin 20 mg daily and continue  low fat diet. FLP ordered today.  Orders: -     Basic metabolic panel; Future -     Lipid panel; Future -     Pravastatin Sodium; Take 1 tablet (20 mg total) by mouth daily.  Dispense: 90 tablet; Refill: 3  Elevated alkaline phosphatase level Last times in 03/2021 it was 131. It has been elevated in the past. Further recommendations according to LFT's results.  -     Hepatic function panel; Future  Polyneuropathy associated with underlying disease  (HCC) Assessment & Plan: Symptomatic. Resume Duloxetine 30 mg daily. Continue appropriate foot care.  Orders: -     DULoxetine HCl; Take 1 capsule (30 mg total) by mouth daily.  Dispense: 90 capsule; Refill: 2  Meningioma [facility]) Assessment & Plan: Follows with neurologist annually.  Lower abdominal pain We discussed possible etiologies. Difficult to obtain details about pain, initially points to RUQ but on exam pain was elicited with lower abdomina palpation. No signs of acute abdomen. Monitor for new symptoms. Instructed about warning signs.  ? Musculoskeletal,  -     CBC with Differential/Platelet; Future -     Urinalysis w microscopic + reflex cultur; Future -     Urine Culture -     REFLEXIVE URINE CULTURE  Asymptomatic postmenopausal estrogen deficiency -     DG Bone Density; Future  Primary hypertension Assessment & Plan: She is not longer on Losartan. Today BP adequately controlled. Monitor BP at home. Continue non pharmacologic treatment.  Return in 6 months (on 06/03/2023) for chronic problems.  Jorita Bohanon G. [place], MD  [facility]. [place] office.

## 2022-12-02 ENCOUNTER — Encounter: Payer: Self-pay | Admitting: Family Medicine

## 2022-12-02 ENCOUNTER — Ambulatory Visit (INDEPENDENT_AMBULATORY_CARE_PROVIDER_SITE_OTHER): Payer: 59 | Admitting: Family Medicine

## 2022-12-02 VITALS — BP 120/70 | HR 60 | Temp 98.5°F | Resp 16 | Ht 61.0 in | Wt 119.4 lb

## 2022-12-02 DIAGNOSIS — E1149 Type 2 diabetes mellitus with other diabetic neurological complication: Secondary | ICD-10-CM | POA: Diagnosis not present

## 2022-12-02 DIAGNOSIS — R748 Abnormal levels of other serum enzymes: Secondary | ICD-10-CM

## 2022-12-02 DIAGNOSIS — I739 Peripheral vascular disease, unspecified: Secondary | ICD-10-CM | POA: Diagnosis not present

## 2022-12-02 DIAGNOSIS — I10 Essential (primary) hypertension: Secondary | ICD-10-CM

## 2022-12-02 DIAGNOSIS — R1030 Lower abdominal pain, unspecified: Secondary | ICD-10-CM

## 2022-12-02 DIAGNOSIS — Z780 Asymptomatic menopausal state: Secondary | ICD-10-CM | POA: Diagnosis not present

## 2022-12-02 DIAGNOSIS — G63 Polyneuropathy in diseases classified elsewhere: Secondary | ICD-10-CM

## 2022-12-02 DIAGNOSIS — E785 Hyperlipidemia, unspecified: Secondary | ICD-10-CM | POA: Diagnosis not present

## 2022-12-02 DIAGNOSIS — Z0000 Encounter for general adult medical examination without abnormal findings: Secondary | ICD-10-CM | POA: Diagnosis not present

## 2022-12-02 DIAGNOSIS — E1169 Type 2 diabetes mellitus with other specified complication: Secondary | ICD-10-CM

## 2022-12-02 DIAGNOSIS — D329 Benign neoplasm of meninges, unspecified: Secondary | ICD-10-CM

## 2022-12-02 LAB — CBC WITH DIFFERENTIAL/PLATELET
Basophils Absolute: 0 10*3/uL (ref 0.0–0.1)
Basophils Relative: 0.6 % (ref 0.0–3.0)
Eosinophils Absolute: 0.2 10*3/uL (ref 0.0–0.7)
Eosinophils Relative: 3.3 % (ref 0.0–5.0)
HCT: 34.7 % — ABNORMAL LOW (ref 36.0–46.0)
Hemoglobin: 11.2 g/dL — ABNORMAL LOW (ref 12.0–15.0)
Lymphocytes Relative: 28.4 % (ref 12.0–46.0)
Lymphs Abs: 1.3 10*3/uL (ref 0.7–4.0)
MCHC: 32.2 g/dL (ref 30.0–36.0)
MCV: 92.9 fl (ref 78.0–100.0)
Monocytes Absolute: 0.3 10*3/uL (ref 0.1–1.0)
Monocytes Relative: 7.1 % (ref 3.0–12.0)
Neutro Abs: 2.8 10*3/uL (ref 1.4–7.7)
Neutrophils Relative %: 60.6 % (ref 43.0–77.0)
Platelets: 337 10*3/uL (ref 150.0–400.0)
RBC: 3.74 Mil/uL — ABNORMAL LOW (ref 3.87–5.11)
RDW: 13.9 % (ref 11.5–15.5)
WBC: 4.6 10*3/uL (ref 4.0–10.5)

## 2022-12-02 LAB — LIPID PANEL
Cholesterol: 183 mg/dL (ref 0–200)
HDL: 47.1 mg/dL (ref >39.00)
LDL Cholesterol: 113 mg/dL — ABNORMAL HIGH (ref 0–99)
NonHDL: 135.47
Total CHOL/HDL Ratio: 4
Triglycerides: 113 mg/dL (ref 0.0–149.0)
VLDL: 22.6 mg/dL (ref 0.0–40.0)

## 2022-12-02 LAB — HEPATIC FUNCTION PANEL
ALT: 33 U/L (ref 0–35)
AST: 31 U/L (ref 0–37)
Albumin: 4.2 g/dL (ref 3.5–5.2)
Alkaline Phosphatase: 103 U/L (ref 39–117)
Bilirubin, Direct: 0 mg/dL (ref 0.0–0.3)
Total Bilirubin: 0.3 mg/dL (ref 0.2–1.2)
Total Protein: 7.4 g/dL (ref 6.0–8.3)

## 2022-12-02 LAB — BASIC METABOLIC PANEL
BUN: 11 mg/dL (ref 6–23)
CO2: 30 mEq/L (ref 19–32)
Calcium: 9.5 mg/dL (ref 8.4–10.5)
Chloride: 104 mEq/L (ref 96–112)
Creatinine, Ser: 0.72 mg/dL (ref 0.40–1.20)
GFR: 79.34 mL/min (ref >60.00)
Glucose, Bld: 87 mg/dL (ref 70–99)
Potassium: 3.7 mEq/L (ref 3.5–5.1)
Sodium: 142 mEq/L (ref 135–145)

## 2022-12-02 LAB — MICROALBUMIN / CREATININE URINE RATIO
Creatinine,U: 131.4 mg/dL
Microalb Creat Ratio: 0.8 mg/g (ref 0.0–30.0)
Microalb, Ur: 1.1 mg/dL (ref 0.0–1.9)

## 2022-12-02 LAB — HEMOGLOBIN A1C: Hgb A1c MFr Bld: 6.1 % (ref 4.6–6.5)

## 2022-12-02 MED ORDER — PRAVASTATIN SODIUM 20 MG PO TABS
20.0000 mg | ORAL_TABLET | Freq: Every day | ORAL | 3 refills | Status: AC
Start: 2022-12-02 — End: ?

## 2022-12-02 MED ORDER — DULOXETINE HCL 30 MG PO CPEP
30.0000 mg | ORAL_CAPSULE | Freq: Every day | ORAL | 2 refills | Status: DC
Start: 2022-12-02 — End: 2023-02-12

## 2022-12-02 NOTE — Patient Instructions (Addendum)
A few things to remember from today's visit:  PAD (peripheral artery disease) (HCC)  Type 2 diabetes mellitus with neurological complications (HCC) - Plan: Hemoglobin A1c, Microalbumin / creatinine urine ratio  Hyperlipidemia associated with type 2 diabetes mellitus (HCC) - Plan: Basic metabolic panel, Lipid panel, pravastatin (PRAVACHOL) 20 MG tablet  Elevated alkaline phosphatase level - Plan: Hepatic function panel  Routine general medical examination at a health care facility  Polyneuropathy associated with underlying disease (HCC), Chronic - Plan: DULoxetine (CYMBALTA) 30 MG capsule  Meningioma (HCC), Chronic  Lower abdominal pain - Plan: CBC with Differential/Platelet, Urinalysis with Culture Reflex, CANCELED: Urinalysis with Culture Reflex  Asymptomatic postmenopausal estrogen deficiency - Plan: DG Bone Density  Adequate hydration and monitor for new symptoms for abdominal pain. If suddenly worsening pain or fever or committing seek immediate medical attention. If persistent we may need abdominal imaging.Let me know if still present in 2-3 weeks.  Resume Pravastatin and duloxetine.  If you need refills for medications you take chronically, please call your pharmacy. Do not use My Chart to request refills or for acute issues that need immediate attention. If you send a my chart message, it may take a few days to be addressed, specially if I am not in the office.  Please be sure medication list is accurate. If a new problem present, please set up appointment sooner than planned today.

## 2022-12-03 LAB — URINALYSIS W MICROSCOPIC + REFLEX CULTURE
Ketones, ur: NEGATIVE
RBC / HPF: NONE SEEN /HPF (ref 0–2)

## 2022-12-04 LAB — URINALYSIS W MICROSCOPIC + REFLEX CULTURE
Bacteria, UA: NONE SEEN /HPF
Bilirubin Urine: NEGATIVE
Glucose, UA: NEGATIVE
Hgb urine dipstick: NEGATIVE
Hyaline Cast: NONE SEEN /LPF
Nitrites, Initial: NEGATIVE
Specific Gravity, Urine: 1.019 (ref 1.001–1.035)
WBC, UA: NONE SEEN /HPF (ref 0–5)
pH: >=8.5 — AB (ref 5.0–8.0)

## 2022-12-04 LAB — CULTURE INDICATED

## 2022-12-04 LAB — URINE CULTURE
MICRO NUMBER:: 15120589
SPECIMEN QUALITY:: ADEQUATE

## 2022-12-05 NOTE — Assessment & Plan Note (Signed)
Follows with neurologist annually.

## 2022-12-05 NOTE — Assessment & Plan Note (Signed)
Symptomatic. Resume Duloxetine 30 mg daily. Continue appropriate foot care.

## 2022-12-05 NOTE — Assessment & Plan Note (Signed)
She is not longer on Losartan. Today BP adequately controlled. Monitor BP at home. Continue non pharmacologic treatment.

## 2022-12-05 NOTE — Assessment & Plan Note (Signed)
We discussed the importance of regular physical activity and healthy diet for prevention of chronic illness and/or complications. Preventive guidelines reviewed. Vaccination: She can get shingrix at her pharmacy. Ca++ and vit D supplementation recommended. Fall precautions discussed. Next CPE in a year.

## 2022-12-05 NOTE — Assessment & Plan Note (Signed)
ABI 02/2020 with right toe-brachial index abnormal, rest normal. She takes Aspirin 81 mg daily. Today we are resuming Pravastatin 20 mg daily.

## 2022-12-05 NOTE — Assessment & Plan Note (Signed)
Resume Pravastatin 20 mg daily and continue low fat diet. FLP ordered today.

## 2022-12-05 NOTE — Assessment & Plan Note (Signed)
Problem has been well controlled with non pharmacologic treatment. Further recommendations according to HgA1C. Annual eye exam and foot care recommended. F/U in 5-6 months

## 2023-01-01 ENCOUNTER — Telehealth: Payer: Self-pay | Admitting: Family Medicine

## 2023-01-01 DIAGNOSIS — R1030 Lower abdominal pain, unspecified: Secondary | ICD-10-CM

## 2023-01-01 NOTE — Telephone Encounter (Signed)
I called and spoke with daughter. We went over the results and patient is still having the right sided abdominal pain. Imaging ordered and daughter aware that they will receive a phone call to schedule appointment.

## 2023-01-01 NOTE — Telephone Encounter (Signed)
Please call daughter lucie with result

## 2023-01-02 ENCOUNTER — Telehealth (INDEPENDENT_AMBULATORY_CARE_PROVIDER_SITE_OTHER): Payer: 59 | Admitting: Family Medicine

## 2023-01-02 ENCOUNTER — Encounter: Payer: Self-pay | Admitting: Family Medicine

## 2023-01-02 ENCOUNTER — Encounter: Payer: 59 | Admitting: Family Medicine

## 2023-01-02 VITALS — Ht 61.0 in | Wt 119.4 lb

## 2023-01-02 DIAGNOSIS — Z0000 Encounter for general adult medical examination without abnormal findings: Secondary | ICD-10-CM

## 2023-01-02 NOTE — Progress Notes (Signed)
PATIENT CHECK-IN and HEALTH RISK ASSESSMENT QUESTIONNAIRE:  -completed by phone/video for upcoming Medicare Preventive Visit  Pre-Visit Check-in: 1)Vitals (height, wt, BP, etc) - record in vitals section for visit on day of visit 2)Review and Update Medications, Allergies PMH, Surgeries, Social history in Epic 3)Hospitalizations in the last year with date/reason? Yes-   4)Review and Update Care Team (patient's specialists) in Epic 5) Complete PHQ9 in Epic  6) Complete Fall Screening in Epic 7)Review all Health Maintenance Due and order under PCP if not done.  8)Medicare Wellness Questionnaire: Answer theses question about your habits: Do you drink alcohol? No   Have you ever smoked?No  How many packs a day do/did you smoke?  Do you use smokeless tobacco?No Do you use an illicit drugs? No Do you exercises? Stays busy on feet all day Are you sexually active? No Number of partners? Typical breakfast: Bread, coffee and avocado  Typical lunch: Rice, Shrimp, Beef, Fish   Typical dinner: Varies  Typical snacks: Varies   Beverages: Tea  Answer theses question about you: Can you perform most household chores?Yes Do you find it hard to follow a conversation in a noisy room?  Yes Do you often ask people to speak up or repeat themselves?No Do you feel that you have a problem with memory?No Do you balance your checkbook and or bank acounts? Yes  Do you feel safe at home? Yes  Last dentist visit? 2 years ago Do you need assistance with any of the following: Please note if so   Driving? No  Feeding yourself? No  Getting from bed to chair? No  Getting to the toilet? No  Bathing or showering? No  Dressing yourself? No  Managing money? No  Climbing a flight of stairs No  Preparing meals? No  Do you have Advanced Directives in place (Living Will, Healthcare Power or Attorney)? Unknown   Last eye Exam and location? 1 year ago   Do you currently use prescribed or non-prescribed  narcotic or opioid pain medications?No  Do you have a history or close family history of breast, ovarian, tubal or peritoneal cancer or a family member with BRCA (breast cancer susceptibility 1 and 2) gene mutations? No  Nurse/Assistant Credentials/time stamp: MG 4:57 PM   ----------------------------------------------------------------------------------------------------------------------------------------------------------------------------------------------------------------------   MEDICARE ANNUAL PREVENTIVE VISIT WITH PROVIDER: (Welcome to [employer], initial annual wellness or annual wellness exam)  Virtual Visit via Phone Note  I connected with Julie Schmidt on 01/02/23 by phone  and verified that I am speaking with the correct person using two identifiers.  Location patient: home Location provider:work or home office Persons participating in the virtual visit: patient, provider, lucy - daughter   Concerns and/or follow up today:  doing well except has some chronic mild LLQ pain for about 1 month, has constipation, sometimes goes several days without a BM, denies melena hematochezia, vomiting. Has lost about 20 lbs in the last year. Reports PCP is aware and she has a CT scan scheduled per PCP. Took some mirilax this morning.    See HM section in Epic for other details of completed HM.    ROS: negative for report of fevers, vision changes, vision loss, hearing loss or change, chest pain, sob, hemoptysis, melena, hematochezia, hematuria, falls, bleeding or bruising, thoughts of suicide or self harm, memory loss  Patient-completed extensive health risk assessment - reviewed and discussed with the patient: See Health Risk Assessment completed with patient prior to the visit either above or in recent phone note. This  was reviewed in detailed with the patient today and appropriate recommendations, orders and referrals were placed as needed per Summary below and patient  instructions.   Review of Medical History: -PMH, PSH, Family History and current specialty and care providers reviewed and updated and listed below   Patient Care Team: [place], [name], MD as PCP - General (Family Medicine) Verner Chol, [facility] (Inactive) as Pharmacist (Pharmacist)   Past Medical History:  Diagnosis Date   Arthritis    knees, back   Diabetes mellitus    Headache    Hypertension     Past Surgical History:  Procedure Laterality Date   ABDOMINAL HYSTERECTOMY     APPENDECTOMY     BRAIN SURGERY  05/19/2014   Craniotomy for meningioma   CARDIAC CATHETERIZATION N/A 11/25/2014   Procedure: Left Heart Cath and Coronary Angiography;  Surgeon: Jake Bathe, MD;  Location: [facility] INVASIVE CV LAB;  Service: Cardiovascular;  Laterality: N/A;   CESAREAN SECTION     CRANIOTOMY Left 05/19/2014   Procedure: CRANIOTOMY TUMOR EXCISION with Curve;  Surgeon: Maeola Harman, MD;  Location: [facility] NEURO ORS;  Service: Neurosurgery;  Laterality: Left;  Left frontal craniotomy for meningioma with brain lab    Social History   Socioeconomic History   Marital status: Widowed    Spouse name: Not on file   Number of children: Not on file   Years of education: Not on file   Highest education level: Not on file  Occupational History   Not on file  Tobacco Use   Smoking status: Never   Smokeless tobacco: Never  Substance and Sexual Activity   Alcohol use: No    Alcohol/week: 0.0 standard drinks of alcohol   Drug use: No   Sexual activity: Not on file  Other Topics Concern   Not on file  Social History Narrative   Not on file   Social Determinants of Health   Financial Resource Strain: Low Risk  (01/08/2021)   Overall Financial Resource Strain (CARDIA)    Difficulty of Paying Living Expenses: Not hard at all  Food Insecurity: Unknown (01/02/2023)   Hunger Vital Sign    Worried About Running Out of Food in the Last Year: Never true    Ran Out of Food in the Last Year: Patient unable  to answer  Transportation Needs: No Transportation Needs (01/02/2023)   PRAPARE - Administrator, Civil Service (Medical): No    Lack of Transportation (Non-Medical): No  Physical Activity: Insufficiently Active (01/08/2021)   Exercise Vital Sign    Days of Exercise per Week: 5 days    Minutes of Exercise per Session: 10 min  Stress: No Stress Concern Present (01/08/2021)   [employer] of Occupational Health - Occupational Stress Questionnaire    Feeling of Stress : Not at all  Social Connections: Unknown (10/23/2021)   [facility]   Social Network    Social Network: Not on file  Intimate Partner Violence: Unknown (09/14/2021)   Received from [facility]   HITS    Physically Hurt: Not on file    Insult or Talk Down To: Not on file    Threaten Physical Harm: Not on file    Scream or Curse: Not on file    Family History  Problem Relation Age of Onset   Cancer Father     Current Outpatient Medications on File Prior to Visit  Medication Sig Dispense Refill   Accu-Chek Softclix Lancets lancets Use  to check blood sugar daily. Dx:e11.49 100 each 12   aspirin 81 MG tablet Take 81 mg by mouth daily.     Blood Glucose Monitoring Suppl (ACCU-CHEK AVIVA CONNECT) w/Device KIT 1 Device by Does not apply route daily. 1 kit 0   Blood Glucose Monitoring Suppl (ACCU-CHEK GUIDE ME) w/Device KIT Use to check blood sugars daily. 1 kit 0   Continuous Blood Gluc Receiver (DEXCOM G6 RECEIVER) DEVI Use to check blood sugars up to 3 times daily. 1 each 1   dorzolamide-timolol (COSOPT) 22.3-6.8 MG/ML ophthalmic solution Place 1 drop into both eyes 2 (two) times daily.     DULoxetine (CYMBALTA) 30 MG capsule Take 1 capsule (30 mg total) by mouth daily. 90 capsule 2   glucose blood (ACCU-CHEK GUIDE) test strip Use to check blood sugars daily. Dx:e11.49 100 each 12   ibuprofen (ADVIL,MOTRIN) 200 MG tablet Take 200 mg by mouth every 6 (six) hours as needed.     Lancets Misc.  (ACCU-CHEK SOFTCLIX LANCET DEV) KIT 1 Device by Does not apply route daily. 100 kit 3   latanoprost (XALATAN) 0.005 % ophthalmic solution 1 drop at bedtime.     polyethylene glycol powder (GLYCOLAX/MIRALAX) powder MIX 255 GRAM PO ONCE FOR 1 DOSE  0   pravastatin (PRAVACHOL) 20 MG tablet Take 1 tablet (20 mg total) by mouth daily. 90 tablet 3   No current facility-administered medications on file prior to visit.    Allergies  Allergen Reactions   Norvasc [Amlodipine] Other (See Comments)    Dizziness and Chest pains at night when lies down   Oxycodone-Acetaminophen Nausea And Vomiting       Physical Exam Vitals requested from patient and listed below if patient had equipment and was able to obtain at home for this virtual visit: There were no vitals filed for this visit. Estimated body mass index is 22.56 kg/m as calculated from the following:   Height as of this encounter: 5\' 1"  (1.549 m).   Weight as of this encounter: 119 lb 6.1 oz (54.2 kg).  EKG (optional): deferred due to virtual visit  GENERAL: alert, oriented, no acute distress detected, full vision exam deferred due to pandemic and/or virtual encounter   PSYCH/NEURO: pleasant and cooperative, no obvious depression or anxiety, speech and thought processing grossly intact, Cognitive function grossly intact  Flowsheet Row Video Visit from [facility]/02/2023 in [facility]  PHQ-9 Total Score 6           01/02/2023    4:48 PM 12/02/2022    8:56 AM 01/08/2021   10:43 AM 01/08/2021   10:39 AM 03/12/2020    2:41 PM  Depression screen PHQ 2/9  Decreased Interest 1 0 0 0 0  Down, Depressed, Hopeless 0 0 0 0 0  PHQ - 2 Score 1 0 0 0 0  Altered sleeping 1      Tired, decreased energy 1      Change in appetite 1      Feeling bad or failure about yourself  1      Trouble concentrating 1      Moving slowly or fidgety/restless 0      Suicidal thoughts 0      PHQ-9 Score 6      Difficult doing  work/chores Not difficult at all           03/12/2020    2:41 PM 01/08/2021   10:43 AM 02/27/2021    2:44 PM 12/02/2022  8:56 AM 01/02/2023    4:47 PM  Fall Risk  Falls in the past year? 0 0  0 0  Was there an injury with Fall? 0 0  0 0  Fall Risk Category Calculator 0 0  0 0  Fall Risk Category (Retired) Low Low     (RETIRED) Patient Fall Risk Level Low fall risk Low fall risk Low fall risk    Patient at Risk for Falls Due to    Other (Comment) No Fall Risks  Fall risk Follow up Education provided Falls evaluation completed  Falls evaluation completed Falls evaluation completed     SUMMARY AND PLAN:  Encounter for Medicare annual wellness exam   Discussed applicable health maintenance/preventive health measures and advised and referred or ordered per patient preferences: -bone density ordered, but reports have not received call yet - advised to call front office in the morning to check on this and they agree to do so -advised to get CT as ordered by PCP and to try fiber supplement each morning, whole foods plant predominent diet, regular exercise, plenty of water and mirilax once daily x 3 days, then twice daily times 3 days if needed to get soft stool and then to continue once daily for 2 weeks if needed to keep stools soft, Advised to follow up in office if any new symptoms, worsening or not improving. Sent message to office to ensure pt has in office follow up with Dr. [place] for this as well. -advised yearly eye exams -discussed recs for covid boosters and she plans to get in the fall Health Maintenance  Topic Date Due   DEXA SCAN  Never done   OPHTHALMOLOGY EXAM  01/01/2022   COVID-19 Vaccine (4 - 2023-24 season) 02/08/2022   Zoster Vaccines- Shingrix (1 of 2) 06/13/2023 (Originally 03/03/1991)   INFLUENZA VACCINE  01/09/2023   HEMOGLOBIN A1C  06/03/2023   Diabetic kidney evaluation - eGFR measurement  12/02/2023   Diabetic kidney evaluation - Urine ACR  12/02/2023   FOOT  EXAM  12/02/2023   Medicare Annual Wellness (AWV)  01/02/2024   DTaP/Tdap/Td (2 - Td or Tdap) 04/03/2031   Pneumonia Vaccine 66+ Years old  Completed   Hepatitis C Screening  Completed   HPV VACCINES  Aged [employer] and counseling on the following was provided based on the above review of health and a plan/checklist for the patient, along with additional information discussed, was provided for the patient in the patient instructions :  -Advised on importance of completing advanced directives, discussed options for completing and provided information in patient instructions as well -Provided  safe balance exercises that can be done at home to improve balance and discussed exercise guidelines for adults with include balance exercises at least 3 days per week.  -Advised and counseled on a healthy lifestyle  -Reviewed patient's current diet. Advised and counseled on a whole foods based healthy diet. A summary of a healthy diet was provided in the Patient Instructions.  -reviewed patient's current physical activity level and discussed exercise guidelines for adults. Discussed community resources and ideas for safe exercise at home to assist in meeting exercise guideline recommendations in a safe and healthy way.  -Advise yearly dental visits at minimum and regular eye exams   Follow up: see patient instructions     Patient Instructions  I really enjoyed getting to talk with you today! I am available on Tuesdays and Thursdays for virtual visits if  you have any questions or concerns, or if I can be of any further assistance.   CHECKLIST FROM ANNUAL WELLNESS VISIT:  -Follow up (please call to schedule if not scheduled after visit):   -yearly for annual wellness visit with primary care office  -follow up with Dr. [place]il[place] after the CT scan in 2-3 weeks  Here is a list of your preventive care/health maintenance measures and the plan for each if any are due:  PLAN For any  measures below that may be due:  -call office in the morning to check on dexa -schedule eye exam -get covid and flu vaccines in the fall  Health Maintenance  Topic Date Due   DEXA SCAN  Never done   OPHTHALMOLOGY EXAM  01/01/2022   COVID-19 Vaccine (4 - 2023-24 season) 02/08/2022   Zoster Vaccines- Shingrix (1 of 2) 06/13/2023 (Originally 03/03/1991)   INFLUENZA VACCINE  01/09/2023   HEMOGLOBIN A1C  06/03/2023   Diabetic kidney evaluation - eGFR measurement  12/02/2023   Diabetic kidney evaluation - Urine ACR  12/02/2023   FOOT EXAM  12/02/2023   Medicare Annual Wellness (AWV)  01/02/2024   DTaP/Tdap/Td (2 - Td or Tdap) 04/03/2031   Pneumonia Vaccine 18+ Years old  Completed   Hepatitis C Screening  Completed   HPV VACCINES  Aged Out    -See a dentist at least yearly  -Get your eyes checked and then per your eye specialist's recommendations  -Other issues addressed today:   -I have included below further information regarding a healthy whole foods based diet, physical activity guidelines for adults, stress management and opportunities for social connections. I hope you find this information useful.   -----------------------------------------------------------------------------------------------------------------------------------------------------------------------------------------------------------------------------------------------------------  NUTRITION: -eat real food: lots of colorful vegetables (half the plate) and fruits -5-7 servings of vegetables and fruits per day (fresh or steamed is best), exp. 2 servings of vegetables with lunch and dinner and 2 servings of fruit per day. Berries and greens such as kale and collards are great choices.  -consume on a regular basis: whole grains (make sure first ingredient on label contains the word "whole"), fresh fruits, fish, nuts, seeds, healthy oils (such as olive oil, avocado oil, grape seed oil) -may eat small amounts of dairy  and lean meat on occasion, but avoid processed meats such as ham, bacon, lunch meat, etc. -drink water -try to avoid fast food and pre-packaged foods, processed meat -most experts advise limiting sodium to < 2300mg  per day, should limit further is any chronic conditions such as high blood pressure, heart disease, diabetes, etc. The American Heart Association advised that < 1500mg  is is ideal -try to avoid foods that contain any ingredients with names you do not recognize  -try to avoid sugar/sweets (except for the natural sugar that occurs in fresh fruit) -try to avoid sweet drinks -try to avoid white rice, white bread, pasta (unless whole grain), white or yellow potatoes  EXERCISE GUIDELINES FOR ADULTS: -if you wish to increase your physical activity, do so gradually and with the approval of your doctor -STOP and seek medical care immediately if you have any chest pain, chest discomfort or trouble breathing when starting or increasing exercise  -move and stretch your body, legs, feet and arms when sitting for long periods -Physical activity guidelines for optimal health in adults: -least 150 minutes per week of aerobic exercise (can talk, but not sing) once approved by your doctor, 20-30 minutes of sustained activity or two 10 minute episodes of sustained activity every  day.  -resistance training at least 2 days per week if approved by your doctor -balance exercises 3+ days per week:   Stand somewhere where you have something sturdy to hold onto if you lose balance.    1) lift up on toes, start with 5x per day and work up to 20x   2) stand and lift on leg straight out to the side so that foot is a few inches of the floor, start with 5x each side and work up to 20x each side   3) stand on one foot, start with 5 seconds each side and work up to 20 seconds on each side  If you need ideas or help with getting more active:  -Silver sneakers https://tools.silversneakers.com  -Walk with a  Doc: http://www.duncan-williams.com/  -try to include resistance (weight lifting/strength building) and balance exercises twice per week: or the following link for ideas: http://castillo-powell.com/  BuyDucts.dk  STRESS MANAGEMENT: -can try meditating, or just sitting quietly with deep breathing while intentionally relaxing all parts of your body for 5 minutes daily -if you need further help with stress, anxiety or depression please follow up with your primary doctor or contact the wonderful folks at [facility]: [phone]  SOCIAL CONNECTIONS: -options in [place] if you wish to engage in more social and exercise related activities:  -Silver sneakers https://tools.silversneakers.com  -Walk with a Doc: http://www.duncan-williams.com/  -Check out the [facility] Active Adults 50+ section on the [employer] of [employer] (hiking clubs, book clubs, cards and games, chess, exercise classes, aquatic classes and much more) - see the website for details: https://www.[place]-.gov/departments/parks-recreation/active-adults50  -YouTube has lots of exercise videos for different ages and abilities as well  -[facility] (a variety of indoor and outdoor inperson activities for adults). [phone]. [address]  -Virtual Online Classes (a variety of topics): see seniorplanet.org or call [phone]) [phone]  -consider volunteering at a school, hospice center, church, senior center or elsewhere           Terressa Koyanagi, DO

## 2023-01-02 NOTE — Patient Instructions (Signed)
I really enjoyed getting to talk with you today! I am available on Tuesdays and Thursdays for virtual visits if you have any questions or concerns, or if I can be of any further assistance.   CHECKLIST FROM ANNUAL WELLNESS VISIT:  -Follow up (please call to schedule if not scheduled after visit):   -yearly for annual wellness visit with primary care office  -follow up with Dr. Swaziland after the CT scan in 2-3 weeks  Here is a list of your preventive care/health maintenance measures and the plan for each if any are due:  PLAN For any measures below that may be due:  -call office in the morning to check on dexa -schedule eye exam -get covid and flu vaccines in the fall  Health Maintenance  Topic Date Due   DEXA SCAN  Never done   OPHTHALMOLOGY EXAM  01/01/2022   COVID-19 Vaccine (4 - 2023-24 season) 02/08/2022   Zoster Vaccines- Shingrix (1 of 2) 06/13/2023 (Originally 03/03/1991)   INFLUENZA VACCINE  01/09/2023   HEMOGLOBIN A1C  06/03/2023   Diabetic kidney evaluation - eGFR measurement  12/02/2023   Diabetic kidney evaluation - Urine ACR  12/02/2023   FOOT EXAM  12/02/2023   Medicare Annual Wellness (AWV)  01/02/2024   DTaP/Tdap/Td (2 - Td or Tdap) 04/03/2031   Pneumonia Vaccine 43+ Years old  Completed   Hepatitis C Screening  Completed   HPV VACCINES  Aged Out    -See a dentist at least yearly  -Get your eyes checked and then per your eye specialist's recommendations  -Other issues addressed today:   -I have included below further information regarding a healthy whole foods based diet, physical activity guidelines for adults, stress management and opportunities for social connections. I hope you find this information useful.    -----------------------------------------------------------------------------------------------------------------------------------------------------------------------------------------------------------------------------------------------------------  NUTRITION: -eat real food: lots of colorful vegetables (half the plate) and fruits -5-7 servings of vegetables and fruits per day (fresh or steamed is best), exp. 2 servings of vegetables with lunch and dinner and 2 servings of fruit per day. Berries and greens such as kale and collards are great choices.  -consume on a regular basis: whole grains (make sure first ingredient on label contains the word "whole"), fresh fruits, fish, nuts, seeds, healthy oils (such as olive oil, avocado oil, grape seed oil) -may eat small amounts of dairy and lean meat on occasion, but avoid processed meats such as ham, bacon, lunch meat, etc. -drink water -try to avoid fast food and pre-packaged foods, processed meat -most experts advise limiting sodium to < 2300mg  per day, should limit further is any chronic conditions such as high blood pressure, heart disease, diabetes, etc. The American Heart Association advised that < 1500mg  is is ideal -try to avoid foods that contain any ingredients with names you do not recognize  -try to avoid sugar/sweets (except for the natural sugar that occurs in fresh fruit) -try to avoid sweet drinks -try to avoid white rice, white bread, pasta (unless whole grain), white or yellow potatoes  EXERCISE GUIDELINES FOR ADULTS: -if you wish to increase your physical activity, do so gradually and with the approval of your doctor -STOP and seek medical care immediately if you have any chest pain, chest discomfort or trouble breathing when starting or increasing exercise  -move and stretch your body, legs, feet and arms when sitting for long periods -Physical activity guidelines for optimal health in adults: -least 150 minutes per week of  aerobic exercise (can talk, but not  sing) once approved by your doctor, 20-30 minutes of sustained activity or two 10 minute episodes of sustained activity every day.  -resistance training at least 2 days per week if approved by your doctor -balance exercises 3+ days per week:   Stand somewhere where you have something sturdy to hold onto if you lose balance.    1) lift up on toes, start with 5x per day and work up to 20x   2) stand and lift on leg straight out to the side so that foot is a few inches of the floor, start with 5x each side and work up to 20x each side   3) stand on one foot, start with 5 seconds each side and work up to 20 seconds on each side  If you need ideas or help with getting more active:  -Silver sneakers https://tools.silversneakers.com  -Walk with a Doc: http://www.duncan-williams.com/  -try to include resistance (weight lifting/strength building) and balance exercises twice per week: or the following link for ideas: http://castillo-powell.com/  BuyDucts.dk  STRESS MANAGEMENT: -can try meditating, or just sitting quietly with deep breathing while intentionally relaxing all parts of your body for 5 minutes daily -if you need further help with stress, anxiety or depression please follow up with your primary doctor or contact the wonderful folks at [facility]: [phone]  SOCIAL CONNECTIONS: -options in [place] if you wish to engage in more social and exercise related activities:  -Silver sneakers https://tools.silversneakers.com  -Walk with a Doc: http://www.duncan-williams.com/  -Check out the [facility] Active Adults 50+ section on the [place] of [employer] (hiking clubs, book clubs, cards and games, chess, exercise classes, aquatic classes and much more) - see the website for  details: https://www.[place].gov/departments/parks-recreation/active-adults50  -YouTube has lots of exercise videos for different ages and abilities as well  -[facility] (a variety of indoor and outdoor inperson activities for adults). [phone]. [address]  -Virtual Online Classes (a variety of topics): see seniorplanet.org or call [phone]  -consider volunteering at a school, hospice center, church, senior center or elsewhere

## 2023-01-02 NOTE — Progress Notes (Signed)
"  Patient was unable to self-report due to a lack of equipment at home via telehealth"

## 2023-01-16 ENCOUNTER — Ambulatory Visit (INDEPENDENT_AMBULATORY_CARE_PROVIDER_SITE_OTHER)
Admission: RE | Admit: 2023-01-16 | Discharge: 2023-01-16 | Disposition: A | Discharge status: Home / Self Care | Payer: 59 | Source: Ambulatory Visit

## 2023-01-16 DIAGNOSIS — Z780 Asymptomatic menopausal state: Secondary | ICD-10-CM | POA: Diagnosis not present

## 2023-01-18 ENCOUNTER — Encounter: Payer: Self-pay | Admitting: Family Medicine

## 2023-01-18 DIAGNOSIS — M810 Age-related osteoporosis without current pathological fracture: Secondary | ICD-10-CM | POA: Insufficient documentation

## 2023-01-22 ENCOUNTER — Ambulatory Visit
Admission: RE | Admit: 2023-01-22 | Discharge: 2023-01-22 | Disposition: A | Discharge status: Home / Self Care | Payer: 59 | Source: Ambulatory Visit | Attending: Family Medicine | Admitting: Family Medicine

## 2023-01-22 DIAGNOSIS — R1030 Lower abdominal pain, unspecified: Secondary | ICD-10-CM

## 2023-01-22 MED ORDER — IOPAMIDOL (ISOVUE-300) INJECTION 61%
80.0000 mL | Freq: Once | INTRAVENOUS | Status: AC | PRN
  Administered 2023-01-22: 80 mL via INTRAVENOUS

## 2023-01-28 NOTE — Progress Notes (Unsigned)
HPI: Julie Schmidt is a 82 y.o. female, who is here today with her daughter for follow up. She was last seen on 12/02/22, when she was c/o RLQ abdominal pain, for which abdominal CT was recommended. She reports that RLQ abdominal pain has resolved. She is taking Miralax daily and has helps with bowel movements regularity.  Abdominal/pelvic CT done on 01/22/23: 1. No acute abdominal or pelvic pathology. 2. A 2.1 cm masslike area in the proximal stomach at the esophagogastric junction concerning for gastric malignancy until proven otherwise. Recommend further evaluation with an upper endoscopy.  She reports occasional epigastric abdominal pain/discomfort since 1970. It is not radiated. Heartburn and acid reflux specially at night. Small hiatal hernia seen on abdominal CT.  She was on Pantoprazole, which helped. She is now taking  Alka-Seltzer for heartburn. Denies abnormal wt loss, dysphagia, N/V,melena,or blood in stool.  She reports "knot" on right mandible, noted for a while, no changes in size and not tender. No skin changes.  Review of Systems  Constitutional:  Negative for activity change, appetite change and fever.  HENT:  Negative for mouth sores and sore throat.   Respiratory:  Negative for cough, shortness of breath and wheezing.   Cardiovascular:  Negative for chest pain, palpitations and leg swelling.  Genitourinary:  Negative for decreased urine volume, dysuria and hematuria.  Neurological:  Negative for syncope, weakness and headaches.  See other pertinent positives and negatives in HPI.  Current Outpatient Medications on File Prior to Visit  Medication Sig Dispense Refill   Accu-Chek Softclix Lancets lancets Use to check blood sugar daily. Dx:e11.49 100 each 12   aspirin 81 MG tablet Take 81 mg by mouth daily.     Blood Glucose Monitoring Suppl (ACCU-CHEK AVIVA CONNECT) w/Device KIT 1 Device by Does not apply route daily. 1 kit 0   Blood Glucose Monitoring Suppl  (ACCU-CHEK GUIDE ME) w/Device KIT Use to check blood sugars daily. 1 kit 0   Continuous Blood Gluc Receiver (DEXCOM G6 RECEIVER) DEVI Use to check blood sugars up to 3 times daily. 1 each 1   dorzolamide-timolol (COSOPT) 22.3-6.8 MG/ML ophthalmic solution Place 1 drop into both eyes 2 (two) times daily.     DULoxetine (CYMBALTA) 30 MG capsule Take 1 capsule (30 mg total) by mouth daily. 90 capsule 2   glucose blood (ACCU-CHEK GUIDE) test strip Use to check blood sugars daily. Dx:e11.49 100 each 12   ibuprofen (ADVIL,MOTRIN) 200 MG tablet Take 200 mg by mouth every 6 (six) hours as needed.     Lancets Misc. (ACCU-CHEK SOFTCLIX LANCET DEV) KIT 1 Device by Does not apply route daily. 100 kit 3   latanoprost (XALATAN) 0.005 % ophthalmic solution 1 drop at bedtime.     polyethylene glycol powder (GLYCOLAX/MIRALAX) powder MIX 255 GRAM PO ONCE FOR 1 DOSE  0   pravastatin (PRAVACHOL) 20 MG tablet Take 1 tablet (20 mg total) by mouth daily. 90 tablet 3   No current facility-administered medications on file prior to visit.    Past Medical History:  Diagnosis Date   Arthritis    knees, back   Diabetes mellitus    Headache    Hypertension    Allergies  Allergen Reactions   Norvasc [Amlodipine] Other (See Comments)    Dizziness and Chest pains at night when lies down   Oxycodone-Acetaminophen Nausea And Vomiting    Social History   Socioeconomic History   Marital status: Widowed    Spouse name: Not on  file   Number of children: Not on file   Years of education: Not on file   Highest education level: Not on file  Occupational History   Not on file  Tobacco Use   Smoking status: Never   Smokeless tobacco: Never  Substance and Sexual Activity   Alcohol use: No    Alcohol/week: 0.0 standard drinks of alcohol   Drug use: No   Sexual activity: Not on file  Other Topics Concern   Not on file  Social History Narrative   Not on file   Social Determinants of Health   Financial Resource  Strain: Low Risk  (01/08/2021)   Overall Financial Resource Strain (CARDIA)    Difficulty of Paying Living Expenses: Not hard at all  Food Insecurity: Unknown (01/02/2023)   Hunger Vital Sign    Worried About Running Out of Food in the Last Year: Never true    Ran Out of Food in the Last Year: Patient unable to answer  Transportation Needs: No Transportation Needs (01/02/2023)   PRAPARE - Administrator, Civil Service (Medical): No    Lack of Transportation (Non-Medical): No  Physical Activity: Insufficiently Active (01/08/2021)   Exercise Vital Sign    Days of Exercise per Week: 5 days    Minutes of Exercise per Session: 10 min  Stress: No Stress Concern Present (01/08/2021)   [employer] of Occupational Health - Occupational Stress Questionnaire    Feeling of Stress : Not at all  Social Connections: Unknown (10/23/2021)   Received from [facility]   Social Network    Social Network: Not on file   Vitals:   01/29/23 1507  BP: 120/80  Pulse: (!) 56  Resp: 16  Temp: 98.6 F (37 C)  SpO2: 94%   Wt Readings from Last 3 Encounters:  01/29/23 126 lb 8 oz (57.4 kg)  01/02/23 119 lb 6.1 oz (54.2 kg)  12/02/22 119 lb 6 oz (54.1 kg)   Body mass index is 23.75 kg/m.  Physical Exam Vitals and nursing note reviewed.  Constitutional:      General: She is not in acute distress.    Appearance: She is well-developed.  HENT:     Head: Normocephalic and atraumatic.     Mouth/Throat:     Mouth: Mucous membranes are moist.     Pharynx: Oropharynx is clear.     Comments: I do not appreciated mandibular asymmetries or abnormalities. Eyes:     Conjunctiva/sclera: Conjunctivae normal.  Cardiovascular:     Rate and Rhythm: Regular rhythm. Bradycardia present.     Heart sounds: No murmur heard. Pulmonary:     Effort: Pulmonary effort is normal. No respiratory distress.     Breath sounds: Normal breath sounds.  Abdominal:     Palpations: Abdomen is soft.  There is no hepatomegaly or mass.     Tenderness: There is no abdominal tenderness.  Lymphadenopathy:     Cervical: No cervical adenopathy.  Skin:    General: Skin is warm.     Findings: No erythema or rash.  Neurological:     General: No focal deficit present.     Mental Status: She is alert and oriented to person, place, and time.     Cranial Nerves: No cranial nerve deficit.     Gait: Gait normal.  Psychiatric:        Mood and Affect: Mood and affect normal.   ASSESSMENT AND PLAN:  Ms. Mykalah was seen today  for results.  Diagnoses and all orders for this visit:  Gastric mass Assessment & Plan: We reviewed abdominal CT report and findings. Possible etiologies, concerning for malignancy. Lesion needs to be biopsied, EGD was recommended, so GI referral placed.  Orders: -     Ambulatory referral to Gastroenterology  Localized osteoporosis without current pathological fracture Assessment & Plan: We discussed DEXA results and treatment options. We discussed side effects of Fosamax and other pharmacologic options like Prolia but agree on holding on starting treatment until she undergoes EGD and Bx to evaluate gastric mass. Continue fall precautions, Ca++ and vit D supplementation,as well as regular weightbearing exercises.   Gastroesophageal reflux disease without esophagitis Assessment & Plan: Reporting nocturnal symptoms. Resume Pantoprazole 40 mg daily. Continue GERD precautions.  Orders: -     Pantoprazole Sodium; Take 1 tablet (40 mg total) by mouth daily.  Dispense: 90 tablet; Refill: 1   I spent a total of 37 minutes in both face to face and non face to face activities for this visit on the date of this encounter. During this time history was obtained and documented, examination was performed, prior labs/imaging reviewed, and assessment/plan discussed.   Return in about 4 months (around 05/31/2023) for chronic problems.  Mikita Lesmeister G. [place], MD  [facility]. [place] office.

## 2023-01-29 ENCOUNTER — Encounter: Payer: Self-pay | Admitting: Family Medicine

## 2023-01-29 ENCOUNTER — Ambulatory Visit (INDEPENDENT_AMBULATORY_CARE_PROVIDER_SITE_OTHER): Payer: 59 | Admitting: Family Medicine

## 2023-01-29 VITALS — BP 120/80 | HR 56 | Temp 98.6°F | Resp 16 | Ht 61.2 in | Wt 126.5 lb

## 2023-01-29 DIAGNOSIS — I739 Peripheral vascular disease, unspecified: Secondary | ICD-10-CM

## 2023-01-29 DIAGNOSIS — M816 Localized osteoporosis [Lequesne]: Secondary | ICD-10-CM | POA: Diagnosis not present

## 2023-01-29 DIAGNOSIS — K3189 Other diseases of stomach and duodenum: Secondary | ICD-10-CM

## 2023-01-29 DIAGNOSIS — K219 Gastro-esophageal reflux disease without esophagitis: Secondary | ICD-10-CM

## 2023-01-29 MED ORDER — PANTOPRAZOLE SODIUM 40 MG PO TBEC
40.0000 mg | DELAYED_RELEASE_TABLET | Freq: Every day | ORAL | 1 refills | Status: DC
Start: 2023-01-29 — End: 2023-02-12

## 2023-01-29 NOTE — Patient Instructions (Addendum)
A few things to remember from today's visit:  Localized osteoporosis without current pathological fracture  Gastroesophageal reflux disease without esophagitis - Plan: pantoprazole (PROTONIX) 40 MG tablet  Gastric mass - Plan: Ambulatory referral to Gastroenterology We can hold on medication for osteoporosis until stomach lesion is evaluated by gastroenterologist. Resume Protonix 40 mg daily 30 min before breakfast.  If you need refills for medications you take chronically, please call your pharmacy. Do not use My Chart to request refills or for acute issues that need immediate attention. If you send a my chart message, it may take a few days to be addressed, specially if I am not in the office.  Please be sure medication list is accurate. If a new problem present, please set up appointment sooner than planned today.

## 2023-01-30 NOTE — Assessment & Plan Note (Signed)
We reviewed abdominal CT report and findings. Possible etiologies, concerning for malignancy. Lesion needs to be biopsied, EGD was recommended, so GI referral placed.

## 2023-01-30 NOTE — Assessment & Plan Note (Signed)
We discussed DEXA results and treatment options. We discussed side effects of Fosamax and other pharmacologic options like Prolia but agree on holding on starting treatment until she undergoes EGD and Bx to evaluate gastric mass. Continue fall precautions, Ca++ and vit D supplementation,as well as regular weightbearing exercises.

## 2023-01-30 NOTE — Assessment & Plan Note (Signed)
Reporting nocturnal symptoms. Resume Pantoprazole 40 mg daily. Continue GERD precautions.

## 2023-02-03 ENCOUNTER — Encounter: Payer: Self-pay | Admitting: Physician Assistant

## 2023-02-03 ENCOUNTER — Telehealth: Payer: Self-pay | Admitting: Family Medicine

## 2023-02-03 DIAGNOSIS — K3189 Other diseases of stomach and duodenum: Secondary | ICD-10-CM

## 2023-02-03 NOTE — Telephone Encounter (Signed)
Daughter called to say she reached out to [facility] and was told they cannot see Pt until November.   They are asking if MD could refer them to a location that can see Pt sooner, because as per Daughter, MD said it is Urgent.   Please advise.

## 2023-02-03 NOTE — Telephone Encounter (Signed)
New referral placed to be sent to another location/office.

## 2023-02-12 ENCOUNTER — Other Ambulatory Visit: Payer: Self-pay

## 2023-02-12 DIAGNOSIS — K219 Gastro-esophageal reflux disease without esophagitis: Secondary | ICD-10-CM

## 2023-02-12 DIAGNOSIS — G63 Polyneuropathy in diseases classified elsewhere: Secondary | ICD-10-CM

## 2023-02-12 MED ORDER — DULOXETINE HCL 30 MG PO CPEP
30.0000 mg | ORAL_CAPSULE | Freq: Every day | ORAL | 2 refills | Status: DC
Start: 2023-02-12 — End: 2023-04-09

## 2023-02-12 MED ORDER — PANTOPRAZOLE SODIUM 40 MG PO TBEC
40.0000 mg | DELAYED_RELEASE_TABLET | Freq: Every day | ORAL | 1 refills | Status: DC
Start: 2023-02-12 — End: 2023-04-09

## 2023-02-17 DIAGNOSIS — R634 Abnormal weight loss: Secondary | ICD-10-CM | POA: Diagnosis not present

## 2023-02-17 DIAGNOSIS — R933 Abnormal findings on diagnostic imaging of other parts of digestive tract: Secondary | ICD-10-CM | POA: Diagnosis not present

## 2023-02-17 DIAGNOSIS — R1011 Right upper quadrant pain: Secondary | ICD-10-CM | POA: Diagnosis not present

## 2023-02-17 DIAGNOSIS — K319 Disease of stomach and duodenum, unspecified: Secondary | ICD-10-CM | POA: Diagnosis not present

## 2023-02-24 ENCOUNTER — Telehealth: Payer: Self-pay | Admitting: Physician Assistant

## 2023-02-24 NOTE — Telephone Encounter (Signed)
We have received as referral from patient needing  to be seen for a GASTRIC MASS.  Patient is currently scheduled for 11/7 at 2:30.   Please advise on scheduling

## 2023-02-25 ENCOUNTER — Other Ambulatory Visit: Payer: Self-pay

## 2023-02-25 DIAGNOSIS — K219 Gastro-esophageal reflux disease without esophagitis: Secondary | ICD-10-CM

## 2023-02-25 DIAGNOSIS — K3189 Other diseases of stomach and duodenum: Secondary | ICD-10-CM

## 2023-02-26 ENCOUNTER — Telehealth: Payer: Self-pay

## 2023-02-26 ENCOUNTER — Ambulatory Visit: Payer: 59 | Admitting: Gastroenterology

## 2023-02-26 NOTE — Telephone Encounter (Signed)
This patient was scheduled urgently on my schedule for 150PM today (yesterday-Tuesday). This was confirmed with the daughter yesterday per my team. I actually also got her an immediate EGD scheduled for Thursday (tomorrow). Daughter then called and cancelled our appointment today even though she had agreed to it yesterday. I called Dr. Haywood Pao office and he had actually already seen patient a week ago and had her scheduled for an EGD. This is unfortunate for the patient, but certainly not acceptable for her to have taken an urgent clinic spot and then cancelled in the last moment, when she already had a clinic appointment with another GI and already a procedure scheduled with them.   I have now also lost a spot for an urgent procedure slot for tomorrow at the hospital as well. I hope she doesn't have a malignancy in the stomach and wish her and family the best with upcoming procedure with Dr. Elnoria Howard. However the patient will need to be assessed the late cancellation fee at a minimum for this last minute clinic visit cancellation today.   Corliss Parish, MD [facility] Office # [phone]

## 2023-02-26 NOTE — Telephone Encounter (Signed)
FYI- Called daughter this afternoon because they did not show for appointment after speaking with them yesterday. Daughter informed me that they had an appointment with Dr. Haywood Pao office. Requested to cancel appointment and EGD that we had scheduled for them on 02/27/23. Appointments cancelled.

## 2023-02-26 NOTE — Progress Notes (Signed)
Left HIPAA compliant voicemail  Addendum. Procedure canceled

## 2023-02-26 NOTE — Telephone Encounter (Signed)
Noted, separate note in chart as well from me.

## 2023-02-27 ENCOUNTER — Encounter (HOSPITAL_COMMUNITY): Admission: RE | Payer: Self-pay | Source: Home / Self Care

## 2023-02-27 ENCOUNTER — Ambulatory Visit (HOSPITAL_COMMUNITY): Admission: RE | Admit: 2023-02-27 | Payer: 59 | Source: Home / Self Care | Admitting: Gastroenterology

## 2023-02-27 DIAGNOSIS — R933 Abnormal findings on diagnostic imaging of other parts of digestive tract: Secondary | ICD-10-CM | POA: Diagnosis not present

## 2023-02-27 DIAGNOSIS — K449 Diaphragmatic hernia without obstruction or gangrene: Secondary | ICD-10-CM | POA: Diagnosis not present

## 2023-02-27 DIAGNOSIS — R1011 Right upper quadrant pain: Secondary | ICD-10-CM | POA: Diagnosis not present

## 2023-02-27 SURGERY — ESOPHAGOGASTRODUODENOSCOPY (EGD) WITH PROPOFOL
Anesthesia: Monitor Anesthesia Care

## 2023-03-10 ENCOUNTER — Other Ambulatory Visit: Payer: Self-pay

## 2023-03-10 DIAGNOSIS — E785 Hyperlipidemia, unspecified: Secondary | ICD-10-CM

## 2023-03-10 DIAGNOSIS — E1169 Type 2 diabetes mellitus with other specified complication: Secondary | ICD-10-CM

## 2023-03-10 MED ORDER — PRAVASTATIN SODIUM 20 MG PO TABS
20.0000 mg | ORAL_TABLET | Freq: Every day | ORAL | 3 refills | Status: DC
Start: 2023-03-10 — End: 2023-04-09

## 2023-03-11 DIAGNOSIS — H26492 Other secondary cataract, left eye: Secondary | ICD-10-CM | POA: Diagnosis not present

## 2023-03-11 DIAGNOSIS — H401112 Primary open-angle glaucoma, right eye, moderate stage: Secondary | ICD-10-CM | POA: Diagnosis not present

## 2023-03-11 DIAGNOSIS — H401121 Primary open-angle glaucoma, left eye, mild stage: Secondary | ICD-10-CM | POA: Diagnosis not present

## 2023-03-11 DIAGNOSIS — E119 Type 2 diabetes mellitus without complications: Secondary | ICD-10-CM | POA: Diagnosis not present

## 2023-03-11 LAB — HM DIABETES EYE EXAM

## 2023-03-20 ENCOUNTER — Telehealth: Payer: Self-pay | Admitting: Family Medicine

## 2023-03-20 NOTE — Telephone Encounter (Signed)
Pt call and stated she have been waiting 2 wk's for     pravastatin (PRAVACHOL) 20 MG tablet and [facility] told her they didn't receive a RX .

## 2023-04-07 ENCOUNTER — Ambulatory Visit (INDEPENDENT_AMBULATORY_CARE_PROVIDER_SITE_OTHER): Payer: 59

## 2023-04-07 DIAGNOSIS — Z23 Encounter for immunization: Secondary | ICD-10-CM

## 2023-04-09 ENCOUNTER — Telehealth: Payer: Self-pay | Admitting: Family Medicine

## 2023-04-09 DIAGNOSIS — E785 Hyperlipidemia, unspecified: Secondary | ICD-10-CM

## 2023-04-09 DIAGNOSIS — G63 Polyneuropathy in diseases classified elsewhere: Secondary | ICD-10-CM

## 2023-04-09 DIAGNOSIS — K219 Gastro-esophageal reflux disease without esophagitis: Secondary | ICD-10-CM

## 2023-04-09 MED ORDER — PRAVASTATIN SODIUM 20 MG PO TABS: 20.0000 mg | ORAL_TABLET | Freq: Every day | ORAL | 3 refills | Status: DC

## 2023-04-09 MED ORDER — PANTOPRAZOLE SODIUM 40 MG PO TBEC: 40.0000 mg | DELAYED_RELEASE_TABLET | Freq: Every day | ORAL | 1 refills | Status: DC

## 2023-04-09 MED ORDER — DULOXETINE HCL 30 MG PO CPEP: 30.0000 mg | ORAL_CAPSULE | Freq: Every day | ORAL | 2 refills | Status: DC

## 2023-04-09 NOTE — Telephone Encounter (Signed)
Prescription Request  04/09/2023  LOV: 01/29/2023  What is the name of the medication or equipment? DULoxetine DULoxetine (CYMBALTA) 30 MG capsule  Have you contacted your pharmacy to request a refill? No  Also: Daughter states Pt is completely out of her BP medication and is about to travel, and needs this refill sent, asap!! Daughter was not sure of the name of said medication.    Which pharmacy would you like this sent to?   [facility] [phone] - [place],  - [address] AT [facility] [address] [place] [address] Phone: [phone] Fax: [phone]    Patient notified that their request is being sent to the clinical staff for review and that they should receive a response within 2 business days.   Please advise at Mobile [phone] (mobile)

## 2023-04-09 NOTE — Telephone Encounter (Signed)
Rx sent 

## 2023-04-09 NOTE — Addendum Note (Signed)
Addended by: Weyman Croon E on: 04/09/2023 01:56 PM   Modules accepted: Orders

## 2023-04-17 ENCOUNTER — Ambulatory Visit: Payer: 59 | Admitting: Physician Assistant

## 2023-05-30 ENCOUNTER — Ambulatory Visit: Payer: 59 | Admitting: Family Medicine

## 2023-06-09 ENCOUNTER — Ambulatory Visit: Payer: 59 | Admitting: Family Medicine

## 2023-06-11 ENCOUNTER — Other Ambulatory Visit: Payer: Self-pay | Admitting: Family Medicine

## 2023-06-11 DIAGNOSIS — K219 Gastro-esophageal reflux disease without esophagitis: Secondary | ICD-10-CM

## 2024-03-24 NOTE — Progress Notes (Signed)
 Julie Schmidt                                          MRN: [id number]   03/24/2024   The [facility]BCI Quality Team Specialist reviewed this patient medical record for the purposes of chart review for care gap closure. The following were reviewed: chart review for care gap closure-kidney health evaluation for diabetes:eGFR  and uACR.    VBCI Quality Team

## 2024-04-21 ENCOUNTER — Encounter: Payer: Self-pay | Admitting: Family Medicine

## 2024-04-21 ENCOUNTER — Ambulatory Visit (INDEPENDENT_AMBULATORY_CARE_PROVIDER_SITE_OTHER): Admitting: Family Medicine

## 2024-04-21 VITALS — BP 130/68 | HR 60 | Temp 98.6°F | Resp 16 | Ht 61.2 in | Wt 125.8 lb

## 2024-04-21 DIAGNOSIS — G63 Polyneuropathy in diseases classified elsewhere: Secondary | ICD-10-CM

## 2024-04-21 DIAGNOSIS — K219 Gastro-esophageal reflux disease without esophagitis: Secondary | ICD-10-CM | POA: Diagnosis not present

## 2024-04-21 DIAGNOSIS — Z23 Encounter for immunization: Secondary | ICD-10-CM

## 2024-04-21 DIAGNOSIS — E1149 Type 2 diabetes mellitus with other diabetic neurological complication: Secondary | ICD-10-CM | POA: Diagnosis not present

## 2024-04-21 DIAGNOSIS — Z0000 Encounter for general adult medical examination without abnormal findings: Secondary | ICD-10-CM | POA: Diagnosis not present

## 2024-04-21 DIAGNOSIS — E1169 Type 2 diabetes mellitus with other specified complication: Secondary | ICD-10-CM | POA: Diagnosis not present

## 2024-04-21 DIAGNOSIS — M816 Localized osteoporosis [Lequesne]: Secondary | ICD-10-CM

## 2024-04-21 DIAGNOSIS — E785 Hyperlipidemia, unspecified: Secondary | ICD-10-CM | POA: Diagnosis not present

## 2024-04-21 DIAGNOSIS — I10 Essential (primary) hypertension: Secondary | ICD-10-CM

## 2024-04-21 MED ORDER — PANTOPRAZOLE SODIUM 40 MG PO TBEC: 40.0000 mg | DELAYED_RELEASE_TABLET | Freq: Every day | ORAL | 3 refills | Status: AC

## 2024-04-21 MED ORDER — ROSUVASTATIN CALCIUM 5 MG PO TABS: 5.0000 mg | ORAL_TABLET | Freq: Every day | ORAL | 3 refills | Status: AC

## 2024-04-21 MED ORDER — ALENDRONATE SODIUM 70 MG PO TABS: 70.0000 mg | ORAL_TABLET | ORAL | 2 refills | Status: DC

## 2024-04-21 MED ORDER — DULOXETINE HCL 30 MG PO CPEP: 30.0000 mg | ORAL_CAPSULE | Freq: Every day | ORAL | 2 refills | Status: AC

## 2024-04-21 NOTE — Assessment & Plan Note (Signed)
 Problem is not well-controlled since she ran out of PPI. Pantoprazole  40 mg daily to be resumed, to take before breakfast. Continue GERD precautions.

## 2024-04-21 NOTE — Assessment & Plan Note (Signed)
 She discontinued pravastatin  due to side effects, which she cannot remember. She agrees with trying rosuvastatin 5 mg daily. Continue low-fat diet.

## 2024-04-21 NOTE — Assessment & Plan Note (Signed)
 Resume duloxetine  30 mg daily. Continue appropriate footcare.

## 2024-04-21 NOTE — Assessment & Plan Note (Signed)
 Problem has been well controlled with non pharmacologic treatment. Last hemoglobin A1c 6.1 in 11/2022. Further recommendation will be given according to hemoglobin A1c result. Appropriate footcare, annual eye exam, and dental preventive care recommended. F/U in 5-6 months

## 2024-04-21 NOTE — Assessment & Plan Note (Addendum)
 She reports that currently she is on amlodipine  5 mg daily. Continue low-salt diet. Eye exam is due.

## 2024-04-21 NOTE — Progress Notes (Addendum)
 Chief Complaint  Patient presents with   Annual Exam    Patient presents today for a physical exam.   Quality Metric Gaps    AWV, foot exam, eye exam, zoster vaccines, urine microalbumin   Discussed the use of AI scribe software for clinical note transcription with the patient, who gave verbal consent to proceed.  History of Present Illness Julie Schmidt is an 83 year old female with PMHx significant for DM II,PAD,peripheral neuropathy, PVC's, grade II diastolic dysfunction,HLD,and HTN here today with her daughter for her routine physical and follow up.  Last CPE: 12/02/22 She was last seen on 04/04/21.  She is active at home, walking daily, and maintains a diet that includes daily vegetable consumption. Her sleep pattern involves sleeping from 11 PM to 4 AM, waking to use the bathroom, and then sleeping again from 6 AM to 8:30 AM.  She does not see a dentist regularly. States that she has about ten teeth remaining and experiences pain sometimes. She sees her eye care provider regularly. She does not drink alcohol and no hx of tobacco use.  She has not had a mammogram since 2017.  Immunization History  Administered Date(s) Administered   Fluad Quad(high Dose 65+) 06/08/2019, 02/16/2020, 02/21/2021   Fluad Trivalent(High Dose 65+) 04/07/2023   Hepatitis A, Adult 04/03/2018   INFLUENZA, HIGH DOSE SEASONAL PF 04/23/2016   Influenza-Unspecified 03/29/2014   PFIZER(Purple Top)SARS-COV-2 Vaccination 06/28/2019, 07/19/2019, 03/03/2020   Pneumococcal Conjugate-13 01/30/2015, 03/13/2018   Pneumococcal Polysaccharide-23 06/07/2016   Tdap 04/02/2021   Health Maintenance  Topic Date Due   Diabetic kidney evaluation - Urine ACR  Never done   Zoster Vaccines- Shingrix (1 of 2) Never done   HEMOGLOBIN A1C  06/03/2023   Diabetic kidney evaluation - eGFR measurement  12/02/2023   Medicare Annual Wellness (AWV)  01/02/2024   Influenza Vaccine  01/09/2024   OPHTHALMOLOGY EXAM  03/10/2024    COVID-19 Vaccine (4 - 2025-26 season) 06/09/2024 (Originally 02/09/2024)   FOOT EXAM  04/21/2025   DTaP/Tdap/Td (2 - Td or Tdap) 04/03/2031   Pneumococcal Vaccine: 50+ Years  Completed   DEXA SCAN  Completed   Meningococcal B Vaccine  Aged Out   Hepatitis C Screening  Discontinued   [place] to the country 2 [place] ago, has been [place] 6 [place] or so. she  is not taking duloxetine  or pantoprazole  due to issues with obtaining refills while traveling.  GERD:Daily heartburn. She was on Pantoprazole  40 mg daily.  She has not been checking her blood pressure or blood sugars at home recently.  Diabetes Mellitus II: Dx'ed 5+ years ago. She is not checking BS's. She is on non pharmacologic treatment. Last eye exam over a year ago.  + Burning feet sensation and left great toe pain, both worse since she has not been on duloxetine  30 mg.  Lab Results  Component Value Date   HGBA1C 6.1 12/02/2022   Hyperlipidemia: Currently she is not on statin medication.  States that she discontinued pravastatin  due to side effects. Lab Results  Component Value Date   CHOL 183 12/02/2022   HDL 47.10 12/02/2022   LDLCALC 113 (H) 12/02/2022   TRIG 113.0 12/02/2022   CHOLHDL 4 12/02/2022   Hypertension: Currently she is on amlodipine  5 mg daily, which she will obtain [place]. She does not check BP.  Lab Results  Component Value Date   CREATININE 0.72 12/02/2022   BUN 11 12/02/2022   NA 142 12/02/2022   K  3.7 12/02/2022   CL 104 12/02/2022   CO2 30 12/02/2022   Osteoporosis: Not on pharmacologic treatment.  Review of Systems  Constitutional:  Negative for activity change, appetite change and fever.  HENT:  Negative for mouth sores, sore throat and trouble swallowing.   Eyes:  Negative for redness and visual disturbance.  Respiratory:  Negative for cough, shortness of breath and wheezing.   Cardiovascular:  Negative for chest pain and leg swelling.  Gastrointestinal:  Negative  for abdominal pain, nausea and vomiting.  Endocrine: Negative for cold intolerance, heat intolerance, polydipsia, polyphagia and polyuria.  Genitourinary:  Negative for decreased urine volume, dysuria and hematuria.  Musculoskeletal:  Positive for arthralgias. Negative for gait problem.  Skin:  Negative for color change and rash.  Allergic/Immunologic: Negative for environmental allergies.  Neurological:  Negative for syncope, weakness and headaches.  Hematological:  Negative for adenopathy. Does not bruise/bleed easily.  Psychiatric/Behavioral:  Negative for confusion and hallucinations.   All other systems reviewed and are negative.  Current Outpatient Medications on File Prior to Visit  Medication Sig Dispense Refill   Accu-Chek Softclix Lancets lancets Use to check blood sugar daily. Dx:e11.49 100 each 12   aspirin  81 MG tablet Take 81 mg by mouth daily.     Blood Glucose Monitoring Suppl (ACCU-CHEK AVIVA CONNECT) w/Device KIT 1 Device by Does not apply route daily. 1 kit 0   Blood Glucose Monitoring Suppl (ACCU-CHEK GUIDE ME) w/Device KIT Use to check blood sugars daily. 1 kit 0   Continuous Blood Gluc Receiver (DEXCOM G6 RECEIVER) DEVI Use to check blood sugars up to 3 times daily. 1 each 1   dorzolamide-timolol (COSOPT) 22.3-6.8 MG/ML ophthalmic solution Place 1 drop into both eyes 2 (two) times daily.     glucose blood (ACCU-CHEK GUIDE) test strip Use to check blood sugars daily. Dx:e11.49 100 each 12   ibuprofen (ADVIL,MOTRIN) 200 MG tablet Take 200 mg by mouth every 6 (six) hours as needed.     Lancets Misc. (ACCU-CHEK SOFTCLIX LANCET DEV) KIT 1 Device by Does not apply route daily. 100 kit 3   latanoprost (XALATAN) 0.005 % ophthalmic solution 1 drop at bedtime.     polyethylene glycol powder (GLYCOLAX /MIRALAX ) powder MIX 255 GRAM PO ONCE FOR 1 DOSE  0   No current facility-administered medications on file prior to visit.   Past Medical History:  Diagnosis Date   Arthritis     knees, back   Diabetes mellitus    Headache    Hypertension    Past Surgical History:  Procedure Laterality Date   ABDOMINAL HYSTERECTOMY     APPENDECTOMY     BRAIN SURGERY  05/19/2014   Craniotomy for meningioma   CARDIAC CATHETERIZATION N/A 11/25/2014   Procedure: Left Heart Cath and Coronary Angiography;  Surgeon: Oneil JAYSON Parchment, MD;  Location: [facility];  Service: Cardiovascular;  Laterality: N/A;   CESAREAN SECTION     CRANIOTOMY Left 05/19/2014   Procedure: CRANIOTOMY TUMOR EXCISION with Curve;  Surgeon: Fairy Levels, MD;  Location: [facility]URO [facility];  Service: Neurosurgery;  Laterality: Left;  Left frontal craniotomy for meningioma with brain lab    Allergies  Allergen Reactions   Norvasc  [Amlodipine ] Other (See Comments)    Dizziness and Chest pains at night when lies down   Oxycodone -Acetaminophen  Nausea And Vomiting    Family History  Problem Relation Age of Onset   Cancer Father    Social History   Socioeconomic History   Marital status:  Widowed    Spouse name: Not on file   Number of children: Not on file   Years of education: Not on file   Highest education level: Not on file  Occupational History   Not on file  Tobacco Use   Smoking status: Never   Smokeless tobacco: Never  Substance and Sexual Activity   Alcohol use: No    Alcohol/week: 0.0 standard drinks of alcohol   Drug use: No   Sexual activity: Not on file  Other Topics Concern   Not on file  Social History Narrative   Not on file   Social Drivers of Health   Financial Resource Strain: Low Risk  (01/08/2021)   Overall Financial Resource Strain (CARDIA)    Difficulty of Paying Living Expenses: Not hard at all  Food Insecurity: Unknown (01/02/2023)   Hunger Vital Sign    Worried About Running Out of Food in the Last Year: Never true    Ran Out of Food in the Last Year: Patient unable to answer  Transportation Needs: No Transportation Needs (01/02/2023)   PRAPARE - Doctor, General Practice (Medical): No    Lack of Transportation (Non-Medical): No  Physical Activity: Insufficiently Active (01/08/2021)   Exercise Vital Sign    Days of Exercise per Week: 5 days    Minutes of Exercise per Session: 10 min  Stress: No Stress Concern Present (01/08/2021)   [employer]ational [employer] - Occupational Stress Questionnaire    Feeling of Stress : Not at all  Social Connections: Unknown (10/23/2021)   Received from [facility]   Social Network    Social Network: Not on file   Today's Vitals   04/21/24 1426  BP: 130/68  Pulse: 60  Resp: 16  Temp: 98.6 F (37 C)  Weight: 125 lb 12.8 oz (57.1 kg)  Height: 5' 1.2 (1.554 m)   Body mass index is 23.61 kg/m.  Wt Readings from Last 3 Encounters:  04/21/24 125 lb 12.8 oz (57.1 kg)  01/29/23 126 lb 8 oz (57.4 kg)  01/02/23 119 lb 6.1 oz (54.2 kg)   Physical Exam Vitals and nursing note reviewed.  Constitutional:      General: She is not in acute distress.    Appearance: She is well-developed.  HENT:     Head: Normocephalic and atraumatic.     Right Ear: Hearing, tympanic membrane, ear canal and external ear normal.     Left Ear: Hearing, tympanic membrane, ear canal and external ear normal.     Mouth/Throat:     Mouth: Mucous membranes are moist.     Pharynx: Oropharynx is clear. Uvula midline.  Eyes:     Conjunctiva/sclera: Conjunctivae normal.     Pupils: Pupils are equal, round, and reactive to light.  Neck:     Thyroid : No thyroid  mass or thyromegaly.  Cardiovascular:     Rate and Rhythm: Normal rate and regular rhythm.     Pulses:          Dorsalis pedis pulses are 2+ on the right side and 2+ on the left side.     Heart sounds: No murmur heard. Pulmonary:     Effort: Pulmonary effort is normal. No respiratory distress.     Breath sounds: Normal breath sounds.  Abdominal:     Palpations: Abdomen is soft. There is no hepatomegaly or mass.     Tenderness: There is no abdominal  tenderness.  Musculoskeletal:     Right  lower leg: No edema.     Left lower leg: No edema.     Comments: No signs of synovitis appreciated.  Lymphadenopathy:     Cervical: No cervical adenopathy.  Skin:    General: Skin is warm.     Findings: No erythema or rash.  Neurological:     General: No focal deficit present.     Mental Status: She is alert and oriented to person, place, and time.     Cranial Nerves: No cranial nerve deficit.     Coordination: Coordination normal.     Gait: Gait normal.     Deep Tendon Reflexes:     Reflex Scores:      Bicep reflexes are 2+ on the right side and 2+ on the left side.      Patellar reflexes are 2+ on the right side and 2+ on the left side. Psychiatric:        Mood and Affect: Mood and affect normal.    ASSESSMENT AND PLAN: Julie Schmidt was here today annual physical examination and follow up.  Orders Placed This Encounter  Procedures   Hemoglobin A1c   Comprehensive metabolic panel with GFR   Urine Microalbumin w/creat. ratio   Routine general medical examination at a health care facility Assessment & Plan: We discussed the importance of regular physical activity and healthy diet for prevention of chronic illness and/or complications. Preventive guidelines reviewed. Vaccination up to date. Ca++ and vit D supplementation to continue. Next CPE in a year.   Gastroesophageal reflux disease without esophagitis Assessment & Plan: Problem is not well-controlled since she ran out of PPI. Pantoprazole  40 mg daily to be resumed, to take before breakfast. Continue GERD precautions.  Orders: -     Pantoprazole  Sodium; Take 1 tablet (40 mg total) by mouth daily before breakfast.  Dispense: 90 tablet; Refill: 3  Polyneuropathy associated with underlying disease Assessment & Plan: Resume duloxetine  30 mg daily. Continue appropriate footcare.  Orders: -     DULoxetine  HCl; Take 1 capsule (30 mg total) by mouth daily.  Dispense: 90  capsule; Refill: 2  Localized osteoporosis without current pathological fracture Assessment & Plan: We reviewed last DEXA, which was done in 01/17/2023. We discussed treatment options, recommend Fosamax 70 mg weekly.  We discussed side effects, recommend dental exam before starting medication. Continue adequate amount of calcium  and vitamin D supplementation as well as fall preventions and regular physical activity.  Orders: -     Alendronate Sodium; Take 1 tablet (70 mg total) by mouth every 7 (seven) days. Take with a full glass of water on an empty stomach.  Dispense: 13 tablet; Refill: 2  Type 2 diabetes mellitus with neurological complications [facility]) Assessment & Plan: Problem has been well controlled with non pharmacologic treatment. Last hemoglobin A1c 6.1 in 11/2022. Further recommendation will be given according to hemoglobin A1c result. Appropriate footcare, annual eye exam, and dental preventive care recommended. F/U in 5-6 months  Orders: -     Hemoglobin A1c -     Microalbumin / creatinine urine ratio  Hyperlipidemia associated with type 2 diabetes mellitus (HCC) Assessment & Plan: She discontinued pravastatin  due to side effects, which she cannot remember. She agrees with trying rosuvastatin 5 mg daily. Continue low-fat diet.  Orders: -     Comprehensive metabolic panel with GFR -     Rosuvastatin Calcium ; Take 1 tablet (5 mg total) by mouth daily.  Dispense: 90 tablet; Refill: 3  Primary hypertension  Assessment & Plan: She reports that currently she is on amlodipine  5 mg daily. Continue low-salt diet. Eye exam is due.   Return in about 6 months (around 10/19/2024) for chronic problems.  Samar Venneman G. Devera Englander, MD  [facility]. [place] office.

## 2024-04-21 NOTE — Patient Instructions (Signed)
 A few things to remember from today's visit:  Localized osteoporosis without current pathological fracture - Plan: alendronate (FOSAMAX) 70 MG tablet  Gastroesophageal reflux disease without esophagitis - Plan: pantoprazole  (PROTONIX ) 40 MG tablet  Polyneuropathy associated with underlying disease, Chronic - Plan: DULoxetine  (CYMBALTA ) 30 MG capsule  Type 2 diabetes mellitus with neurological complications (HCC) - Plan: Hemoglobin A1c, Urine Microalbumin w/creat. ratio  Hyperlipidemia associated with type 2 diabetes mellitus (HCC) - Plan: Comprehensive metabolic panel with GFR, rosuvastatin (CRESTOR) 5 MG tablet Please have a dental exam before starting Fosamax.  If you need refills for medications you take chronically, please call your pharmacy. Do not use My Chart to request refills or for acute issues that need immediate attention. If you send a my chart message, it may take a few days to be addressed, specially if I am not in the office.  Please be sure medication list is accurate. If a new problem present, please set up appointment sooner than planned today.

## 2024-04-21 NOTE — Assessment & Plan Note (Signed)
We discussed the importance of regular physical activity and healthy diet for prevention of chronic illness and/or complications. Preventive guidelines reviewed. Vaccination up to date. Ca++ and vit D supplementation to continue. Next CPE in a year. 

## 2024-04-21 NOTE — Assessment & Plan Note (Signed)
 We reviewed last DEXA, which was done in 01/17/2023. We discussed treatment options, recommend Fosamax 70 mg weekly.  We discussed side effects, recommend dental exam before starting medication. Continue adequate amount of calcium  and vitamin D supplementation as well as fall preventions and regular physical activity.

## 2024-04-22 LAB — COMPREHENSIVE METABOLIC PANEL WITH GFR
ALT: 23 U/L (ref 0–35)
AST: 27 U/L (ref 0–37)
Albumin: 4.7 g/dL (ref 3.5–5.2)
Alkaline Phosphatase: 107 U/L (ref 39–117)
BUN: 14 mg/dL (ref 6–23)
CO2: 25 meq/L (ref 19–32)
Calcium: 9.5 mg/dL (ref 8.4–10.5)
Chloride: 102 meq/L (ref 96–112)
Creatinine, Ser: 0.68 mg/dL (ref 0.40–1.20)
GFR: 81.84 mL/min (ref >60.00)
Glucose, Bld: 78 mg/dL (ref 70–99)
Potassium: 3.9 meq/L (ref 3.5–5.1)
Sodium: 139 meq/L (ref 135–145)
Total Bilirubin: 0.4 mg/dL (ref 0.2–1.2)
Total Protein: 7.9 g/dL (ref 6.0–8.3)

## 2024-04-22 LAB — HEMOGLOBIN A1C: Hgb A1c MFr Bld: 6.3 % (ref 4.6–6.5)

## 2024-04-22 LAB — MICROALBUMIN / CREATININE URINE RATIO
Creatinine,U: 96.1 mg/dL
Microalb Creat Ratio: 17.2 mg/g (ref 0.0–30.0)
Microalb, Ur: 1.7 mg/dL (ref 0.0–1.9)

## 2024-04-27 ENCOUNTER — Ambulatory Visit: Payer: Self-pay | Admitting: Family Medicine

## 2024-05-03 ENCOUNTER — Other Ambulatory Visit: Payer: Self-pay | Admitting: Family Medicine

## 2024-05-03 DIAGNOSIS — Z1231 Encounter for screening mammogram for malignant neoplasm of breast: Secondary | ICD-10-CM

## 2024-05-12 DIAGNOSIS — Z23 Encounter for immunization: Secondary | ICD-10-CM

## 2024-05-12 NOTE — Addendum Note (Signed)
 Addended by: DIONISIO CAMELIA PARAS on: 05/12/2024 08:52 AM   Modules accepted: Orders

## 2024-05-12 NOTE — Progress Notes (Signed)
 Patient is in office today for a nurse visit for Influenza Immunization. Patient Injection was given in the  Left deltoid. Patient tolerated injection well.

## 2024-05-24 ENCOUNTER — Other Ambulatory Visit: Payer: Self-pay | Admitting: Family Medicine

## 2024-05-24 ENCOUNTER — Ambulatory Visit: Payer: Self-pay

## 2024-05-24 DIAGNOSIS — M816 Localized osteoporosis [Lequesne]: Secondary | ICD-10-CM

## 2024-05-24 MED ORDER — ALENDRONATE SODIUM 70 MG PO TABS: 70.0000 mg | ORAL_TABLET | ORAL | 2 refills | Status: DC

## 2024-05-24 NOTE — Telephone Encounter (Signed)
 Please advise patient.

## 2024-05-24 NOTE — Telephone Encounter (Signed)
 Copied from CRM #[id number]. Topic: Clinical - Refused Triage >> May 24, 2024  1:42 PM Donna BRAVO wrote: Patient/caller voiced complaints of Patient refused to speak with Nurse triage. patient stated the medication  alendronate  Sodium 70 MG tablet.  When patient takes this medication she is sick for 3 days, whole body hurts and chest pain. Patient is to take this Wednesday 05/26/24.She would like to speak with Dr Betty Jordan MD to know if she should continue to take the  medication.    .Declined transfer to triage.  Patient was informed they will receive a call back within 1 business day.  ----------------------------------------------------------------------- From previous Reason for Contact - Medication Question: Reason for CRM:

## 2024-05-24 NOTE — Telephone Encounter (Signed)
 FYI Only or Action Required?: FYI only for provider: appointment scheduled on 12/17.  Patient was last seen in primary care on 04/21/2024 by Jordan, [name], MD.  Called Nurse Triage reporting Medication Reaction.  Symptoms began a week ago.  Interventions attempted: OTC medications: Advil.  Symptoms are: gradually improving.  Triage Disposition: See Physician Within 24 Hours  Patient/caregiver understands and will follow disposition?: Yes  Copied from CRM #[id number]. Topic: Clinical - Red Word Triage >> May 24, 2024  1:10 PM Sasha M wrote: Red Word that prompted transfer to Nurse Triage: Pt called in due to medicine making her sick with chest and body pains all over to the point that she cannot move or do anything. Pt call got disconnected, I did call back and let her know a nurse would call back asap. Reason for Disposition  [1] Chest pain lasts > 5 minutes AND [2] occurred > 3 days ago (72 hours) AND [3] NO chest pain or cardiac symptoms now  Answer Assessment - Initial Assessment Questions Reaction to Fosamax - recently started medication. For 3 days after initiating the medication, whole body pain that went into her chest. Denies SOB, Dizziness,   2 advil and pain is gone, 3 days in a row  Advised not to take the medication this week. Appt made for Weds when she is supposed to take it. Will discuss with doctor  1. LOCATION: Where does it hurt?       Whole chest and whole body  2. RADIATION: Does the pain go anywhere else? (e.g., into neck, jaw, arms, back)     Body  3. ONSET: When did the chest pain begin? (Minutes, hours or days)      Rigth after starting fosamax  4. PATTERN: Does the pain come and go, or has it been constant since it started?  Does it get worse with exertion?      Started in the whole body then went to the chest 5. DURATION: How long does it last (e.g., seconds, minutes, hours)     Until she can take advil 6. SEVERITY: How bad is the pain?   (e.g., Scale 1-10; mild, moderate, or severe)     Whole body in pain  7. CARDIAC RISK FACTORS: Do you have any history of heart problems or risk factors for heart disease? (e.g., angina, prior heart attack; diabetes, high blood pressure, high cholesterol, smoker, or strong family history of heart disease)     HTN, PAD  8. PULMONARY RISK FACTORS: Do you have any history of lung disease?  (e.g., blood clots in lung, asthma, emphysema, birth control pills)     denies 9. CAUSE: What do you think is causing the chest pain?     Fosamax  10. OTHER SYMPTOMS: Do you have any other symptoms? (e.g., dizziness, nausea, vomiting, sweating, fever, difficulty breathing, cough)       Denies  Protocols used: Chest Pain-A-AH

## 2024-05-26 ENCOUNTER — Encounter: Payer: Self-pay | Admitting: Family Medicine

## 2024-05-26 ENCOUNTER — Ambulatory Visit: Admitting: Family Medicine

## 2024-05-26 VITALS — BP 144/88 | HR 63 | Temp 98.5°F | Resp 16 | Ht 61.0 in | Wt 121.0 lb

## 2024-05-26 DIAGNOSIS — I10 Essential (primary) hypertension: Secondary | ICD-10-CM

## 2024-05-26 DIAGNOSIS — D649 Anemia, unspecified: Secondary | ICD-10-CM

## 2024-05-26 DIAGNOSIS — I5189 Other ill-defined heart diseases: Secondary | ICD-10-CM | POA: Diagnosis not present

## 2024-05-26 DIAGNOSIS — M816 Localized osteoporosis [Lequesne]: Secondary | ICD-10-CM

## 2024-05-26 LAB — CBC
HCT: 34.4 % — ABNORMAL LOW (ref 36.0–46.0)
Hemoglobin: 11.5 g/dL — ABNORMAL LOW (ref 12.0–15.0)
MCHC: 33.5 g/dL (ref 30.0–36.0)
MCV: 90.2 fl (ref 78.0–100.0)
Platelets: 280 K/uL (ref 150.0–400.0)
RBC: 3.82 Mil/uL — ABNORMAL LOW (ref 3.87–5.11)
RDW: 13.4 % (ref 11.5–15.5)
WBC: 4.2 K/uL (ref 4.0–10.5)

## 2024-05-26 LAB — VITAMIN D 25 HYDROXY (VIT D DEFICIENCY, FRACTURES): VITD: 24.06 ng/mL — ABNORMAL LOW (ref 30.00–100.00)

## 2024-05-26 LAB — FERRITIN: Ferritin: 42.5 ng/mL (ref 10.0–291.0)

## 2024-05-26 LAB — IRON: Iron: 70 ug/dL (ref 42–145)

## 2024-05-26 MED ORDER — AMLODIPINE BESYLATE 5 MG PO TABS: 5.0000 mg | ORAL_TABLET | Freq: Every day | ORAL | 2 refills | Status: AC

## 2024-05-26 NOTE — Assessment & Plan Note (Addendum)
 She did try Fosamax  75 mg once and developed moderate to severe thoracic pain (chest and back), which has resolved. We discussed other pharmacologic options, she agrees with trying Prolia, we will need to start PA process.  We discussed some side effects.  Continue adequate calcium  and vitamin D  supplementation as well as regular physical activity and fall prevention. 25 OH vitamin D  checked today.

## 2024-05-26 NOTE — Progress Notes (Unsigned)
 ACUTE VISIT Chief Complaint  Patient presents with   Medication Problem    Having issues with Alendronate  causing chest pain for 3. Stopped taking 12/10 and chest pain is gone.    HPI: [name] is a 83 y.o. female, who is here today complaining of *** Discussed the use of AI scribe software for clinical note transcription with the patient, who gave verbal consent to proceed.  History of Present Illness Julie Schmidt is an 83 year old female PMHx significant for DM II,PAD,peripheral neuropathy, PVC's, grade II diastolic dysfunction,HLD,and HTN here today with her daughter to discuss options for osteoporosis treatment and HTN f/u.  She experienced sharp chest pain after taking Fosamax , affecting her entire chest, including the back and front. The exact timing of the onset of pain after taking the medication is unclear, but the chest pain has since resolved.  She is currently taking vitamin D  2000 units daily but does not take calcium  supplements. She consumes milk to meet her calcium  needs but does not eat spinach or other calcium -rich vegetables.  She has a history of high blood pressure and has not taken her amlodipine  for three to five days. She was previously taking amlodipine  10 mg. She has not been checking her blood pressure at home.  No chest pain, difficulty breathing, or palpitations during physical activity. She recalls seeing a heart doctor in 2022 due to chest pain and was advised to follow up in eight months.  Lab Results  Component Value Date   NA 139 04/21/2024   CL 102 04/21/2024   K 3.9 04/21/2024   CO2 25 04/21/2024   BUN 14 04/21/2024   CREATININE 0.68 04/21/2024   GFR 81.84 04/21/2024   CALCIUM  9.5 04/21/2024   ALBUMIN  4.7 04/21/2024   GLUCOSE 78 04/21/2024   Lab Results  Component Value Date   WBC 4.6 12/02/2022   HGB 11.2 (L) 12/02/2022   HCT 34.7 (L) 12/02/2022   MCV 92.9 12/02/2022   PLT 337.0 12/02/2022     Review of Systems See other  pertinent positives and negatives in HPI.  Medications Ordered Prior to Encounter[1]  Past Medical History:  Diagnosis Date   Arthritis    knees, back   Diabetes mellitus    Headache    Hypertension    Allergies[2]  Social History   Socioeconomic History   Marital status: Widowed    Spouse name: Not on file   Number of children: Not on file   Years of education: Not on file   Highest education level: Not on file  Occupational History   Not on file  Tobacco Use   Smoking status: Never   Smokeless tobacco: Never  Substance and Sexual Activity   Alcohol use: No    Alcohol/week: 0.0 standard drinks of alcohol   Drug use: No   Sexual activity: Not on file  Other Topics Concern   Not on file  Social History Narrative   Not on file   Social Drivers of Health   Tobacco Use: Low Risk (05/26/2024)   Patient History    Smoking Tobacco Use: Never    Smokeless Tobacco Use: Never    Passive Exposure: Not on file  Financial Resource Strain: Not on file  Food Insecurity: Unknown (01/02/2023)   Hunger Vital Sign    Worried About Running Out of Food in the Last Year: Never true    Ran Out of Food in the Last Year: Patient unable to answer  Transportation  Needs: No Transportation Needs (01/02/2023)   PRAPARE - Administrator, Civil Service (Medical): No    Lack of Transportation (Non-Medical): No  Physical Activity: Not on file  Stress: Not on file  Social Connections: Unknown (10/23/2021)   Received [facility]   Social Network    Social Network: Not on file  Depression (PHQ2-9): Low Risk (04/21/2024)   Depression (PHQ2-9)    PHQ-2 Score: 0  Alcohol Screen: Not on file  Housing: Medium Risk (01/02/2023)   Housing    Last Housing Risk Score: 1  Utilities: Not At Risk (01/02/2023)   AHC Utilities    Threatened with loss of utilities: No  Health Literacy: Inadequate Health Literacy (01/02/2023)   B1300 Health Literacy    Frequency of need for help with  medical instructions: Sometimes    Vitals:   05/26/24 1044  BP: (!) 144/88  Pulse: 63  Resp: 16  Temp: 98.5 F (36.9 C)  SpO2: 97%   Body mass index is 22.86 kg/m.  Physical Exam Vitals and nursing note reviewed.  Constitutional:      General: She is not in acute distress.    Appearance: She is well-developed.  HENT:     Head: Normocephalic and atraumatic.     Mouth/Throat:     Mouth: Mucous membranes are moist.     Pharynx: Oropharynx is clear.  Eyes:     Conjunctiva/sclera: Conjunctivae normal.  Cardiovascular:     Rate and Rhythm: Normal rate and regular rhythm.     Pulses:          Dorsalis pedis pulses are 2+ on the right side and 2+ on the left side.     Heart sounds: Murmur (SEM I/VI RUSB-LUSB) heard.  Pulmonary:     Effort: Pulmonary effort is normal. No respiratory distress.     Breath sounds: Normal breath sounds.  Abdominal:     Palpations: Abdomen is soft. There is no mass.     Tenderness: There is no abdominal tenderness.  Musculoskeletal:     Right lower leg: No edema.     Left lower leg: No edema.  Lymphadenopathy:     Cervical: No cervical adenopathy.  Skin:    General: Skin is warm.     Findings: No erythema or rash.  Neurological:     General: No focal deficit present.     Mental Status: She is alert and oriented to person, place, and time.     Gait: Gait normal.  Psychiatric:        Mood and Affect: Mood and affect normal.   ASSESSMENT AND PLAN:  Cristie was seen today for medication problem.  Diagnoses and all orders for this visit:  Diastolic dysfunction -     Ambulatory referral to Cardiology  Localized osteoporosis without current pathological fracture -     VITAMIN D  25 Hydroxy (Vit-D Deficiency, Fractures); Future -     VITAMIN D  25 Hydroxy (Vit-D Deficiency, Fractures)  Normocytic anemia -     CBC; Future -     Iron; Future -     Ferritin; Future -     Ferritin -     Iron -     CBC  Primary hypertension -     amLODipine   (NORVASC ) 5 MG tablet; Take 1 tablet (5 mg total) by mouth daily.    Orders Placed This Encounter  Procedures   VITAMIN D  25 Hydroxy (Vit-D Deficiency, Fractures)   CBC   Iron  Ferritin   Ambulatory referral to Cardiology    Primary hypertension BP mildly elevated today, she has not been on antihypertensive medication for about 4 days. Resume amlodipine  5 mg daily. Continue low-salt diet. Recommend monitoring BP at home.  Osteoporosis She did try Fosamax  75 mg once and developed moderate to severe thoracic pain (chest and back), which has resolved. We discussed other pharmacologic options, she agrees with trying Prolia, we will need to start PA process.  We discussed some side effects.  Continue adequate calcium  and vitamin D  supplementation as well as regular physical activity and fall prevention. 25 OH vitamin D  checked today.  Diastolic dysfunction She was evaluated by cardiologist in 2022, she was supposed to have a 8 months follow-up.  Return if symptoms worsen or fail to improve, for keep next appointment.  Kelie Gainey G. Perline Awe, MD  [facility]. [place] office.     [1]  Current Outpatient Medications on File Prior to Visit  Medication Sig Dispense Refill   Accu-Chek Softclix Lancets lancets Use to check blood sugar daily. Dx:e11.49 100 each 12   aspirin  81 MG tablet Take 81 mg by mouth daily.     Blood Glucose Monitoring Suppl (ACCU-CHEK AVIVA CONNECT) w/Device KIT 1 Device by Does not apply route daily. 1 kit 0   Blood Glucose Monitoring Suppl (ACCU-CHEK GUIDE ME) w/Device KIT Use to check blood sugars daily. 1 kit 0   Continuous Blood Gluc Receiver (DEXCOM G6 RECEIVER) DEVI Use to check blood sugars up to 3 times daily. 1 each 1   dorzolamide-timolol (COSOPT) 22.3-6.8 MG/ML ophthalmic solution Place 1 drop into both eyes 2 (two) times daily.     DULoxetine  (CYMBALTA ) 30 MG capsule Take 1 capsule (30 mg total) by mouth daily. 90 capsule 2   glucose blood  (ACCU-CHEK GUIDE) test strip Use to check blood sugars daily. Dx:e11.49 100 each 12   ibuprofen (ADVIL,MOTRIN) 200 MG tablet Take 200 mg by mouth every 6 (six) hours as needed.     Lancets Misc. (ACCU-CHEK SOFTCLIX LANCET DEV) KIT 1 Device by Does not apply route daily. 100 kit 3   latanoprost (XALATAN) 0.005 % ophthalmic solution 1 drop at bedtime.     pantoprazole  (PROTONIX ) 40 MG tablet Take 1 tablet (40 mg total) by mouth daily before breakfast. 90 tablet 3   polyethylene glycol powder (GLYCOLAX /MIRALAX ) powder MIX 255 GRAM PO ONCE FOR 1 DOSE  0   rosuvastatin  (CRESTOR ) 5 MG tablet Take 1 tablet (5 mg total) by mouth daily. 90 tablet 3   alendronate  (FOSAMAX ) 70 MG tablet Take 1 tablet (70 mg total) by mouth every 7 (seven) days. Take with a full glass of water on an empty stomach. (Patient not taking: Reported on 05/26/2024) 13 tablet 2   No current facility-administered medications on file prior to visit.  [2]  Allergies Allergen Reactions   Oxycodone -Acetaminophen  Nausea And Vomiting

## 2024-05-26 NOTE — Assessment & Plan Note (Signed)
 BP mildly elevated today, she has not been on antihypertensive medication for about 4 days. Resume amlodipine  5 mg daily. Continue low-salt diet. Recommend monitoring BP at home.

## 2024-05-26 NOTE — Patient Instructions (Addendum)
 A few things to remember from today's visit:  Diastolic dysfunction - Plan: Ambulatory referral to Cardiology  Localized osteoporosis without current pathological fracture - Plan: VITAMIN D  25 Hydroxy (Vit-D Deficiency, Fractures)  Normocytic anemia - Plan: CBC, Iron, Ferritin  Prolia will be started, we need for insurance to let us  know about coverage. Continue calcium  (diet or pill) 1200 mg daily and vit D.  If you need refills for medications you take chronically, please call your pharmacy. Do not use My Chart to request refills or for acute issues that need immediate attention. If you send a my chart message, it may take a few days to be addressed, specially if I am not in the office.  Please be sure medication list is accurate. If a new problem present, please set up appointment sooner than planned today.

## 2024-05-26 NOTE — Assessment & Plan Note (Signed)
 She was evaluated by cardiologist in 2022, she was supposed to have a 8 months follow-up.

## 2024-05-29 ENCOUNTER — Ambulatory Visit: Payer: Self-pay | Admitting: Family Medicine

## 2024-05-29 DIAGNOSIS — D649 Anemia, unspecified: Secondary | ICD-10-CM

## 2024-05-31 ENCOUNTER — Telehealth: Payer: Self-pay | Admitting: *Deleted

## 2024-05-31 ENCOUNTER — Other Ambulatory Visit: Payer: Self-pay

## 2024-05-31 ENCOUNTER — Other Ambulatory Visit (HOSPITAL_COMMUNITY): Payer: Self-pay

## 2024-05-31 ENCOUNTER — Telehealth: Payer: Self-pay

## 2024-05-31 DIAGNOSIS — M816 Localized osteoporosis [Lequesne]: Secondary | ICD-10-CM

## 2024-05-31 MED ORDER — DENOSUMAB 60 MG/ML ~~LOC~~ SOSY: 60.0000 mg | PREFILLED_SYRINGE | Freq: Once | SUBCUTANEOUS | Status: DC

## 2024-05-31 NOTE — Telephone Encounter (Signed)
 Called patient back and had to LVM

## 2024-05-31 NOTE — Telephone Encounter (Signed)
 LVMTRC

## 2024-05-31 NOTE — Telephone Encounter (Signed)
 Copied from CRM #[id number]. Topic: General - Other >> May 31, 2024 12:45 PM Rosina BIRCH wrote: Reason for CRM: patient returning a call to veronica [phone]

## 2024-05-31 NOTE — Telephone Encounter (Signed)
 Julie Schmidt

## 2024-05-31 NOTE — Telephone Encounter (Addendum)
 Pt ready for scheduling for PROLIA  on or after : 05/31/24  Option# 1: Buy/Bill (Office supplied medication)  Out-of-pocket cost due at time of clinic visit: $357  Number of injection/visits approved: 2  Primary: [employer] MEDICARE Prolia  co-insurance: 20% Admin fee co-insurance: 20%  Secondary: [place] MEDICAID Prolia  co-insurance: *UNDISCLOSED* Admin fee co-insurance:   Medical Benefit Details: Date Benefits were checked: 05/31/24 Deductible: NO/ Coinsurance: 20%/ Admin Fee: 20%  Prior Auth: APPROVED PA# [id number] Expiration Date: 05/31/24-05/31/25  # of doses approved: 2 ----------------------------------------------------------------------- Option# 2- Med Obtained from pharmacy: Prolia  is no longer preferred for pharmacy benefit. Jubbonti is now preferred. PRICING IS FOR JUBBONTI  Pharmacy benefit: Copay $0 (Paid to pharmacy) Admin Fee: 20% (Pay at clinic)  Prior Auth: N/A PA# Expiration Date:   # of doses approved:   If patient wants fill through the pharmacy benefit please send prescription to: ---, and include estimated need by date in rx notes. Pharmacy will ship medication directly to the office.  Patient NOT eligible for Prolia  Copay Card. Copay Card can make patient's cost as little as $25. Link to apply: [email or web address]://www.[email or web address].[email or web address]/copay  ** This summary of benefits is an estimation of the patient's out-of-pocket cost. Exact cost may very based on individual plan coverage. '

## 2024-05-31 NOTE — Telephone Encounter (Signed)
 Prolia  VOB initiated via [email or web address]  Next Prolia  inj DUE: NEW START

## 2024-05-31 NOTE — Telephone Encounter (Signed)
 Patient called me back - I wasn't available. Called the patient back had to LVM.

## 2024-06-01 ENCOUNTER — Ambulatory Visit
Admission: RE | Admit: 2024-06-01 | Discharge: 2024-06-01 | Disposition: A | Discharge status: Home / Self Care | Source: Ambulatory Visit | Attending: Family Medicine | Admitting: Family Medicine

## 2024-06-01 DIAGNOSIS — Z1231 Encounter for screening mammogram for malignant neoplasm of breast: Secondary | ICD-10-CM

## 2024-06-01 NOTE — Addendum Note (Signed)
 Addended by: VANICE LUCIENNE PARAS on: 06/01/2024 11:50 AM   Modules accepted: Orders

## 2024-06-01 NOTE — Telephone Encounter (Signed)
 Patient was informed of her lab results from Dr. Jordan. She stated she would like the referral to GI now. Order placed.   Patient stated she has not been taking Vit D 2000 U daily only a multi vitamin. I informed her to start back taking a Vitamin D  2000 U daily and we will recheck levels.  Patient voiced understanding and did not have any questions or concerns at this time.

## 2024-06-15 ENCOUNTER — Other Ambulatory Visit: Payer: Self-pay

## 2024-06-15 ENCOUNTER — Telehealth: Payer: Self-pay

## 2024-06-15 MED ORDER — DENOSUMAB-BBDZ 60 MG/ML ~~LOC~~ SOSY: 60.0000 mg | PREFILLED_SYRINGE | Freq: Once | SUBCUTANEOUS | Status: DC

## 2024-06-15 MED ORDER — DENOSUMAB-BBDZ 60 MG/ML ~~LOC~~ SOSY
60.0000 mg | PREFILLED_SYRINGE | Freq: Once | SUBCUTANEOUS | 0 refills | Status: AC
  Filled 2024-06-15: qty 1, 180d supply, fill #0

## 2024-06-15 NOTE — Progress Notes (Signed)
 Specialty Pharmacy Initial Fill Coordination Note  Julie Schmidt is a 84 y.o. female contacted today regarding initial fill of specialty medication(s) Denosumab -bbdz (JUBBONTI )   Patient requested Courier to Provider Office   Delivery date: 06/17/24   Verified address: [facility] [place]   [address]   Medication will be filled on: 06/16/24   Patient is aware of $1.60 copayment.

## 2024-06-15 NOTE — Progress Notes (Signed)
 See benefits inv encounter from 05/31/24. Copay is $1.60

## 2024-06-15 NOTE — Addendum Note (Signed)
 Addended by: DIONISIO COLLIE PARAS on: 06/15/2024 08:27 AM   Modules accepted: Orders

## 2024-06-15 NOTE — Addendum Note (Signed)
 Addended by: DIONISIO COLLIE PARAS on: 06/15/2024 09:41 AM   Modules accepted: Orders

## 2024-06-15 NOTE — Telephone Encounter (Signed)
 Opened in error

## 2024-06-18 NOTE — Telephone Encounter (Signed)
 It is ok to give Jubbonti  (denosumab ) instead Prolia . Thanks, BJ

## 2024-06-18 NOTE — Telephone Encounter (Signed)
 Patient approved for Prolia  at $357. Patient is approved at $0 foJubbonti  through Pharmacy benefit. Please advise if ok to change patient to Jubbonti .

## 2024-06-18 NOTE — Telephone Encounter (Signed)
 Patient is ready for scheduling. Will you please contact patient and schedule appointment for Jubbonti . There is a $0 copay for this patient.

## 2024-06-18 NOTE — Telephone Encounter (Signed)
 Patient has been sch for 06/21/24 at 230 pm.

## 2024-06-21 ENCOUNTER — Ambulatory Visit

## 2024-06-21 DIAGNOSIS — M816 Localized osteoporosis [Lequesne]: Secondary | ICD-10-CM

## 2024-06-21 MED ORDER — DENOSUMAB-BBDZ 60 MG/ML ~~LOC~~ SOSY
60.0000 mg | PREFILLED_SYRINGE | Freq: Once | SUBCUTANEOUS | Status: AC
  Administered 2024-06-21: 60 mg via SUBCUTANEOUS

## 2024-06-21 MED ORDER — DENOSUMAB-BBDZ 60 MG/ML ~~LOC~~ SOSY: 60.0000 mg | PREFILLED_SYRINGE | Freq: Once | SUBCUTANEOUS | Status: AC

## 2024-06-21 NOTE — Addendum Note (Signed)
 Addended by: DIONISIO COLLIE PARAS on: 06/21/2024 02:32 PM   Modules accepted: Orders

## 2024-06-21 NOTE — Progress Notes (Signed)
 Patient is in office today with daughter for a nurse visit for Jubbonti . Patient injection was given in the Right arm SQ. Patient tolerated injection well.   Per lab note from Dr. Jordan and phone note from her cma, advise pt to starts taking Vitamin D  2000 units daily and have it check at her next appt with Dr. Jordan in May. Pt verbalized understanding.

## 2024-10-19 ENCOUNTER — Ambulatory Visit: Admitting: Family Medicine
